# Patient Record
Sex: Female | Born: 1950 | Race: White | Hispanic: No | Smoking: Never smoker
Health system: Southern US, Academic
[De-identification: ages and names within clinical notes are randomized; demographics above are authoritative.]

## PROBLEM LIST (undated history)

## (undated) DIAGNOSIS — N200 Calculus of kidney: Secondary | ICD-10-CM

## (undated) DIAGNOSIS — M129 Arthropathy, unspecified: Secondary | ICD-10-CM

## (undated) DIAGNOSIS — F319 Bipolar disorder, unspecified: Secondary | ICD-10-CM

## (undated) DIAGNOSIS — F32A Depression, unspecified: Secondary | ICD-10-CM

## (undated) DIAGNOSIS — Z972 Presence of dental prosthetic device (complete) (partial): Secondary | ICD-10-CM

## (undated) DIAGNOSIS — K279 Peptic ulcer, site unspecified, unspecified as acute or chronic, without hemorrhage or perforation: Secondary | ICD-10-CM

## (undated) HISTORY — PX: COLECTOMY PARTIAL / TOTAL: SUR275

## (undated) HISTORY — PX: ABDOMINAL HYSTERECTOMY: SUR658

## (undated) HISTORY — PX: LITHOTRIPSY: SUR834

## (undated) HISTORY — PX: HX BREAST BIOPSY: SHX20

## (undated) HISTORY — PX: HX CYST REMOVAL: SHX22

## (undated) HISTORY — PX: ANKLE SURGERY: SHX546

## (undated) HISTORY — PX: ABDOMINAL HERNIA REPAIR: SHX539

## (undated) HISTORY — PX: HX CARPAL TUNNEL RELEASE: SHX101

---

## 2007-10-15 ENCOUNTER — Emergency Department (HOSPITAL_COMMUNITY): Payer: Self-pay | Admitting: EXTERNAL

## 2021-06-03 ENCOUNTER — Emergency Department
Admission: EM | Admit: 2021-06-03 | Discharge: 2021-06-03 | Disposition: A | Discharge status: Home / Self Care | Payer: Medicare Other

## 2021-06-03 ENCOUNTER — Other Ambulatory Visit: Payer: Self-pay

## 2021-06-03 ENCOUNTER — Encounter (HOSPITAL_BASED_OUTPATIENT_CLINIC_OR_DEPARTMENT_OTHER): Payer: Self-pay

## 2021-06-03 DIAGNOSIS — M5440 Lumbago with sciatica, unspecified side: Secondary | ICD-10-CM

## 2021-06-03 DIAGNOSIS — M5441 Lumbago with sciatica, right side: Secondary | ICD-10-CM | POA: Insufficient documentation

## 2021-06-03 DIAGNOSIS — G8929 Other chronic pain: Secondary | ICD-10-CM

## 2021-06-03 DIAGNOSIS — M5442 Lumbago with sciatica, left side: Secondary | ICD-10-CM

## 2021-06-03 HISTORY — DX: Peptic ulcer, site unspecified, unspecified as acute or chronic, without hemorrhage or perforation: K27.9

## 2021-06-03 HISTORY — DX: Bipolar disorder, unspecified: F31.9

## 2021-06-03 HISTORY — DX: Depression, unspecified: F32.A

## 2021-06-03 MED ORDER — TIZANIDINE 2 MG CAPSULE
2.0000 mg | ORAL_CAPSULE | Freq: Three times a day (TID) | ORAL | 0 refills | Status: DC | PRN
Start: 2021-06-03 — End: 2022-01-28

## 2021-06-03 MED ORDER — DEXAMETHASONE SODIUM PHOSPHATE (PF) 10 MG/ML INJECTION SOLUTION
6.0000 mg | INTRAMUSCULAR | Status: AC
Start: 2021-06-03 — End: 2021-06-03
  Administered 2021-06-03: 6 mg via INTRAMUSCULAR

## 2021-06-03 MED ORDER — DEXAMETHASONE SODIUM PHOSPHATE 4 MG/ML INJECTION SOLUTION
INTRAMUSCULAR | Status: AC
Start: 2021-06-03 — End: 2021-06-03
  Filled 2021-06-03: qty 3

## 2021-06-03 MED ORDER — KETOROLAC 30 MG/ML (1 ML) INJECTION SOLUTION
30.0000 mg | INTRAMUSCULAR | Status: AC
Start: 2021-06-03 — End: 2021-06-03
  Administered 2021-06-03: 30 mg via INTRAMUSCULAR

## 2021-06-03 MED ORDER — KETOROLAC 30 MG/ML (1 ML) INJECTION SOLUTION
INTRAMUSCULAR | Status: AC
Start: 2021-06-03 — End: 2021-06-03
  Filled 2021-06-03: qty 1

## 2021-06-03 NOTE — Discharge Instructions (Signed)
As we have discussed, follow-up with your PCP as needed.  Take Tylenol or ibuprofen every 4-6 hours as needed for pain.  Take prescription med as directed. If your symptoms get worse or no improvement, visit nearest ED.

## 2021-06-03 NOTE — ED Triage Notes (Signed)
x10 days patient has had lower back pain going down legs bilaterally. Reports talking motrin without any relief. Muscle in legs tight and painful. Reports falling x3 in the last five days r/t muscle tightness and discomfort. Patient also reported she has been out of her routine medication for over a month. She is waiting to get in with new dr on 06/23/21. Marland Kitchen

## 2021-06-03 NOTE — ED Nurses Note (Signed)
Patient was not in room at this time. NP has talked to patient about discharge plan. She did not get any paperwork.

## 2021-06-03 NOTE — ED Provider Notes (Signed)
[facility], [facility] Emergency Department  ED Primary Provider Note  History of Present Illness   Chief Complaint   Patient presents with   . Low Back Pain     TYMIA BUCHE is a 71 y.o. female who had concerns including Low Back Pain.  Arrival: The patient arrived by Car    A 70 years old female patient presents to the ED with a complaint of chronic back pain.  Patient states left-sided low back pain radiated to the bilateral knee and it is sharp pain.  Patient states it has been worse the last 5 days.  Patient states walking and standing make it worse and lying down make it better.  Patient refused have a x-ray.  Patient denies other symptoms or complaints at this time.  No acute distress at this time.        Review of Systems   Pertinent positive and negative ROS as per HPI.  Review of Systems   Constitutional: Negative.    HENT: Negative.    Eyes: Negative.    Respiratory: Negative.    Cardiovascular: Negative.    Gastrointestinal: Negative.    Genitourinary: Negative.    Musculoskeletal: Positive for back pain.   Skin: Negative.    Neurological: Negative.    Psychiatric/Behavioral: Negative.      Historical Data   History Reviewed This Encounter: Medical History  Surgical History  Family History  Social History      Physical Exam   ED Triage Vitals [06/03/21 1638]   BP (Non-Invasive) (!) 148/96   Heart Rate 88   Respiratory Rate 18   Temperature 36.8 C (98.3 F)   SpO2 97 %   Weight 90.7 kg (200 lb)   Height 1.575 m (5\' 2" )     Physical Exam  Vitals and nursing note reviewed.   Constitutional:       Appearance: Normal appearance. She is obese.   HENT:      Head: Normocephalic and atraumatic.   Eyes:      Extraocular Movements: Extraocular movements intact.      Pupils: Pupils are equal, round, and reactive to light.   Cardiovascular:      Rate and Rhythm: Normal rate.      Pulses: Normal pulses.      Heart sounds: Normal heart sounds.   Pulmonary:      Effort: Pulmonary  effort is normal.      Breath sounds: Normal breath sounds.   Abdominal:      General: Bowel sounds are normal.      Palpations: Abdomen is soft.   Musculoskeletal:         General: Tenderness present.      Cervical back: Normal range of motion and neck supple.      Comments: Mild tenderness on the left lower back   Skin:     General: Skin is warm and dry.   Neurological:      General: No focal deficit present.      Mental Status: She is alert and oriented to person, place, and time.   Psychiatric:         Mood and Affect: Mood normal.         Behavior: Behavior normal.       Patient Data   Labs Ordered/Reviewed - No data to display  No orders to display     Medical Decision Making        Medical Decision Making  Patient  has chronic low back pain radiates to bilateral knee.  Patient denies any injury.  Patient refused to have x-ray.  Patient probably lumbago with sciatic pain.  No acute abnormality at this time patient is stable and has normal vital signs.  Patient will have a muscle relaxer and NSAID at home.    Risk  Prescription drug management.               Medications Administered in the ED   ketorolac (TORADOL) 30 mg/mL injection (has no administration in time range)   dexamethasone (PF) 10 mg/mL injection (has no administration in time range)     Clinical Impression   Lumbago with sciatica (Primary)   Chronic low back pain with bilateral sciatica       Disposition: Discharged

## 2021-06-14 ENCOUNTER — Inpatient Hospital Stay
Admission: RE | Admit: 2021-06-14 | Discharge: 2021-06-20 | DRG: 885 | Disposition: A | Discharge status: Home / Self Care | Payer: Medicare Other | Source: Ambulatory Visit | Attending: Psychiatry | Admitting: Psychiatry

## 2021-06-14 ENCOUNTER — Encounter (HOSPITAL_PSYCHIATRIC): Payer: Self-pay

## 2021-06-14 ENCOUNTER — Encounter (HOSPITAL_BASED_OUTPATIENT_CLINIC_OR_DEPARTMENT_OTHER): Payer: Self-pay

## 2021-06-14 ENCOUNTER — Encounter (HOSPITAL_COMMUNITY): Payer: Self-pay

## 2021-06-14 ENCOUNTER — Other Ambulatory Visit: Payer: Self-pay

## 2021-06-14 ENCOUNTER — Encounter (HOSPITAL_PSYCHIATRIC): Payer: Self-pay | Admitting: Psychiatry

## 2021-06-14 ENCOUNTER — Emergency Department (EMERGENCY_DEPARTMENT_HOSPITAL)
Admission: EM | Admit: 2021-06-14 | Discharge: 2021-06-14 | Disposition: A | Discharge status: Other Acute Inpatient Hospital | Payer: Medicare Other | Source: Home / Self Care | Attending: Emergency Medicine | Admitting: Emergency Medicine

## 2021-06-14 ENCOUNTER — Emergency Department (EMERGENCY_DEPARTMENT_HOSPITAL): Payer: Medicare Other

## 2021-06-14 DIAGNOSIS — Z8711 Personal history of peptic ulcer disease: Secondary | ICD-10-CM

## 2021-06-14 DIAGNOSIS — W19XXXA Unspecified fall, initial encounter: Secondary | ICD-10-CM | POA: Insufficient documentation

## 2021-06-14 DIAGNOSIS — I10 Essential (primary) hypertension: Secondary | ICD-10-CM | POA: Diagnosis present

## 2021-06-14 DIAGNOSIS — K219 Gastro-esophageal reflux disease without esophagitis: Secondary | ICD-10-CM | POA: Diagnosis present

## 2021-06-14 DIAGNOSIS — M25562 Pain in left knee: Secondary | ICD-10-CM | POA: Insufficient documentation

## 2021-06-14 DIAGNOSIS — M1990 Unspecified osteoarthritis, unspecified site: Secondary | ICD-10-CM

## 2021-06-14 DIAGNOSIS — Z9151 Personal history of suicidal behavior: Secondary | ICD-10-CM | POA: Insufficient documentation

## 2021-06-14 DIAGNOSIS — R45851 Suicidal ideations: Secondary | ICD-10-CM | POA: Diagnosis present

## 2021-06-14 DIAGNOSIS — M25561 Pain in right knee: Secondary | ICD-10-CM | POA: Insufficient documentation

## 2021-06-14 DIAGNOSIS — R9431 Abnormal electrocardiogram [ECG] [EKG]: Secondary | ICD-10-CM | POA: Insufficient documentation

## 2021-06-14 DIAGNOSIS — R296 Repeated falls: Secondary | ICD-10-CM

## 2021-06-14 DIAGNOSIS — G8929 Other chronic pain: Secondary | ICD-10-CM | POA: Insufficient documentation

## 2021-06-14 DIAGNOSIS — Z62820 Parent-biological child conflict: Secondary | ICD-10-CM | POA: Diagnosis present

## 2021-06-14 DIAGNOSIS — R946 Abnormal results of thyroid function studies: Secondary | ICD-10-CM | POA: Diagnosis present

## 2021-06-14 DIAGNOSIS — Z9181 History of falling: Secondary | ICD-10-CM

## 2021-06-14 DIAGNOSIS — M069 Rheumatoid arthritis, unspecified: Secondary | ICD-10-CM | POA: Diagnosis present

## 2021-06-14 DIAGNOSIS — M25551 Pain in right hip: Secondary | ICD-10-CM | POA: Insufficient documentation

## 2021-06-14 DIAGNOSIS — Z79890 Hormone replacement therapy (postmenopausal): Secondary | ICD-10-CM

## 2021-06-14 DIAGNOSIS — S060XAA Concussion with loss of consciousness status unknown, initial encounter: Secondary | ICD-10-CM | POA: Insufficient documentation

## 2021-06-14 DIAGNOSIS — Z818 Family history of other mental and behavioral disorders: Secondary | ICD-10-CM

## 2021-06-14 DIAGNOSIS — M25552 Pain in left hip: Secondary | ICD-10-CM | POA: Insufficient documentation

## 2021-06-14 DIAGNOSIS — Z658 Other specified problems related to psychosocial circumstances: Secondary | ICD-10-CM

## 2021-06-14 DIAGNOSIS — F319 Bipolar disorder, unspecified: Principal | ICD-10-CM | POA: Diagnosis present

## 2021-06-14 DIAGNOSIS — F32A Depression, unspecified: Secondary | ICD-10-CM | POA: Insufficient documentation

## 2021-06-14 DIAGNOSIS — S0083XA Contusion of other part of head, initial encounter: Secondary | ICD-10-CM

## 2021-06-14 DIAGNOSIS — Z9049 Acquired absence of other specified parts of digestive tract: Secondary | ICD-10-CM

## 2021-06-14 DIAGNOSIS — F411 Generalized anxiety disorder: Secondary | ICD-10-CM

## 2021-06-14 DIAGNOSIS — S0990XA Unspecified injury of head, initial encounter: Secondary | ICD-10-CM

## 2021-06-14 DIAGNOSIS — K279 Peptic ulcer, site unspecified, unspecified as acute or chronic, without hemorrhage or perforation: Secondary | ICD-10-CM

## 2021-06-14 DIAGNOSIS — Z881 Allergy status to other antibiotic agents status: Secondary | ICD-10-CM

## 2021-06-14 DIAGNOSIS — F332 Major depressive disorder, recurrent severe without psychotic features: Secondary | ICD-10-CM

## 2021-06-14 DIAGNOSIS — Z87892 Personal history of anaphylaxis: Secondary | ICD-10-CM

## 2021-06-14 HISTORY — DX: Presence of dental prosthetic device (complete) (partial): Z97.2

## 2021-06-14 HISTORY — DX: Arthropathy, unspecified: M12.9

## 2021-06-14 LAB — CBC WITH DIFF
BASOPHIL #: 0.04 10*3/uL (ref 0.00–2.50)
BASOPHIL %: 1 % (ref 0–3)
EOSINOPHIL #: 0.47 10*3/uL (ref 0.00–2.40)
EOSINOPHIL %: 5 % (ref 0–7)
HCT: 42.2 % (ref 37.0–47.0)
HGB: 14.6 g/dL (ref 12.5–16.0)
LYMPHOCYTE #: 2.03 10*3/uL — ABNORMAL LOW (ref 2.10–11.00)
LYMPHOCYTE %: 24 % — ABNORMAL LOW (ref 25–45)
MCH: 32.1 pg — ABNORMAL HIGH (ref 27.0–32.0)
MCHC: 34.6 g/dL (ref 32.0–36.0)
MCV: 93 fL (ref 78.0–99.0)
MONOCYTE #: 0.89 10*3/uL (ref 0.00–4.10)
MONOCYTE %: 10 % (ref 0–12)
MPV: 6.9 fL — ABNORMAL LOW (ref 7.4–10.4)
NEUTROPHIL #: 5.21 10*3/uL (ref 4.10–29.00)
NEUTROPHIL %: 60 % (ref 40–76)
PLATELETS: 262 10*3/uL (ref 140–440)
RBC: 4.54 10*6/uL (ref 4.20–5.40)
RDW: 15.9 % — ABNORMAL HIGH (ref 11.6–14.8)
WBC: 8.6 10*3/uL (ref 4.0–10.5)

## 2021-06-14 LAB — URINALYSIS, MACRO/MICRO
BILIRUBIN: NEGATIVE mg/dL
BLOOD: NEGATIVE mg/dL
GLUCOSE: NEGATIVE mg/dL
KETONES: NEGATIVE mg/dL
LEUKOCYTES: NEGATIVE WBCs/uL
NITRITE: NEGATIVE
PH: 6 (ref 4.6–8.0)
PROTEIN: NEGATIVE mg/dL
SPECIFIC GRAVITY: <=1.005 (ref 1.003–1.035)
UROBILINOGEN: 0.2 mg/dL (ref 0.2–1.0)

## 2021-06-14 LAB — ECG 12 LEAD
Atrial Rate: 64 {beats}/min
Calculated P Axis: 7 degrees
Calculated R Axis: -19 degrees
Calculated T Axis: -4 degrees
PR Interval: 164 ms
QRS Duration: 86 ms
QT Interval: 434 ms
QTC Calculation: 447 ms
Ventricular rate: 64 {beats}/min

## 2021-06-14 LAB — COMPREHENSIVE METABOLIC PANEL, NON-FASTING
ALBUMIN/GLOBULIN RATIO: 0.9 (ref 0.8–1.4)
ALBUMIN: 3.8 g/dL (ref 3.4–5.0)
ALKALINE PHOSPHATASE: 82 U/L (ref 46–116)
ALT (SGPT): 15 U/L (ref <=78)
ANION GAP: 13 mmol/L (ref 10–20)
AST (SGOT): 22 U/L (ref 15–37)
BILIRUBIN TOTAL: 0.5 mg/dL (ref 0.2–1.0)
BUN/CREA RATIO: 14
BUN: 13 mg/dL (ref 7–18)
CALCIUM, CORRECTED: 9.5 mg/dL
CALCIUM: 9.3 mg/dL (ref 8.5–10.1)
CHLORIDE: 104 mmol/L (ref 98–107)
CO2 TOTAL: 26 mmol/L (ref 21–32)
CREATININE: 0.96 mg/dL (ref 0.55–1.02)
ESTIMATED GFR: 64 mL/min/{1.73_m2} (ref >59)
GLOBULIN: 4.1
GLUCOSE: 100 mg/dL (ref 74–106)
OSMOLALITY, CALCULATED: 285 mOsm/kg (ref 270–290)
POTASSIUM: 3.4 mmol/L — ABNORMAL LOW (ref 3.5–5.1)
PROTEIN TOTAL: 7.9 g/dL (ref 6.4–8.2)
SODIUM: 143 mmol/L (ref 136–145)

## 2021-06-14 LAB — URINE DRUG SCREEN
AMPHETAMINES URINE: NEGATIVE
BARBITURATES URINE: NEGATIVE
BENZODIAZEPINES URINE: NEGATIVE
CANNABINOIDS URINE: NEGATIVE
COCAINE METABOLITES URINE: NEGATIVE
METHADONE URINE: NEGATIVE
OPIATES URINE: NEGATIVE
PCP URINE: NEGATIVE

## 2021-06-14 LAB — SALICYLATE ACID LEVEL: SALICYLATE LEVEL: 0 mg/dL — ABNORMAL LOW (ref 3–20)

## 2021-06-14 LAB — ACETAMINOPHEN LEVEL: ACETAMINOPHEN LEVEL: 0 ug/mL

## 2021-06-14 LAB — THYROID STIMULATING HORMONE (SENSITIVE TSH): TSH: 7.3 u[IU]/mL — ABNORMAL HIGH (ref 0.358–3.740)

## 2021-06-14 LAB — ETHANOL, SERUM: ETHANOL: 1 mg/dL — ABNORMAL LOW (ref 3–202)

## 2021-06-14 MED ORDER — ALPRAZOLAM 0.5 MG TABLET
0.5000 mg | ORAL_TABLET | Freq: Two times a day (BID) | ORAL | Status: DC
Start: 2021-06-14 — End: 2021-06-20
  Administered 2021-06-14 – 2021-06-20 (×12): 0.5 mg via ORAL
  Filled 2021-06-14 (×12): qty 1

## 2021-06-14 MED ORDER — KETOROLAC 30 MG/ML (1 ML) INJECTION SOLUTION
15.0000 mg | INTRAMUSCULAR | Status: AC
Start: 2021-06-14 — End: 2021-06-14
  Administered 2021-06-14: 15 mg via INTRAMUSCULAR

## 2021-06-14 MED ORDER — BACLOFEN 10 MG TABLET
5.0000 mg | ORAL_TABLET | Freq: Three times a day (TID) | ORAL | Status: DC | PRN
Start: 2021-06-14 — End: 2021-06-20
  Administered 2021-06-14 – 2021-06-19 (×8): 5 mg via ORAL
  Filled 2021-06-14 (×8): qty 1

## 2021-06-14 MED ORDER — TRAZODONE 50 MG TABLET
50.0000 mg | ORAL_TABLET | Freq: Every evening | ORAL | Status: DC | PRN
Start: 2021-06-14 — End: 2021-06-20
  Administered 2021-06-14 – 2021-06-19 (×9): 50 mg via ORAL
  Filled 2021-06-14 (×9): qty 1

## 2021-06-14 MED ORDER — MAGNESIUM HYDROXIDE 2,400 MG/10 ML ORAL SUSPENSION
10.0000 mL | Freq: Every evening | ORAL | Status: DC | PRN
Start: 2021-06-14 — End: 2021-06-20

## 2021-06-14 MED ORDER — ALUMINUM-MAG HYDROXIDE-SIMETHICONE 200 MG-200 MG-20 MG/5 ML ORAL SUSP
30.0000 mL | ORAL | Status: DC | PRN
Start: 2021-06-14 — End: 2021-06-20

## 2021-06-14 MED ORDER — LEVOTHYROXINE 50 MCG TABLET
50.0000 ug | ORAL_TABLET | Freq: Every morning | ORAL | Status: DC
Start: 2021-06-14 — End: 2021-06-20
  Administered 2021-06-14 – 2021-06-20 (×7): 50 ug via ORAL
  Filled 2021-06-14 (×7): qty 1

## 2021-06-14 MED ORDER — KETOROLAC 30 MG/ML (1 ML) INJECTION SOLUTION
INTRAMUSCULAR | Status: AC
Start: 2021-06-14 — End: 2021-06-14
  Filled 2021-06-14: qty 1

## 2021-06-14 MED ORDER — TIZANIDINE 2 MG CAPSULE
2.0000 mg | ORAL_CAPSULE | Freq: Three times a day (TID) | ORAL | Status: DC | PRN
Start: 2021-06-14 — End: 2021-06-14

## 2021-06-14 MED ORDER — CITALOPRAM 10 MG TABLET
10.0000 mg | ORAL_TABLET | Freq: Every day | ORAL | Status: DC
Start: 2021-06-14 — End: 2021-06-20
  Administered 2021-06-14 – 2021-06-20 (×7): 10 mg via ORAL
  Filled 2021-06-14 (×7): qty 1

## 2021-06-14 MED ORDER — ACETAMINOPHEN 325 MG TABLET
650.0000 mg | ORAL_TABLET | ORAL | Status: DC | PRN
Start: 2021-06-14 — End: 2021-06-20
  Administered 2021-06-14 – 2021-06-19 (×7): 650 mg via ORAL
  Filled 2021-06-14 (×8): qty 2

## 2021-06-14 NOTE — Care Plan (Signed)
Care plan initiated.

## 2021-06-14 NOTE — Nurses Notes (Signed)
Patient arrived to GERI Unit at this time. Admission assessment completed. Patient noted to have crying spells during admission assessment, stating depression is a 9-10/10. Patient rated anxiety 8/10. Patient stated she has had an increase in suicidal thoughts, but patient denies current suicidal thoughts. Patient denies HI/AVH. Patient reports a recent increase in falls. Yellowing bruises noted to face above right eye, left cheek, and left forearm. See avatar for precise locations. Patient oriented to unit and rules. Q74min and PRN visual safety checks in progress.

## 2021-06-14 NOTE — Behavioral Health (Signed)
[facility]      Biopsychosocial Assessment    Patient: Brooke Maxwell, Brooke Maxwell, 70 y.o., Brooke Maxwell, female  Date of Birth:  [birth year]  MRN: [id number]  Room/Bed: 216/A  Medical Record #: [id number]  Date of Admission: 06/14/2021  Admission Type: Voluntary  Status:  Voluntary    Medical Diagnosis and History:  Patient Active Problem List   Diagnosis   . Bipolar 1 disorder (CMS HCC)     Past Medical History:   Diagnosis Date   . Arthropathy    . Bipolar disorder, unspecified (CMS HCC)    . Depression    . PUD (peptic ulcer disease)    . Wears dentures          Past Surgical History:   Procedure Laterality Date   . ABDOMINAL HERNIA REPAIR     . ABDOMINAL HYSTERECTOMY     . ANKLE SURGERY Right    . COLECTOMY PARTIAL / TOTAL     . HX BREAST BIOPSY Left    . HX CARPAL TUNNEL RELEASE Bilateral    . HX CYST REMOVAL      Cyst removed from neck   . LITHOTRIPSY               Date Performed: 06/14/2021    Attending Physician:  Dr. Farris HasJeffry Gee, MD/ Jeri CosIra T. Hyman HopesWebb, [place]     Informant: Information was gathered from the patient and clinical record    Chief Complaint / Problems:   Patient is a 71 y.o. White female who is divorced and single.  Brooke Maxwell is being admitted voluntarily.   The patient is english speaking and no communication devices were needed during this assessment.  The patient was admitted for suicidal ideations, increased depression and anxiety with an inability to cope.  The patient has a long history of mental health issues but has had no treatment since moving to [place] 3 years ago with her daughter.    Severity of Stressors and Maladaptive Behavior identified:    Brooke Maxwell rates her depression an 8 and her anxiety a 9 on a scale of 1-10 with 10 being the worst.  The patient reports she does have panic attacks but denies feeling paranoid.  The patient reports her thoughts race constantly, her sleep is very poor because she wakes frequently and her appetite is poor.  The patient is still endorsing  suicidal ideations stating she wishes she would go to sleep and just not wake up.  Patient currently denies homicidal ideations, auditory, visual and tactile hallucinations.  The patient has not established with any outpatient psychiatric provider.  The only provider the patient currently has is her primary care physician a Dr. Noe GensPeters at [facility] in [place].  The patient was made aware of her right to participate in treatment.      CURRENT FAMILY SITUATION AND PEER GROUP  The patient currently lives in [place], [place].  The patient currently lives in her daughter's residence where it is the patient, her daughter and her 2 grandchildren.  The patient does not identify with any peer groups as she states the only family she has is her daughter currently.  The patient states she does not have any friends.  The patient has been married twice in the past.  The patient reports her 1st marriage was for 11 years in her 2nd marriage was for 25 years.  The patient did not comment  on the quality of the relationships.  The patient currently denies any adult abuse, neglect or exploitation and denies abusing anyone else.  However when the financials were discussed with the patient she states she only gets her disability check and her daughter takes the entire check to pay the bills at the residence.  The patient also states her daughter frequently tells the patient she needs to get a part-time job to bring in more money for the household.  So there possibly could be some financial exploitation of the patient.  The patient reports she only has 1 child which is her adult daughter she currently lives with.  The patient states she is trying to maintain the relationship with her daughter but it is significantly strained.  Patient reports her mother and father both deceased.  The patient reports she is the youngest that she has 3 older brothers.  The patient reports 2 of  her brothers are deceased.  The patient reports she does not have a relationship with her only brother that is currently living.  The social, ethnic, cultural, emotional and/or health factors currently stressing the patient and her family are:  The turmoil in the household, the financial situation in the patient's inability to cope with her worsening mental health issues and physical health issues.    Sociocultural Environment and Severity of Stress.   The patient reports that she has no religious preference.  The patient's has level of education is she graduated high school and has 1 year of college.  The patient has no history of [employer].  The patient is currently retired and disabled.  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  The patient rates her financial status is very difficult because she has a fixed income of her disability, and she states her daughter takes her entire check to pay the bills at the daughter's residence.  The patient states she does not see 1 penny of her income because her daughter takes at all, and her daughter frequently tells her to get a part-time job during more income into the household.  The patient does not have access to community resources as she states she is not eligible for Medicaid or food stamps.  The patient does receive Medicare.  The patient has no support system and is dependent upon her daughter who she lives with which is a significant barrier for her.  The patient denies any history of gambling, current legal charges or any legal history.    Tobacco/Chemical Substance History  The patient denies using alcohol, substances or tobacco products.  Nothing in the patient's record indicates a problem with substances.  Therefore, no further intervention is needed at this time.  Please see the dictated H&P for full listing of the patient's medical issues.    Substance Abuse Treatment History:  The  patient denies any history of seizures, blackouts or DTs.  The patient denies any history of chemical dependence treatment, rehab her support groups.    Developmental History:  The patient was born in [place], [place].  The patient rates her developmental memories of childhood is positive.  The patient denies any history of physical, psychological or sexual abuse as a child.  The patient denies any unusual childhood illnesses, injuries or diseases.    Activity Assessment and Discharge Recommendations:  The patient reports she currently has no hobbies or interests.  The patient reports her level of activity is very limited due  to physical impairment and pain as well as no interest or joy in things.  The patient struggles with depression and anxiety and would benefit from activities that would improve her mood and affect.  The patient also struggles with suicidal ideations and needs activities that would provide her with positive outlets for her feelings and emotions.  The patient also has pain issues and would benefit from activities that would help direct her thoughts away from her pain.    Primary Therapist Assessment:  The patient was rational and coherent during the assessment.  There are no overt signs of psychosis.  The patient was admitted for suicidal ideations and is still endorsing thoughts of just wanting to go to sleep and not wanting to wake up.  The patient cried during her assessment.  The patient denies homicidal ideations, auditory, visual and tactile hallucinations.  The patient's mood and affect are depressed and flat.  The patient's behavior is cooperative but she was very tearful and cried.  The patient is dressed in street clothes but appears disheveled and unkept.  The patient has also had issues with falling recently and has several bruises on her face from recent falls.  The patient also struggles with significant arthritis and pain issues which limit her mobility from time to times.  The  patient's tone of voice was soft.  The patient made good eye contact during the assessment.  The patient's estimated intelligence level is average.  The patient's insight and judgment are fair.  There is no issues with recent or remote memories.  There is no evidence of historical psychosis or delusional thinking.  Patient self-esteem and ego strength are poor.  The patient's strength are that she does have her own insurance and her only income, but she currently does not have access to her actual income because her daughter takes it.  The patient's weaknesses are support system, coping skills, financial situation, housing situation and family turmoil.    Plan:  While on the unit, the patient will be involved in individual, group, activity, psychoactive and milieu therapies in order to reduce their symptoms to a manageable, functional level.  The patient is interested in the counselor facilitating a family conference with her daughter.  The patient's follow-up in aftercare will be scheduled with her primary care physician as this is the only provider she is currently established with.  This is Dr. Noe Gens at [facility] in [place], [place].     Discharge Plan:    Brooke Maxwell is unsure where she will return to at discharge.  The patient states her daughter told her she was not allowed to come back to her residence.  However, the patient indicates it is her disability income that pays the bills at her daughter's residence.  The patient also states her daughter said at discharge she was taking the patient to the [facility] for help with housing.  Outpatient services will be scheduled and patient will be encouraged to participate and follow through with outpatient treatment.       Safety Plan:  Safety plan given to patient to be discussed and completed with counselor prior to discharge from facility.     Transportation:  Upon discharge, Brooke Maxwell will  need assistance with transportation.

## 2021-06-14 NOTE — ED Nurses Note (Signed)
Megan at the [facility] notified of patient referral

## 2021-06-14 NOTE — ED Nurses Note (Signed)
[facility] here for tranport to [facility]. VSS

## 2021-06-14 NOTE — ED Nurses Note (Signed)
Pt is currently on phone with the [facility] for phone interview

## 2021-06-14 NOTE — Nurses Notes (Signed)
Patient denies SI/HI and A/V hallucinations. She rates her anxiety 8/10 and depression 9/10 at this time. She has been withdrawn to her room most of the day. She has been pleasant and cooperative. She took meds without difficulty. Visual checks Q 15 minutes and prn. Will document any changes.

## 2021-06-14 NOTE — Consults (Signed)
[place] MEDICINE [facility]  Hospitalist Consultation    Date of Service:  06/14/2021  Brooke Maxwell   71 y.o. female  Date of Admission:  06/14/2021  Date of Birth:  11/08/1950  0         History of Present Illness:  Patient is a 71 year old Caucasian female.  She states she has a history of osteoarthritis, depression, peptic ulcer disease.  She states she fell about a week ago when her legs gave out and she hit the floor bruising her face.  She did not seek medical attention as she did not think the injury was serious.  She stated the bruise was noticed later and she does has a little soreness around her mouth and forehead at this time.  Labs revealed she had an elevated TSH of 7.3.  She was unaware of any history of thyroid issues.  Patient was pleasant and cooperative with me.  Fair historian.                    History:    Past Medical:    Past Medical History:   Diagnosis Date   . Arthropathy    . Bipolar disorder, unspecified (CMS HCC)    . Depression    . PUD (peptic ulcer disease)    . Wears dentures       Past Surgical:    Past Surgical History:   Procedure Laterality Date   . ABDOMINAL HERNIA REPAIR     . ABDOMINAL HYSTERECTOMY     . ANKLE SURGERY Right    . COLECTOMY PARTIAL / TOTAL     . HX BREAST BIOPSY Left    . HX CARPAL TUNNEL RELEASE Bilateral    . HX CYST REMOVAL      Cyst removed from neck   . LITHOTRIPSY        Family:    Family Medical History:    None        Social:   reports that she has never smoked. She has never used smokeless tobacco. She reports that she does not drink alcohol and does not use drugs.     REVIEW OF SYSTEMS:  Negative if not mentioned in HPI  Allergies   Allergen Reactions   . Cefaclor Anaphylaxis       Medications:  Medications Prior to Admission     Prescriptions    tizanidine (ZANAFLEX) 2 mg Oral Capsule    Take 1 Capsule (2 mg total) by mouth Three times a day as needed        acetaminophen (TYLENOL) tablet, 650 mg, Oral, Q4H PRN  aluminum-magnesium  hydroxide-simethicone (MAG-AL PLUS) 200-200-20 mg per 5 mL oral liquid, 30 mL, Oral, Q4H PRN  levothyroxine (SYNTHROID) tablet, 50 mcg, Oral, QAM  magnesium hydroxide (MILK OF MAGNESIA) 2,400 mg per 10 mL oral suspension, 10 mL, Oral, HS PRN  traZODone (DESYREL) tablet, 50 mg, Oral, HS PRN - MR x 1        Physical Exam:  Vitals:  Temperature: 36.4 C (97.5 F)  Heart Rate: 88  Respiratory Rate: 19  BP (Non-Invasive): (!) 175/93  SpO2: 96 %    Exam:  Nursing note and vitals reviewed.   General: No acute distress, alert and oriented x3  HEENT: Pharynx without no erythema, exudate. PERRLA, conjunctivae are without injection or scleral icterus.  Facial bruising noted  Neck: Neck supple without lymphadenopathy. Thyroid non-tender and without nodules.  Cardio: Regular rate and rhythm. Normal  S1 and S2. No murmurs, gallops, or rubs. PMI located in the midclavicular line.   Resp: Clear to auscultation bilaterally. No wheezes, rales, rhonchi or crackles. Normal resp effort.  Abd: Bowel sounds present in all 4 quadrants. Negative murphy's sign. No rebound tenderness or involuntary guarding. Liver and spleen not palpable.  GU: Deferred exam  Extremities: No edema or swelling noted. No gait disturbances or weakness. Muscle strength 5/5 in bilateral upper and lower extremities.  Skin: No rashes, ulcers, or bruising.  Neuro: CN II - XII grossly intact  I: smell Not tested   II: visual acuity  Visual acuity appears normal bilaterally   II: visual fields Full to confrontation   II: pupils Equal, round, reactive to light   III,VII: ptosis none   III,IV,VI: extraocular muscles  Full ROM   V: mastication normal   V: facial light touch sensation  normal   V,VII: corneal reflex  present   VII: facial muscle function - upper  normal   VII: facial muscle function - lower normal   VIII: hearing Not tested   IX: soft palate elevation  normal   IX,X: gag reflex present   XI: trapezius strength  5/5   XI: sternocleidomastoid strength 5/5    XI: neck flexion strength  5/5   XII: tongue strength  normal      [employer]  [phone] [facility]  [address]                        Phone [phone]  [place], [place]  [address]      Fax: [phone]        Labs:     Results for orders placed or performed during the hospital encounter of 06/14/21 (from the past 24 hour(s))   ETHANOL, SERUM   Result Value Ref Range    ETHANOL 1 (L) 3 - 202 mg/dL   ACETAMINOPHEN LEVEL   Result Value Ref Range    ACETAMINOPHEN LEVEL 0 ug/mL   SALICYLATE ACID LEVEL   Result Value Ref Range    SALICYLATE LEVEL 0 (L) 3 - 20 mg/dL   COMPREHENSIVE METABOLIC PANEL, NON-FASTING   Result Value Ref Range    SODIUM 143 136 - 145 mmol/L    POTASSIUM 3.4 (L) 3.5 - 5.1 mmol/L    CHLORIDE 104 98 - 107 mmol/L    CO2 TOTAL 26 21 - 32 mmol/L    ANION GAP 13 10 - 20 mmol/L    BUN 13 7 - 18 mg/dL    CREATININE 6.57 8.46 - 1.02 mg/dL    BUN/CREA RATIO 14     ESTIMATED GFR 64 >59 mL/min/1.63m^2    ALBUMIN 3.8 3.4 - 5.0 g/dL    CALCIUM 9.3 8.5 - 96.2 mg/dL    GLUCOSE 952 74 - 841 mg/dL    ALKALINE PHOSPHATASE 82 46 - 116 U/L    ALT (SGPT) 15 <=78 U/L    AST (SGOT) 22 15 - 37 U/L    BILIRUBIN TOTAL 0.5 0.2 - 1.0 mg/dL    PROTEIN TOTAL 7.9 6.4 - 8.2 g/dL    ALBUMIN/GLOBULIN RATIO 0.9 0.8 - 1.4    OSMOLALITY, CALCULATED 285 270 - 290 mOsm/kg    CALCIUM, CORRECTED 9.5 mg/dL    GLOBULIN 4.1    THYROID STIMULATING HORMONE (SENSITIVE TSH)   Result Value Ref Range    TSH 7.300 (H) 0.358 - 3.740  uIU/mL   CBC WITH DIFF   Result Value Ref Range    WBCS UNCORRECTED      WBC 8.6 4.0 - 10.5 x10^3/uL    RBC 4.54 4.20 - 5.40 x10^6/uL    HGB 14.6 12.5 - 16.0 g/dL    HCT 16.1 09.6 - 04.5 %    MCV 93.0 78.0 - 99.0 fL    MCH 32.1 (H) 27.0 - 32.0 pg    MCHC 34.6 32.0 - 36.0 g/dL    RDW 40.9 (H) 81.1 - 14.8 %    PLATELETS 262 140 - 440 x10^3/uL    MPV 6.9 (L) 7.4 - 10.4 fL    NEUTROPHIL % 60 40 - 76 %    LYMPHOCYTE % 24 (L) 25 - 45 %    MONOCYTE %  10 0 - 12 %    EOSINOPHIL % 5 0 - 7 %    BASOPHIL % 1 0 - 3 %    NEUTROPHIL # 5.21 4.10 - 29.00 x10^3/uL    LYMPHOCYTE # 2.03 (L) 2.10 - 11.00 x10^3/uL    MONOCYTE # 0.89 0.00 - 4.10 x10^3/uL    EOSINOPHIL # 0.47 0.00 - 2.40 x10^3/uL    BASOPHIL # 0.04 0.00 - 2.50 x10^3/uL    RBC COMMENT slight toxic granulation    URINE DRUG SCREEN   Result Value Ref Range    AMPHETAMINES URINE Negative Negative    BARBITURATES URINE Negative Negative    BENZODIAZEPINES URINE Negative Negative    METHADONE URINE Negative Negative    COCAINE METABOLITES URINE Negative Negative    OPIATES URINE Negative Negative    PCP URINE Negative Negative    CANNABINOIDS URINE Negative Negative   URINALYSIS, MACRO/MICRO   Result Value Ref Range    COLOR Light Yellow Yellow, Colorless, Light Yellow, Dark Yellow    APPEARANCE Clear Clear    SPECIFIC GRAVITY <=1.005 1.003 - 1.035    PH 6.0 4.6 - 8.0    LEUKOCYTES Negative Negative WBCs/uL    NITRITE Negative Negative    PROTEIN Negative Negative mg/dL    GLUCOSE Negative Negative mg/dL    KETONES Negative Negative mg/dL    BILIRUBIN Negative Negative mg/dL    BLOOD Negative Negative mg/dL    UROBILINOGEN 0.2 0.2 - 1.0 mg/dL   ECG 12 LEAD   Result Value Ref Range    Ventricular rate 64 BPM    Atrial Rate 64 BPM    PR Interval 164 ms    QRS Duration 86 ms    QT Interval 434 ms    QTC Calculation 447 ms    Calculated P Axis 7 degrees    Calculated R Axis -19 degrees    Calculated T Axis -4 degrees       Imaging Studies:        ECG 12 LEAD  Normal sinus rhythm  Minimal voltage criteria for LVH, may be normal variant ( R in aVL )  Nonspecific ST abnormality  Abnormal ECG  No previous ECGs available  Confirmed by Haig Prophet ([id number]) on 06/14/2021 10:01:24 AM  CT BRAIN WO IV CONTRAST  FINAL REPORT  [facility] [place]    Patient Name: RHEGAN, TRUNNELL  Age: 84  Patient MR NO: [id number]  Date Of Birth: 1950/09/29  Referring Doctor:  Accession No: [id number]  Patient Location: Emergency Department          CLINICAL INDICATION: MULTIPLE RECENT FALLS  CONTRAST: No Contrast  PROCEDURE DATE: 06/14/2021  CT HEAD  INDICATION: Pain and injury.     COMPARISON: No relevant priors available     TECHNIQUE: Axial CT images of the head were obtained without IV contrast. Coronal and sagittal reformations were reviewed. CT scans are performed using dose optimization techniques as appropriate to a performed exam, including but not limited to one or more of the following: automated exposure control, adjustment of the mA and/or kV according to patient size, use of iterative reconstruction technique.     FINDINGS:  Gray-white differentiation is relatively preserved. No mass, mass effect or midline shift. No evidence of acute large territorial infarction or acute intracranial hemorrhage. Ventricles appear normal in size. Basal cisterns are patent. No depressed calvarial fracture.     IMPRESSION:  No acute intracranial process.  Patient SANARI, AKHTER  MR JS:[id number]  Accession [id number]    Lillie Fragmin D.O.  Electronically Signed    Date Of Exam Request:06/14/2021 2:37:10 AM EDT  Date & Time Of Report: 06/14/2021 2:42:57 AM EDT    Patient JESSAMAE, HESSEL  MR [id number]  Accession [id number]      Assessment/Plan:  Will start patient on levothyroxine 50 micrograms daily.  No further workup concerning her facial contusions that she had a negative CT.  I appreciate this interesting consultation will follow along with Psychiatry.    Problem List:  Active Hospital Problems   (*Primary Problem)    Diagnosis   . *Bipolar 1 disorder (CMS HCC)       DVT/PE Prophylaxis:  Early ambulation      Leonides Cave, PA-C  [facility] MEDICINE HOSPITALIST

## 2021-06-14 NOTE — Nurses Notes (Signed)
Called patients pharmacy-[facility] in [place], per pharmacist patient has not had any medications filled since November and did not pick up refills in January .

## 2021-06-14 NOTE — ED Nurses Note (Signed)
Pt resting with eyes closed, resp even and unlabored, no acute distress noted 

## 2021-06-14 NOTE — ED Nurses Note (Signed)
Report called to Phyllis Ginger at the [facility]

## 2021-06-14 NOTE — Group Note (Signed)
Name: Brooke Maxwell   Date of Birth: [birth year]   Today's Date: 06/14/2021   Group Start Time: 1300   Group End Time: 1320   Group Topic: ACTIVITY GROUP  Number of participants:5    Summary of group discussion:       Reminiscing through music therapy.       [name]'s Participation: Patient did not attend group
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
## 2021-06-14 NOTE — Care Plan (Signed)
Patient educated on unit rules and new medications started this shift, pleasant and cooperative and compliant with meds and treatments  Problem: Adult [place] of Care  Goal: Plan of Care Review  Outcome: Ongoing (see interventions/notes)  Goal: Patient-Specific Goal (Individualization)  Outcome: Ongoing (see interventions/notes)  Goal: Adheres to Safety Considerations for Self and Others  Outcome: Ongoing (see interventions/notes)  Goal: Absence of New-Onset Illness or Injury  Outcome: Ongoing (see interventions/notes)  Goal: Optimized Coping Skills in Response to Life Stressors  Outcome: Ongoing (see interventions/notes)  Goal: Develops/Participates in Therapeutic Alliance to Support Successful Transition  Outcome: Ongoing (see interventions/notes)  Goal: Rounds/Family Conference  Outcome: Ongoing (see interventions/notes)     Problem: Manic or Hypomanic Signs/Symptoms  Goal: Improved Impulse Control (Manic/Hypomanic Signs/Symptoms)  Outcome: Ongoing (see interventions/notes)  Goal: Optimized Cognitive Function (Manic/Hypomanic Signs/Symptoms)  Outcome: Ongoing (see interventions/notes)  Goal: Improved Mood Symptoms (Manic/Hypomanic Signs/Symptoms)  Outcome: Ongoing (see interventions/notes)  Goal: Optimized Nutrition Intake (Manic or Hypomanic Signs/Symptoms)  Outcome: Ongoing (see interventions/notes)  Goal: Improved Psychomotor Symptoms (Manic/Hypomanic Signs/Symptoms)  Outcome: Ongoing (see interventions/notes)  Goal: Improved Sleep (Manic/Hypomanic Signs/Symptoms)  Outcome: Ongoing (see interventions/notes)

## 2021-06-14 NOTE — Group Note (Signed)
Group topic:  WELLNESS GROUP / ILLNESS MANAGEMENT    Date of group:  06/14/2021  Start time of group:  1430  End time of group:  1530               Summary of group discussion: Recognizing your strengths and how this can help pick you up when you are struggling, provide you with  hope and motivation.  Discussed what the patient's viewed as their individual strengths and why.      Did not attend group    Alaiza Yau I Javien Tesch, MS  06/14/2021, 16:04

## 2021-06-14 NOTE — ED Provider Notes (Addendum)
[facility], [facility] Emergency Department  ED Primary Provider Note  HPI:  Brooke Maxwell is a 71 y.o. female     Patient complains of thoughts of hurting herself.  She has made no attempt.  She has no specific plan to hurt herself She is having these thoughts because she is in chronic pain primarily at her right knee bilateral hips. She has had recurrent falls from her knees giving out and she states he just feels off.  No patient was started on Zanaflex 3/4 with some may coincide with the started for falls.  She is also having interpersonal issues with her daughter who has threatened to kick her out of the house and she is worried that she has nowhere to go.  She denies chest pain shortness of breath loss of bowel or bladder control.  Patient is not on blood thinners.  Patient has had a prior psychiatric hospitalization following a intentional overdose about 7 years ago.  Patient is COVID vaccinated.  She is full code      ROS review and negative aside from stated in HPI.     Physical Exam:  ED Triage Vitals [06/14/21 0141]   BP (Non-Invasive) 111/80   Heart Rate 92   Respiratory Rate 16   Temperature 37.1 C (98.8 F)   SpO2 98 %   Weight 90.7 kg (200 lb)   Height 1.575 m (5' 2" )     No acute distress.  Patient awake alert oriented x3.  Patient is tearful depressed mood.  Pupils 3 mm equal round reactive.  Patient has bruising over her right forehead and over left jaw.  There is no trismus.  There is no cephalhematoma hemotympanum.  Negative Battle sign negative raccoon eyes no cervical spine tenderness.  Extraocular movements are intact.  Oropharynx is clear.  Mucous membranes moist.  Trachea midline neck is supple.  Heart has regular rate and rhythm without significant murmurs rubs or gallops.  Lungs are clear to auscultation.  Abdomen soft nontender nondistended.  Moving all extremities without difficulty.  No rash no edema.      Patient data:  Labs Ordered/Reviewed   ETHANOL,  SERUM - Abnormal; Notable for the following components:       Result Value    ETHANOL 1 (*)     All other components within normal limits   SALICYLATE ACID LEVEL - Abnormal; Notable for the following components:    SALICYLATE LEVEL 0 (*)     All other components within normal limits   COMPREHENSIVE METABOLIC PANEL, NON-FASTING - Abnormal; Notable for the following components:    POTASSIUM 3.4 (*)     All other components within normal limits    Narrative:     Estimated Glomerular Filtration Rate (eGFR) is calculated using the CKD-EPI (2021) equation, intended for patients 21 years of age and older. If gender is not documented or "unknown", there will be no eGFR calculation.   CBC WITH DIFF - Abnormal; Notable for the following components:    MCH 32.1 (*)     RDW 15.9 (*)     MPV 6.9 (*)     LYMPHOCYTE % 24 (*)     LYMPHOCYTE # 2.03 (*)     All other components within normal limits   URINE DRUG SCREEN - Normal   URINALYSIS, MACRO/MICRO - Normal   ACETAMINOPHEN LEVEL   CBC/DIFF    Narrative:     The following orders were created for panel order  CBC/DIFF.  Procedure                               Abnormality         Status                     ---------                               -----------         ------                     CBC WITH ONGE[[id number]]                Abnormal            Final result                 Please view results for these tests on the individual orders.   URINALYSIS WITH REFLEX MICROSCOPIC AND CULTURE IF POSITIVE    Narrative:     The following orders were created for panel order URINALYSIS WITH REFLEX MICROSCOPIC AND CULTURE IF POSITIVE.  Procedure                               Abnormality         Status                     ---------                               -----------         ------                     URINALYSIS, MACRO/MICRO[[id number]]      Normal              Final result                 Please view results for these tests on the individual orders.   THYROID STIMULATING HORMONE  (SENSITIVE TSH)     CT BRAIN WO IV CONTRAST   Final Result by Raynelle Jan, RT (R)(CT) (03/15 0325)        EKG read by me shows a sinus rhythm with a rate of 64, normal axis, QTC 447.  Somewhat poor baseline however I do not appreciate significant ST or T-wave changes no delta waves are S1 Q3 T3 pattern.      MDM:  Recurrent falls depression suicidality.  Broad DDx including intracranial hemorrhage, intracranial mass, electrolyte dysfunction hypoglycemia, deconditioning mics other pertinent pathology.  Vitals are grossly within normal limits.  CT imaging of the head did not reveal acute intracranial abnormality.  No facial fracture.  C-spine cleared by [place] C-spine rules.  Lab work is unremarkable.  TSH is pending at this time otherwise patient medically clear for further psychiatric evaluation.     0500 patient accepted to [facility] by Dr. Nolon Rod.    Psych Facility  Clinical Impression   Suicidal ideation (Primary)   Chronic pain   Recurrent falls   Closed head injury with concussion     Medications Administered in the ED   ketorolac (TORADOL) 30 mg/mL injection (15 mg IntraMUSCULAR Given 06/14/21  0323)        Current Discharge Medication List      CONTINUE these medications - NO CHANGES were made during your visit.      Details   tizanidine 2 mg Capsule  Commonly known as: ZANAFLEX   2 mg, Oral, 3 TIMES DAILY PRN  Qty: 15 Capsule  Refills: 0

## 2021-06-14 NOTE — Group Note (Signed)
Psychoeducational Group Note  Date of group:  06/14/2021  Start time of group:  1000  End time of group:  1100               Summary of group discussion:Drawl your 3 favorite animals    The counselor asked the group to draw the 3 favorite animals.  She then asked him to list 3 characteristics of each animal.  An example would be a [address], friendly, and productive.  The group then discussed what animals they drew him what their characteristics were.  The counselor then asked him to explore the option or the thought that perhaps all 3 of those animals represented something about each patient.  She explained that the first 1 is how he wanted everybody to see you.  She stated the second animal was how people actually saw you.  And the third 1 was who you really were.  The counselor explained that sometimes we get ourselves locked into these boxes and we think that we must always be the way other people see is or how we of always ourselves.    The counselor then asked him to explore the option if they took characteristics from all 3 animals and combine them together what would that animal be like.  She then asked them to draw a hybrid of the 3 favorite animals.  An example would be a dog, monkey, bird.  She asked them to list the characteristics that they would like to take from each animal with them into the new animal.  She then asked the patient's to think about what kind of friends these animals would have.  She also asked him to think about what will the animals new habitat look like or where they would live would look like for each new animal.    The patient's and the counselor explored the possibilities of what would happen if they were to do this with their selves and their lives.  They explored what would happen if they were to take how they wanted people to see them, how the people saw them, and who they really were and combine them into a new person.  She asked him to explore what their lives would look  like and what their friendships would look like from that point forward.  The counselor and the patient's explored the options and the opportunities that they could look forward to if they were to work on themselves and change himself to the way they would like to be seen just like the new animals.      Brooke Maxwell  s a 71 y.o. female participating in a psychoeducational group.        Comments:  Patient did not attend group    Abbey Chatters, [place] 06/14/2021 15:22

## 2021-06-14 NOTE — Group Note (Signed)
Name: Brooke Maxwell   Date of Birth: [birth year]   Today's Date: 06/14/2021   Group Start Time: 1540   Group End Time: 1620   Group Topic: ACTIVITY GROUP  Number of participants:6    Summary of group discussion:       Patients worked on Therapist, sports through art therapy      [name]'s Participation:Patient did not attend group

## 2021-06-14 NOTE — ED Nurses Note (Signed)
Assisted pt up to bsc to void, unsteady gait noted, pt states she uses a cane at home

## 2021-06-14 NOTE — ED Nurses Note (Signed)
Per MD orders pt medicated for pain in legs and hips with Toradol. Pain 7/10

## 2021-06-14 NOTE — ED Triage Notes (Signed)
EMS reports pt called EMS for suicidal thoughts without plan. Pt states she just "feels off." Has had multiple falls over the past week , bruises noted on the face.

## 2021-06-15 LAB — LIPID PANEL
CHOL/HDL RATIO: 4.4
CHOLESTEROL: 198 mg/dL (ref 136–290)
HDL CHOL: 45 mg/dL (ref 23–92)
LDL CALC: 120 mg/dL — ABNORMAL HIGH (ref 0–100)
TRIGLYCERIDES: 164 mg/dL — ABNORMAL HIGH (ref <=150)
VLDL CALC: 33 mg/dL (ref 0–50)

## 2021-06-15 LAB — HGA1C (HEMOGLOBIN A1C WITH EST AVG GLUCOSE): HEMOGLOBIN A1C: 5.7 % (ref 4.0–6.0)

## 2021-06-15 LAB — GLUCOSE, NON FASTING: GLUCOSE: 94 mg/dL (ref 74–106)

## 2021-06-15 LAB — TRIGLYCERIDE: TRIGLYCERIDES: 164 mg/dL — ABNORMAL HIGH (ref <=150)

## 2021-06-15 NOTE — Progress Notes (Signed)
Patient's case was discussed in treatment team and initial treatment plan formulated.  I certify that the inpatient psychiatric admission is medically necessary for (1) treatment which is reasonably expected to improve the patient's condition, and (2) for diagnostic studies.  See Nurse Practitioner/Physician Assistant report/notes.  NP/PA will be providing face to face services with daily attending physician supervision.

## 2021-06-15 NOTE — Group Note (Signed)
Psychoeducational Group Note  Date of group:  06/15/2021  Start time of group:  1000  End time of group:  1100               Summary of group discussion:HOW TO GET A BETTER NIGHT SLEEP  THE COUNSELRO WENT OVER A SHEET ON WAYS TO GET A BETTER NIGHT SLEEP. THE GROUP TALKED AOBUT HOW TO TAKE CARE OF YOUR BODY: SUCH AS EATING HEALTHY, NOT DRINKING CAFFINE AFTER 4:00PM, NOT EATING A BIG OR SPICY MEAL LATE IN THE EVENING, NOT GOING TO BED HUNGRY EITHER, AND TO AVOID ALCOHOL.     THEY THEN TALKED AOBUT PHYSICAL EXERCISE AND HOW IT HELPS YOUR BODY GET TIRED SO TO TRY AND DO SOME TYPE OF EXERCISES DAILY IN THE LATE AFTERNOON.   THEY ALSO TALKED AOBUT HOW YOU HAVE TO SLEEP ONLY AT NIGHT AND TO AVOID DAY TIME NAPS.     THEY THEN TALKED ABOUT HAVING A REGULAR BEDTIME ROUTINE: DRINKNING A SOOTHING DRINK AT NIGHT BEFORE BED, TAKING A WARM BATH OR SHOWER ECT.., GO TO BED AT THE SAME TIME EACH NIGHT, THINKING OF NICE THINGS IN BED, RELAXATION BREATHING, AND WAKING UP AT THE SAME TIME EACH DAY.   THE GROUP THEN TALKEDA BOUT BAD DREAM AND HOW TO DEAL WITH THEM SUCH AS: PREPARING YOURSELF INCASE YOU HAVE ONE TO RE-ORIENT YOURSELF OR WHAT TO DO IF YOU HAVE ONE, PUT A DAMP TOWEL ON YOUR NIGHT STAND TO WIPE YOUR FACE, HAVING SOMETHING THAT GIVES YOU COMFORT LIKE A STUFF TOY OR A PICTURE WHERE YOU CAN SEE IT OR TOUCH IT, USING SCENTS OR TEXTURES TO SHOW YOURSELF YOU ARE AWAKE AND NOT STILL DREAMING. AND MOVING YOUR BODY TO FULLY WAKE YOURSELF UP.     THEY THEN TALKED ABOUT MAKINGYOUR BEDROOM A RELAXING PLACE THAT IS SET UP FOR SLEEPING. SUCH AS GET A NIGHT LIGHT, KEEPING IT CLEAN AND TIDY, HAINVG SOOTHING SMEELS, USING AN EXTRA PILLOW, AND MAKING SURE YOU FEEL SAFE BY LOCKING DOORS AND WINDOWS ECT.    THEY THEN TALKED ABOUT HOW YOU SHOULE LAY IN BED AND TRY TO SLEEP FOR 30 MINUTES IF YOU DON'T GO TO SLEEP BY THEN YOU NEED TO GET UP AND DO A CALMING ACTIVITY SUCH AS READ A BOOK OR LISTEN TO SOFT MUSIC FOR 15 MONUTES THEN TRY TO GO TO SLEEP  AGAIN. YOU ALSO NEED TO REPEAT THIS STEP AS MANY TIMES AS YOU HAVE TO GO TO SLEEP.  AND YOU NEED TO REMEMBER THAT YOU NEED TO START SMALL AND KNOW IT WILL NOT WORK THE FIRST TIME OR EVERY TIME.         Brooke Maxwell  s a 71 y.o. female participating in a psychoeducational group.    Comments:  Patient did not attend group.    Truddie Hidden, MEd 06/15/2021 11:18

## 2021-06-15 NOTE — Behavioral Health (Signed)
Counselor met with the patient today and wished her happy birthday. Patient was crying because she thought her daughter would have called her today  to wish her happy birthday. Counselor asked patient if she has attempted to speak with her daughter or provided her 3 digit passcode to her daughter. Patient stated no. Counselor explained the phone call policy at [facility] and privacy policy and encouraged patient to call her daughter at the next call time to share her code. Patient worried she cannot remember her daughter's phone number or her passcode. CN aware and staff will assist patient with calling daughter and ensuring the patient provides correct passcode to the daughter.

## 2021-06-15 NOTE — Group Note (Signed)
Name: Brooke Maxwell   Date of Birth: [birth year]   Today's Date: 06/15/2021   Group Start Time: 1300   Group End Time: 1400   Group Topic: ACTIVITY GROUP  Number of participants: 3    Summary of group discussion:         patients participated in coping skills through jewelry making       Brooke Maxwell's Participation:Patient actively participated in group

## 2021-06-15 NOTE — Nurses Notes (Signed)
7a-7p: Shift note. Patient up and about in the unit, steady gait. No acute distress note. Patient is calm, cooperative. Patient denies SI/HI, denies AV hallucinations. Patient is withdrawn, flat affect. Ordered meds given and well tolerated. Tylenol PO PRN for back pain given and well tolerated. Meals and snacks provided and encouraged. Patient continuos to be monitored visually, Q15 minutes by staff.

## 2021-06-15 NOTE — Care Plan (Signed)
PATIENT PLEASANT AND COOPERATIVE. PATIENT WITH DRAWN TO ROOM THIS AM. PATIENT IN GROUP THIS AFTERNOON.   Problem: Adult Behavioral Health Plan of Care  Goal: Patient-Specific Goal (Individualization)  Outcome: Ongoing (see interventions/notes)     Problem: Adult Behavioral Health Plan of Care  Goal: Absence of New-Onset Illness or Injury  Outcome: Ongoing (see interventions/notes)     Problem: Adult Behavioral Health Plan of Care  Goal: Optimized Coping Skills in Response to Life Stressors  Outcome: Ongoing (see interventions/notes)

## 2021-06-15 NOTE — Progress Notes (Signed)
[name]  Date of Admission:  06/14/2021  Date of Birth:  07/02/1950  Date of Service:  06/15/2021    Mr. Brooke Maxwell is seen today in follow up of continuing psychiatric symptoms.  Notes and labs reviewed.  Case discussed with treatment team and hospitalist.  Today the patient states that isn't feeling very well this morning.  The patient complains of pain in her legs and her hips.  She complains of headache.  She states her depression is still markedly high and still does not feel safe to be around herself alone.  Counselors informed me that the relationship between she and her mother is very strained and there is a lot of financial issues involved.  They are going to try and have a family session.  In the meantime I will continue the patient's regular medications, we have asked hospitalist to get involved to see if they can manage the pain any better.  Will make changes in additions as needed.    Takes meds.  No reported side effects.   No reports of problem behaviors or violence.  No prn meds required.  No physical complaints on review of systems.      Alert and oriented x4.  Casual dress, calm, disheveled looking.  No SI/HI/AVH, delusions, or paranoia.  Thoughts are logical, coherent, goal directed.  Good eye contact. Speech is normal rate and tone.  Mood is ` depressed  affect congruent.  No psychomotor agitation or psychomotor retardation, no cogwheel rigidity or abnormal movements.  Gait is normal.  Attention is good.  Concentration and memory good.  No cognitive deficits noted.  Judgment fair, insight fair.  Calculation and abstraction are within normal limits.    Diagnosis:  Axis 1:  Bipolar disorder unspecified, depressive episode with suicidal ideation                  Generalized anxiety disorder  Axis 2:  Deferred  Axis 3: See past medical history  Plan:    Attending physician has discussed meds, side effects, and treatment options, need for sobriety and compliance,  and prognosis with patient/ HCS.  I encouraged groups and working on coping skills.  To work with counselors as indicated.      Changes:  Continue current medications, had hospitalist evaluate pain,  Continue support, safety, groups, observation and allow time for further improvement.    I certify that this psychiatric inpatient admission is medically necessary for treatment which can reasonably be expected to improve the patients condition.      Lou Cal, PA-C

## 2021-06-15 NOTE — Nurses Notes (Signed)
7p-7a Nurse note  Patient calm and cooperative this shift. Pleasant mood noted. Patient withdrawn to room. Rates anxiety a 0 and depression a 7 on a numeric scale of 1 to 10 with 10 being the worst. Denies SI, HI, and hallucinations at this time. C/o leg pain and muscle spasms. See mar and pain assessment. No distress noted. Reports having a good appetite and sleeping well throughout the night. Snacks offered and encouraged. Staff will continue to visually monitor patient Q 15 minutes and PRN.

## 2021-06-15 NOTE — Group Note (Signed)
Psychoeducational Group Note  Date of group:  06/15/2021  Start time of group:  1000  End time of group:  1100               Summary of group discussion:HOW TO GET A BETTER NIGHT SLEEP  THE COUNSELRO WENT OVER A SHEET ON WAYS TO GET A BETTER NIGHT SLEEP. THE GROUP TALKED AOBUT HOW TO TAKE CARE OF YOUR BODY: SUCH AS EATING HEALTHY, NOT DRINKING CAFFINE AFTER 4:00PM, NOT EATING A BIG OR SPICY MEAL LATE IN THE EVENING, NOT GOING TO BED HUNGRY EITHER, AND TO AVOID ALCOHOL.     THEY THEN TALKED AOBUT PHYSICAL EXERCISE AND HOW IT HELPS YOUR BODY GET TIRED SO TO TRY AND DO SOME TYPE OF EXERCISES DAILY IN THE LATE AFTERNOON.   THEY ALSO TALKED AOBUT HOW YOU HAVE TO SLEEP ONLY AT NIGHT AND TO AVOID DAY TIME NAPS.     THEY THEN TALKED ABOUT HAVING A REGULAR BEDTIME ROUTINE: DRINKNING A SOOTHING DRINK AT NIGHT BEFORE BED, TAKING A WARM BATH OR SHOWER ECT.., GO TO BED AT THE SAME TIME EACH NIGHT, THINKING OF NICE THINGS IN BED, RELAXATION BREATHING, AND WAKING UP AT THE SAME TIME EACH DAY.   THE GROUP THEN TALKEDA BOUT BAD DREAM AND HOW TO DEAL WITH THEM SUCH AS: PREPARING YOURSELF INCASE YOU HAVE ONE TO RE-ORIENT YOURSELF OR WHAT TO DO IF YOU HAVE ONE, PUT A DAMP TOWEL ON YOUR NIGHT STAND TO WIPE YOUR FACE, HAVING SOMETHING THAT GIVES YOU COMFORT LIKE A STUFF TOY OR A PICTURE WHERE YOU CAN SEE IT OR TOUCH IT, USING SCENTS OR TEXTURES TO SHOW YOURSELF YOU ARE AWAKE AND NOT STILL DREAMING. AND MOVING YOUR BODY TO FULLY WAKE YOURSELF UP.     THEY THEN TALKED ABOUT MAKINGYOUR BEDROOM A RELAXING PLACE THAT IS SET UP FOR SLEEPING. SUCH AS GET A NIGHT LIGHT, KEEPING IT CLEAN AND TIDY, HAINVG SOOTHING SMEELS, USING AN EXTRA PILLOW, AND MAKING SURE YOU FEEL SAFE BY LOCKING DOORS AND WINDOWS ECT.    THEY THEN TALKED ABOUT HOW YOU SHOULE LAY IN BED AND TRY TO SLEEP FOR 30 MINUTES IF YOU DON'T GO TO SLEEP BY THEN YOU NEED TO GET UP AND DO A CALMING ACTIVITY SUCH AS READ A BOOK OR LISTEN TO SOFT MUSIC FOR 15 MONUTES THEN TRY TO GO TO SLEEP  AGAIN. YOU ALSO NEED TO REPEAT THIS STEP AS MANY TIMES AS YOU HAVE TO GO TO SLEEP.  AND YOU NEED TO REMEMBER THAT YOU NEED TO START SMALL AND KNOW IT WILL NOT WORK THE FIRST TIME OR EVERY TIME.           Brooke Maxwell  s a 71 y.o. female participating in a psychoeducational group.    Affect/Mood:  Appropriate    Thought Process:  Logical    Thought Content:  Within normal limits    Interpersonal:  Discussed issues    Level of participation:  Full    Comments:     Truddie Hidden, MEd 06/15/2021 11:20

## 2021-06-15 NOTE — Group Note (Signed)
Name: KILA LANGNESS   Date of Birth: [birth year]   Today's Date: 06/15/2021   Group Start Time: 1530   Group End Time: 1630   Group Topic: ACTIVITY GROUP  Number of participants: 6    Summary of group discussion:       Patients explored relaxation techniques through active music making and listening music.         Mychal's Participation: Patient did not attend group

## 2021-06-15 NOTE — Group Note (Signed)
Psychoeducational Group Note  Date of group:  06/15/2021  Start time of group:  1430  End time of group:  1530               Summary of group discussion: Group discussion about changing your mindset from negative to positive.    Discussed the things that consume our thoughts that are negative: being tired, stressed, relationship problems, housing problems, financial problems, being mad, feeling like you have no control or purpose, not physically feeling well, feeling like giving up, feeling unimportant, loneliness, pain, etc.    Discussed why these things often consume our thoughts and how they make [place] feel. Discussed how these things negatively impact [place] physically, emotionally and psychologically. Discussed how we can utilize coping skills to process these.  Discussed the importance of positive thinking and the impact it has. Discussed how thinking positively can reprogram how we thing in general so we do not always focus on negativity.     Changing our mindset to where we think more positively than negatively and how this allows [place] to better process our struggles moving forward.    Discussed the value of remembering one's positive qualities, attributes, accomplishments. Discussed the importance of having positive memories to utilize when we are feeling down. Discussed the importance of perspective.       Brooke Maxwell  s a 71 y.o. female participating in a psychoeducational group.    Affect/Mood:  Sad/flat    Thought Process:  appropriate    Thought Content:  Within normal limits    Interpersonal: engaged with others    Level of participation:  Good participation    Comments: The patient had good participation but did cry at times. The patient also discussed things that can bring her hope.    Lowell Bouton, MS 06/15/2021 15:53

## 2021-06-16 NOTE — Care Plan (Signed)
PATIENT IS PLEASANT AND COOPERATIVE. TEARFUL THIS AM DUE TO NOT BEING ABLE TO GET ANYONE ON THE PHONE. PATIENT ENCOURAGED TO LEAVE MESSAGE. TALKED TO PATIENT ABOUT WHERE THEY MAY BE-IE WORK, DRIVING, ETC.   Problem: Adult Behavioral Health Plan of Care  Goal: Absence of New-Onset Illness or Injury  Outcome: Ongoing (see interventions/notes)     Problem: Adult Behavioral Health Plan of Care  Goal: Optimized Coping Skills in Response to Life Stressors  Outcome: Ongoing (see interventions/notes)     Problem: Adult Behavioral Health Plan of Care  Goal: Develops/Participates in Therapeutic Alliance to Support Successful Transition  Outcome: Ongoing (see interventions/notes)

## 2021-06-16 NOTE — Behavioral Health (Deleted)
Patient up in a wheel chair in the day room today. Patient pleasantly confused but has no complaints.

## 2021-06-16 NOTE — Behavioral Health (Signed)
Counselor met with patient today. Patient crying and very fretful her daughter wants nothing to do with her because she cannot get in touch with her on the phone. Patient paranoid her daughter has changed her phone number. Patient signed a ROI for her daughter, Brooke Maxwell. Counselor provided reassurance to the patient and support.   Counselor was able to determine the daughter's phone number was in the new computer system wrong--so the patient has been calling the wrong number. Counselor had the phone number corrected.  Counselor called the patient's daughter, Brooke Maxwell, at [phone]  Daughter answered the phone right away, was very nice and willing to speak with the counselor and the patient.  Counselor advised why the patient had not been in touch with the daughter, but ensured the patient would call the daughter now. Counselor will facilitate a family conference tomorrow between the patient and her daughter. Nursing let the patient call her daughter with the correct phone number since the patient has been so upset and distraught. Patient relieved to know her daughter had not changed her number or been trying to avoid the patient.

## 2021-06-16 NOTE — Group Note (Signed)
Psychoeducational Group Note  Date of group:  06/16/2021  Start time of group:  1000  End time of group:  1100               Summary of group discussion: The Counselor went over the hand out   10 Proven Ways to Make Your Own [place] Aver  The less you leave to chance the luckier you're likely to be.    learn more about [place] . Mar 16, 2015  Sometimes it seems like [employer] picks only a few people upon which to smile. Fate always seems to work in their favor, and they always land on their feet.    How do they do it? Are they really just that lucky? And is there a way of persuading the universe to favor you with the same opportunities?    There are proven ways through which you can create your own luck. These lucky people, whether they realize it or not, have probably found ways to crack the code. They have learned to put themselves in the right situations, take advantage of the right opportunities and accomplish their goals through a lot of hard work.    If you're looking to harness fate, here are 10 proven ways to make your own luck.    1. Be social and improve your odds.  Talk to new people at a party. Broaden your social circle, and build your professional network. Introduce old friends to new acquaintances. One of the fastest and best ways to garner more luck is to put yourself out there and meet more people, because people bring connections and connections bring opportunity.    However, don't forget about your old friends either. Keep in touch with the people with whom you've built strong bonds -- the ones you know you can count on. Make sure you tend to those relationships, because they are powerful over the long haul.    Related Book: Networking Like a Pro by [employer]    2. Imagine the stepping stones to success.  Visualizing good fortune helps you get there. For instance, the best athletes envision winning a game before it starts. If you are preparing for a job interview or a big presentation,  imagine your calm demeanor and your thoughtful responses while you get ready.    If you have a goal in mind, visualize your path to success and figure out where you want to be in a month or a year from now. What does your success look like? Now work backward to where you are now. What are the steps you have to take along the way? Seeing each detail in high resolution is key.    3. Help others and find out how lucky you are.  What goes around comes around. Lucky people don't hog their prosperity, but give freely of their time and resources. They help others because it's the right thing to do.    However, as a bonus, giving reminds [place] of how lucky we already are. And those good deeds will very often come back to [place] in unexpected ways.    Related: YouTube's Highest-Paid Star Just Trolled Everyone. Here's What You Can Learn.    4. Leave room for randomness.  Don't let yourself get stuck in a rut. If you shy away from experiencing new things, you are bound to feel bored and stifled. Be open to new things. Be curious about the world around you.  This will give you the opportunity to explore and gain new insights; to see things from a different perspective or meet someone you wouldn't normally meet. When we put ourselves out there, we welcome chance into our lives.    5. Be on the lookout for luck.  Luck isn't always something that you stumble upon, or that randomly happens to you. Sometimes luck is something you can spot, if you allow yourself to look for it. But more often than not, luck is about learning where to find an opportunity.    You can thus create your own luck by being aware of your circumstances and surroundings. By simply noticing and acting on opportunities or listening to your hunches, you allow more luck into your life.    Related: 5 Steps You Can Take Now to Get Wealthy in 2017    6. Take a chance.  If you don't push yourself out of your comfort zone, you'll never know the true extent of your  abilities. Take advantage of favorable circumstances and see where they take you. Allow yourself to be versatile and wear different hats.    And, most important, act when an opportunity presents itself. Too often businesses fail or we lose our shot simply because of inaction. As hockey great [employer] once said, "You will miss 100 percent of the shots you don't take."    7. Create your own luck through hard work.  Psychologist Corinne Ports has studied luck and its presence in people's lives. He found that if you think you'll be lucky, chances are you'll experience more good fortune than someone who thinks they are unlucky.    About 51 percent of lucky people are prepared to work hard and create their good luck. They also have a positive attitude and are resilient when bad things happen.    Unlucky people tend to be fatalistic and believe that no matter how hard they work, they will be unlucky. As Timmothy Euler notes, it becomes a self-fulfilling prophecy.    Related: 101 Businesses That Will Make You an Extra $500 Next Month    8. See the positive side.  Instead of seeing the grass as being greener on the other side, take a closer look at all the positive things happening on your side of the fence. After all, if you only see downers in your life, then you will exude only negative energy.    Besides, all that hypothetical green grass you so admire may in reality be a huge task to keep up. The bottom line is: focus on the positives. Look for the silver linings even in what seems like a bad situation.    9. Accept that setbacks happen.  No one is lucky all of the time. Failure happens. It's part of life. Accept that there will be moments when you'll feel down in the dumps. Realize that there will be times when you'll want to throw in the towel.    Don't let negativity set in, and don't give up. Brush the dust off, and look for a new angle to approach your problem.    Related Book: Fueled By Failure: Using Detours and  Defeats to Power Progress by [employer]    10. Stay in the present.  There are some things you can change and some you can't. Instead of getting fixated on things beyond your control, stay in the present.    Staying in the present allows you to see all the little things that  you have become oblivious to. And when it truly comes down to it, luck happens in the moment. If you are too focused on your own mind and thoughts, you will never see the opportunities right in front of you.      Brooke Maxwell  s a 71 y.o. female participating in a psychoeducational group.    Affect/Mood:  Appropriate    Thought Process:  Disorganized    Thought Content:  Religiously preoccupied and goes off topic during group    Interpersonal:  Discussed issues    Level of participation:  Full    Comments:     Abbey Chatters, MEd 06/16/2021 11:45

## 2021-06-16 NOTE — Progress Notes (Signed)
[name]  Date of Admission:  06/14/2021  Date of Birth:  02-Dec-1950  Date of Service:  06/16/2021    Mr. Titlow is seen today in follow up of continuing psychiatric symptoms.  Notes and labs reviewed.  Case discussed with treatment team and hospitalist.    Patient states he is doing a little better today.  States she slept well.  States her headache has eased.  She denies any suicidal or homicidal thoughts this morning.  No hallucinations.  The patient's birthday was yesterday, she was having her daughter would call but she did not.  Counselors have done some intervention there with the family making sure that her patient code is known.  I will continue current treatment for now and see how she does.  Takes meds.  No reported side effects.   No reports of problem behaviors or violence.  No prn meds required.  No physical complaints on review of systems.      Alert and oriented x4.  Casual dress, calm, disheveled looking.  No SI/HI/AVH, delusions, or paranoia.  Thoughts are logical, coherent, goal directed.  Good eye contact. Speech is normal rate and tone.  Mood is ` depressed  affect congruent.  No psychomotor agitation or psychomotor retardation, no cogwheel rigidity or abnormal movements.  Gait is normal.  Attention is good.  Concentration and memory good.  No cognitive deficits noted.  Judgment fair, insight fair.  Calculation and abstraction are within normal limits.    Diagnosis:  Axis 1:  Bipolar disorder unspecified, depressive episode with suicidal ideation                  Generalized anxiety disorder  Axis 2:  Deferred  Axis 3: See past medical history  Plan:    Attending physician has discussed meds, side effects, and treatment options, need for sobriety and compliance, and prognosis with patient/ HCS.  I encouraged groups and working on coping skills.  To work with counselors as indicated.      Changes:  Continue current medications, had hospitalist evaluate  pain,  Continue support, safety, groups, observation and allow time for further improvement.    I certify that this psychiatric inpatient admission is medically necessary for treatment which can reasonably be expected to improve the patients condition.      Lou Cal, PA-C

## 2021-06-16 NOTE — Nurses Notes (Signed)
Patient  Up and about in the unit. Patient denies any SI/HI and A/V hallucinations at this time. Patient is being monitored q 15 minutes by staff.

## 2021-06-16 NOTE — Nurses Notes (Signed)
7p-7a Nurse note   Patient cooperative this shift. Patient in a pleasant mood. Rates anxiety and depression a 6 on a numeric scale of 1 to 10 with 10 being the worst. Denies SI, Hi, and hallucinations at this time. C/o leg pain, see Mar. No distress noted. Snacks offered and encouraged. Patient withdrawn to room.  Staff will continue to visually monitor patient Q 15 minutes and PRN.

## 2021-06-16 NOTE — Group Note (Signed)
Name: Brooke Maxwell   Date of Birth: 08-May-1950   Today's Date: 06/16/2021   Group Start Time: 1300   Group End Time: 1350   Group Topic: ACTIVITY GROUP  Number of participants: 10    Summary of group discussion:       Patients worked on [name] day crafts and listened to music       [name]'s Participation: Patient did not participate in group

## 2021-06-16 NOTE — Group Note (Signed)
Name: Brooke Maxwell   Date of Birth: 01/19/1951   Today's Date: 06/16/2021   Group Start Time: 1530   Group End Time: 1615   Group Topic: ACTIVITY GROUP  Number of participants: 10      Summary of group discussion:       Patients worked on [name] day crafts and listened to music       [name]'s Participation: Patient did not attend group

## 2021-06-17 DIAGNOSIS — F411 Generalized anxiety disorder: Secondary | ICD-10-CM

## 2021-06-17 DIAGNOSIS — R45851 Suicidal ideations: Secondary | ICD-10-CM

## 2021-06-17 NOTE — Care Plan (Signed)
Patient up ad lib, ambulates on unit with steady gait, has been up to dayroom for groups, is compliant with medications and treatment  Problem: Adult Behavioral Health Plan of Care  Goal: Absence of New-Onset Illness or Injury  Outcome: Ongoing (see interventions/notes)     Problem: Manic or Hypomanic Signs/Symptoms  Goal: Improved Impulse Control (Manic/Hypomanic Signs/Symptoms)  Outcome: Ongoing (see interventions/notes)  Goal: Optimized Cognitive Function (Manic/Hypomanic Signs/Symptoms)  Outcome: Ongoing (see interventions/notes)  Goal: Improved Mood Symptoms (Manic/Hypomanic Signs/Symptoms)  Outcome: Ongoing (see interventions/notes)  Goal: Optimized Nutrition Intake (Manic or Hypomanic Signs/Symptoms)  Outcome: Ongoing (see interventions/notes)  Goal: Improved Psychomotor Symptoms (Manic/Hypomanic Signs/Symptoms)  Outcome: Ongoing (see interventions/notes)  Goal: Improved Sleep (Manic/Hypomanic Signs/Symptoms)  Outcome: Ongoing (see interventions/notes)  Goal: Enhanced Social, Occupational or Functional Skills (Manic/Hypomanic Signs/Symptoms)  Outcome: Ongoing (see interventions/notes)     Problem: Fall Injury Risk  Goal: Absence of Fall and Fall-Related Injury  Outcome: Ongoing (see interventions/notes)

## 2021-06-17 NOTE — Nurses Notes (Signed)
7P-7P SHIFT NOTE: PT UP AD LIB ON UNIT WITH STEADY GAIT. RATES ANXIETY 5/10 AND DEPRESSION 5/10. DENIES SI/HI/HALLUCINATIONS. REPORTS EATING AND SLEEPING WELL. MEDICATION COMPLIANT. NOA CUTE DISTRESS NOTED AT THIS TIME. WILL CONTINUE TO VISUALLY MONITOR Q15 MIN AND PRN THROUGHOUT SHIFT.

## 2021-06-17 NOTE — Behavioral Health (Signed)
Counselor met with the patient before the family conference today. Patient denies any financial exploitation from her daughter. Patient reports she willingly lets her daughter manage her disability income.    Called daughter Magda Paganini at phone [phone] to have the family conference.  Daughter is very nice and supportive. Call went well. Patient can return to her daughter's home locally where she has been living. Daughter apologized for some of the hurtful things she said to the patient. Daughter is supportive of the patient.  The daughter can pick the patient up whenever the provider decides to discharge the patient. Counselor informed patient and daughter no discharge date is set yet but the provider will decide on Monday.  Patient and daughter asked if the counselor thinks the provider here at [facility] would see the patient on an outpatient basis because PCP Dr. Levada Dy at [place] has reported limited ability to prescribe psychiatric medications. Counselor advised she can ask but cannot make any promises. Counselor advised once the provider decided on a discharge date--plans can be made/arranged for the patient's follow-up and transportation. If provider at [facility] cannot see the patient on an outpatient basis--then follow-up will be set with PCP, and the patient's discharge summary will be sent to PCP so they will be aware of what the patient is stabilized on medication wise.   Patient and her daughter are agreeable to this plan.  Patient continues to make improvements as far as her mood and affect. Patient has no complaints today, is more social, engaging with others well and is laughing and smiling.

## 2021-06-17 NOTE — Behavioral Health (Signed)
Counselor met with the patient today and explained the visitation policy. Patient asked the counselor to call her daughter to inform her that the patient's grandchildren cannot visit due to them being under the age of 54 and only 5 visitor allowed. Counselor advised patient the family conference call will occur today around 2:30pm. Patient's mood/affect has much improved since she has been able to speak with her daughter. Patient discussed she now realizes she is able to go back home with her daughter, her daughter has not kicked her out, has not forgotten about her and was not avoiding contact with the patient.  Counselor called the patient's daughter and explained the visitation policy. Family conference scheduled today for 2:30pm.  Patient and daughter agreeable to this plan.

## 2021-06-17 NOTE — Nurses Notes (Signed)
7PM 7AM Note: Alert, in room. No complaints voiced. Rates anxiety at 6 out of 10. Rates depression at 4 out of 10. Denies SI and HI. Denies auditory and visual hallucinations. Staff will cont. To monitor q15 mins and PRN.

## 2021-06-17 NOTE — Progress Notes (Signed)
[facility]  Date of Admission:  06/14/2021  Date of Birth:  1950-11-26  Date of Service:  06/17/2021    [name] is seen today in follow up of continuing psychiatric symptoms.  Notes and labs reviewed.  Case discussed with treatment team and hospitalist.    Patient states she is doing  better today.  States she slept well.  She denies any suicidal or homicidal thoughts.No hallucinations.   Takes meds.  No reported side effects.   No reports of problem behaviors or violence.  No prn meds required.  No physical complaints on review of systems.      Alert and oriented x4.  Casual dress, calm, disheveled looking.  No SI/HI/AVH, delusions, or paranoia.  Thoughts are logical, coherent, goal directed.  Good eye contact. Speech is normal rate and tone.  Mood is ` depressed  affect congruent.  No psychomotor agitation or psychomotor retardation, no cogwheel rigidity or abnormal movements.  Gait is normal.  Attention is good.  Concentration and memory good.  No cognitive deficits noted.  Judgment fair, insight fair.  Calculation and abstraction are within normal limits.    Diagnosis:  Axis 1:  Bipolar disorder unspecified, depressive episode with suicidal ideation                  Generalized anxiety disorder  Axis 2:  Deferred  Axis 3: See past medical history  Plan:    Attending physician has discussed meds, side effects, and treatment options, need for sobriety and compliance, and prognosis with patient/ HCS.  I encouraged groups and working on coping skills.  To work with counselors as indicated.      Changes:  Continue current medications, had hospitalist evaluate pain,  Continue support, safety, groups, observation and allow time for further improvement.    I certify that this psychiatric inpatient admission is medically necessary for treatment which can reasonably be expected to improve the patients condition.

## 2021-06-17 NOTE — Group Note (Signed)
Group topic:  EDUCATION GROUP    Date of group:  06/17/2021  Start time of group:  1000  End time of group:  1100               Summary of group discussion:  Effectively Communicating Your Feelings    It's not something we are taught in school, and because it is not something people often do, it can feel uncomfortable and awkward. We have learned to suppress strong feelings sometimes and there may be unspoken social rules about expressing them. At times we might express feelings in ways that hurt others, ourselves, or cause our very own feelings to not be heard by others using?aggressive, passive aggressive, or passive communication styles.     I'm not saying that the act of communicating your feelings, especially sensitive feelings, will be easy, but going through the short-term pain can improve the long-term quality of your life. It's worth it, and all of your feelings are worth it.       All of your feelings are OKAY and IMPORTANT!     It can sometimes seem as if our feelings are bad, wrong, or that we shouldn't feel them. The fact is that our feelings are automatic, and they aren't something we choose. Since you have zero control over them, don't apologize or dismiss them. Use them to bring more happiness and peace to yourself and your relationships. But before we can use them, we need to know what our feelings are.     Figure out what you are feeling     Knowing what we are feeling allows us to correctly address and work on the problem, so the next step to communicating your feelings productively is to.     1. Label your feelings     We have all experienced times in our lives when we have interacted with someone and it made you feel off, but you are just not quite sure what you really felt, why you felt that way. It can really help to?increase your emotional vocabulary. Pick one or 2 words that describe what you were feeling. This can be confusing because sometimes we feel two conflicting emotions at the same  time.     Bittersweet is a term that is an excellent example of this. You might feel happy that your colleague got a new job but very sad that you do not get to work with her anymore. Or you might feel very excited about all the possibilities and new things to learn and experience when starting a new business or job, but at the same time feel scared, nervous.      2. Dig for deeper and more vulnerable emotions     There are surface emotions and there are deeper emotions. Or we can call them primary and secondary emotions. Secondary (surface) emotions might be guilt, frustration, overwhelm, worry, anxiety or anger. We might feel frustrated, but the primary feeling underneath that is inadequacy. We might feel anger on the surface, but what we are really feeling is hurt. Or we might feel guilt when in fact we are feeling grief. Labeling deeper emotions, rather than just defining all emotions as just angry, is so helpful because when you use deeper emotions it is easier for the other person to really be able to understand and make sense of the problem.     I see many couples who see their partner as just angry. The angry person isn't explaining the vulnerable   feelings beneath the anger. When someone comes at you with anger it is hard to hear and not get defensive, but if that same person tells you that they feel that they are not loved or important when their partner doesn't help around the house, it is a lot easier to hear the other person's feelings.     With anger, it's helpful to think of what was the first feeling you felt before you felt angry, and to express and explain that. ?For example, it is not helpful to yell at your child who ran out in the street, but it is helpful to tell your child how scared you were that they might get hit by a car. That's the vulnerable feeling, and vulnerable feelings are an essential part of productively communicating your feelings.     3. Figure out why you feel that way     Ask  yourself some clarifying questions such as: "what led you to feel this way?", what happened right before you felt that way?". The goal here is to find the root cause. ?Quite often we want to go into blaming, but that never solves the problem and is never effective in productively communicating your feelings. Were you hurt by something that your partner said? Were you humiliated that your boss reprimanded you and brought up a mistake you made in front of all of your colleagues? ?Are you scared about starting a new job?      Journaling can be really helpful in figuring out the why. It can also help to ask yourself some questions such as when did you first began to feel this way? Have you always felt like this? Have you felt this way before? When? What's similar about those circumstances?     Write down what you want to say and read it--this can help keep you organized and on topic.      4. Pick the best time and place to talk     Do not have important conversations when you know the person you are talking to is not going to be able to listen. Choose a time when you are both feel relaxed, calm, well-rested, fed and not in the middle of something else. Choose a quiet place with limited distractions. Maybe do it after you just had a good laugh or joke or you are cuddling and feeling really close.     TIP: It really helps to?listen first. The more the other person feels heard the more likely they are to listen to what you have to say.     5. Use "I" statements     There is a huge difference between using 'I' statements and 'You' statements.? A 'You' statement can make the listener feel blamed and attacked and put them on the defense which prevents the listener from being heard. It escalates conflict by causing the listener to feel blamed, judged and criticized. The listen could become angry. An example is: "you didn't put the dishes away!" or "you were late for our date again".     On the other hand, an 'I' statement is  less aggressive and confrontational.? It focuses on the speaker taking full responsibility for their own feelings. 'I' statements are much easier for the listener to hear which helps to encourage positive communication. For example: "I feel stressed out that you didn't clean up the dishes after yourself because it means I am going to have to clean them up in the morning",? "I have big   meeting and can't be late", and "I feel?angry that you criticized my way?of parenting the children".     An example of digging for the deeper emotions (as described above) would be recognizing and expressing that underneath the anger you probably really feel inadequate and insecure as a parent when you feel your way of parenting is being criticized. It might make you especially angry because you love and care for the children so much. Because they are so important to you, you want to be the best parent to them and you feel ashamed and like you aren't doing a good enough job when someone else questions your parenting. ?If you could express these deeper feelings to your partner they would more likely really hear and understand what you are feeling and be more likely to change their criticism to a more productive communication technique.     Bring solutions     For example, instead of yelling at me when you feel angry, can you tell me all of the things that are making you feel angry using I feel statements. Or, let's start a chore chart so we know who is doing what and so I don't feel the need to nag.     6. Tell the other person how making these changes will benefit everyone     If we were to cook together more often, it would save [place] money from ordering out or going out for dinner, it could be a fun and help [place] maintain a healthy lifestyle. Using I feel angry statements will get it off your chest so you feel better, it will help [place] have a healthy relationships and communication skills, and to feel good about ourselves and each other.      TIP: Sometimes people don't really care about your feelings. They might get mad at you for having and sharing them. They might dismiss them by saying you too sensitive or that you are being dramatic. These people may not be the people you want to share your feelings with and you might what to reconsider how much you let them be a part of your life. ?This is where boundaries come in. People who care about you will treat your feelings as important and they will care about your well-being and happiness. The right people will love and support you by listening to your feelings. It might take them some time to get to the place where they understand; it might take them some time to process the information you share. So it may seem as though they are hurt, upset, confused at first. BUT if they genuinely care about the relationship--they will come around to opening up the lines of communication.      We have to remember we cannot make or force anyone else to care, want to listen, want to talk or understand. We can ONLY control our responses and how we handle ourselves in these situations.        Brooke Maxwell  is a 71 y.o. female participating in a experiential/activity group.    Patient's mental status/affect: alert and oriented    Patient's behavior: coooperative    Patient's response: attentive and good participation in group    Lowell Bouton, MS  06/17/2021, 11:31

## 2021-06-18 NOTE — Nurses Notes (Signed)
Patient is alert and oriented, resting in room. Calm and cooperative while assessing and giving medication, no c/o pain or discomfort, vital signs within normal limits, gait is steady. Patient stated appetite and sleep have gotten better since being here at facility. Drinks and snacks provided and encouraged, last BM 06/17/21. Patient denies anxiety, depression, SI/HI, and any A/V hallucinations. Patient will continue to be visually monitored q.15 minutes and PRN by staff.

## 2021-06-18 NOTE — Nurses Notes (Signed)
7A-7P NURSE ASSESSMENT NOTE: PT UP AD LIB ON UNIT WITH STEADY GAIT. ALERT AND ORIENTED. DENIES ANXIETY AND DEPRESSION. DENIES SI/HI/HALLUCINATIONS/DELUSIONS. REPORTS EATING FAIR AND SLEEPING GOOD. MEDICATION COMPLIANT. NO ACUTE DISTRESS NOTED. WILL CONTINUE TO VISUALLY MONITOR Q15 MINS AND PRN THROUGHOUT SHIFT BY STAFF.

## 2021-06-18 NOTE — Care Plan (Signed)
Patient up ad lib, ambulates on unit with steady gait, has been up to dayroom for groups and meals, she is pleasant, cooperative and compliant with meds and treatment  Problem: Adult Behavioral Health Plan of Care  Goal: Absence of New-Onset Illness or Injury  Outcome: Ongoing (see interventions/notes)     Problem: Manic or Hypomanic Signs/Symptoms  Goal: Improved Impulse Control (Manic/Hypomanic Signs/Symptoms)  Outcome: Ongoing (see interventions/notes)  Goal: Optimized Cognitive Function (Manic/Hypomanic Signs/Symptoms)  Outcome: Ongoing (see interventions/notes)  Goal: Improved Mood Symptoms (Manic/Hypomanic Signs/Symptoms)  Outcome: Ongoing (see interventions/notes)  Goal: Optimized Nutrition Intake (Manic or Hypomanic Signs/Symptoms)  Outcome: Ongoing (see interventions/notes)  Goal: Improved Psychomotor Symptoms (Manic/Hypomanic Signs/Symptoms)  Outcome: Ongoing (see interventions/notes)  Goal: Improved Sleep (Manic/Hypomanic Signs/Symptoms)  Outcome: Ongoing (see interventions/notes)  Goal: Enhanced Social, Occupational or Functional Skills (Manic/Hypomanic Signs/Symptoms)  Outcome: Ongoing (see interventions/notes)     Problem: Fall Injury Risk  Goal: Absence of Fall and Fall-Related Injury  Outcome: Ongoing (see interventions/notes)

## 2021-06-18 NOTE — Group Note (Signed)
Group topic:  OPEN GROUP DISCUSSION    Date of group:  06/18/2021  Start time of group:  1430  End time of group:  1530                 Summary of group discussion: Various topics surrounding coping skills      [name]  is a 71 y.o. female participating in a activity group.    Patient observations: PARTICIPATED      Patient goals: TO DISCHARGE AND CONTINUE UTILIZING COPING SKILLS      Ames Coupe Alexiah Koroma, LCSW  06/18/2021, 15:47

## 2021-06-18 NOTE — Group Note (Signed)
Group topic:  STRESS MANAGEMENT    Date of group:  06/17/2021  Start time of group:  1430  End time of group:  1530                 Summary of group discussion:    Patient gain insight into stress related triggers and knowledge of alternative healthy coping skills to overcome stressors.      Brooke Maxwell  is a 71 y.o. female participating in a activity group.    Patient observations:      Patient goals:      Vinie Sill, MA LPC  06/18/2021, 11:10

## 2021-06-19 MED ORDER — LORATADINE 10 MG TABLET
10.0000 mg | ORAL_TABLET | Freq: Every day | ORAL | Status: DC
Start: 2021-06-19 — End: 2021-06-20
  Administered 2021-06-19 – 2021-06-20 (×2): 10 mg via ORAL
  Filled 2021-06-19 (×2): qty 1

## 2021-06-19 NOTE — Nurses Notes (Signed)
7PM 7AM Note: Alert, in day room. No complaints voiced. Rates anxiety at 0 out of 10. Rates depression at 0 out of 10. Denies SI and HI. Denies auditory and visual hallucinations.Staff will cont to monitor q15 mins and PRN.

## 2021-06-19 NOTE — Progress Notes (Signed)
[name]  Date of Admission:  06/14/2021  Date of Birth:  [birth year]  Date of Service:  06/19/2021    Mr. Brooke Maxwell is seen today in follow up of continuing psychiatric symptoms.  Notes and labs reviewed.  Case discussed with treatment team and hospitalist.    Patient is up this morning sitting on the edge of her bed in her room.  She has no distress.  She tells me she feels much better and she is not having any suicidal homicidal thoughts as before.  Her anxiety and depression seem a lot more improved, she is requesting discharge.  She we are planning on discharging her in the a.m..    Takes meds.  No reported side effects.   No reports of problem behaviors or violence.  No prn meds required.  No physical complaints on review of systems.      Alert and oriented x4.  Casual dress, calm, disheveled looking.  No SI/HI/AVH, delusions, or paranoia.  Thoughts are logical, coherent, goal directed.  Good eye contact. Speech is normal rate and tone.  Mood is ` improved  affect congruent.  No psychomotor agitation or psychomotor retardation, no cogwheel rigidity or abnormal movements.  Gait is normal.  Attention is good.  Concentration and memory good.  No cognitive deficits noted.  Judgment fair, insight fair.  Calculation and abstraction are within normal limits.    Diagnosis:  Axis 1:  Bipolar disorder unspecified, depressive episode with suicidal ideation                  Generalized anxiety disorder  Axis 2:  Deferred  Axis 3: See past medical history  Plan:    Attending physician has discussed meds, side effects, and treatment options, need for sobriety and compliance, and prognosis with patient/ HCS.  I encouraged groups and working on coping skills.  To work with counselors as indicated.      Changes:  Continue current medications, had hospitalist evaluate pain,  Continue support, safety, groups, observation and allow time for further improvement.  Plan to discharge the patient  in the a.m..    I certify that this psychiatric inpatient admission is medically necessary for treatment which can reasonably be expected to improve the patients condition.

## 2021-06-19 NOTE — Care Plan (Signed)
PATIENT IS PLEASANT AND COOPERATIVE. OBSERVED IN THE DAY ROOM FOR MEALS AND GROUPS.   Problem: Adult Behavioral Health Plan of Care  Goal: Absence of New-Onset Illness or Injury  Outcome: Ongoing (see interventions/notes)     Problem: Fall Injury Risk  Goal: Absence of Fall and Fall-Related Injury  Outcome: Ongoing (see interventions/notes)     Problem: Manic or Hypomanic Signs/Symptoms  Goal: Improved Mood Symptoms (Manic/Hypomanic Signs/Symptoms)  Outcome: Ongoing (see interventions/notes)

## 2021-06-19 NOTE — Group Note (Signed)
Group topic:  STRESS MANAGEMENT    Date of group:  06/19/2021  Start time of group:  1000  End time of group:  1100                 Summary of group discussion: Art and music therapy. The counselor let the patient's color or for all during group.  She also with a round the group and let each patient pick a song to play.      MAHREEN HILTUNEN  is a 71 y.o. female participating in a activity group.    Patient observations:  Patient was quiet in group but did not participate.      Patient goals:  To continue coming to groups.    Abbey Chatters, MEd  06/19/2021, 11:30

## 2021-06-20 DIAGNOSIS — R45851 Suicidal ideations: Secondary | ICD-10-CM

## 2021-06-20 DIAGNOSIS — F411 Generalized anxiety disorder: Secondary | ICD-10-CM

## 2021-06-20 DIAGNOSIS — F332 Major depressive disorder, recurrent severe without psychotic features: Secondary | ICD-10-CM

## 2021-06-20 MED ORDER — LORATADINE 10 MG TABLET
10.0000 mg | ORAL_TABLET | Freq: Every day | ORAL | 1 refills | Status: DC
Start: 2021-06-20 — End: 2021-07-12

## 2021-06-20 MED ORDER — TRAZODONE 50 MG TABLET
50.0000 mg | ORAL_TABLET | Freq: Every evening | ORAL | 1 refills | Status: DC | PRN
Start: 2021-06-20 — End: 2022-04-09

## 2021-06-20 MED ORDER — LEVOTHYROXINE 50 MCG TABLET
50.0000 ug | ORAL_TABLET | Freq: Every morning | ORAL | 1 refills | Status: DC
Start: 2021-06-21 — End: 2022-04-09

## 2021-06-20 MED ORDER — ALPRAZOLAM 0.5 MG TABLET
0.5000 mg | ORAL_TABLET | Freq: Two times a day (BID) | ORAL | 1 refills | Status: DC
Start: 2021-06-20 — End: 2022-01-28

## 2021-06-20 MED ORDER — CITALOPRAM 10 MG TABLET
10.0000 mg | ORAL_TABLET | Freq: Every day | ORAL | 1 refills | Status: DC
Start: 2021-06-20 — End: 2021-07-12

## 2021-06-20 NOTE — Behavioral Health (Signed)
Counselor met with patient and went over her discharge plan. The patient's follow-up is set with her PCP and counselor advised she will get her appointment information with her discharge paperwork. The patient's daughter is coming to pick her up at 12pm today, and the patient will return to her daughter's residence.

## 2021-06-20 NOTE — Nurses Notes (Signed)
7p-7a Nurse note  Patient calm and cooperative. Patient c/o leg pain, see mar and pain assessment. Patient c/o muscle spasms. Baclofen 5mg  po PRN given per order. Patient denies experiencing SI, HI, anxiety, depression, and hallucinations at this time. Snacks offered and encouraged. Reports having difficulty sleeping. Trazodone PRN po given per order. Medications tolerated well. Reports having a good appetite. Steady gait. Staff will continue to visually monitor Q 15 minutes and PRN.

## 2021-06-20 NOTE — Discharge Instructions (Signed)
Take all medications as prescribed and keep all follow up appointments.

## 2021-06-20 NOTE — Behavioral Health (Signed)
Providing case coverage in lieu of primary therapist's schedule day off meeting with patient to discuss discharge planning as per attending he plans to release patient tomorrow.  Counselor reviewed with patient discharge plan discussing notes of primary therapist's where family conference was completed and patient can return home with daughter.  Patient confirmed in confirmed that daughter will be able to provide discharge transport.  Discussed was a 12 noon release time to allow nursing opportunity to complete discharge paperwork.  Patient verbalizes understanding.  Counselor explains that she and daughter reside locally that should nursing staff complete paperwork earlier she may call for earlier arrived but needs to tell daughter initially 12:00 p.m. and again patient verbalized understanding.  Counselor arrange for patient to contact daughter utilizing phone from the nurse's station  .  Counselor later confirmed patient had spoke with daughter to arrange transport 12 noon tomorrow.  CN Annette aware.

## 2021-06-20 NOTE — Group Note (Signed)
Psychoeducational Group Note  Date of group:  06/20/2021  Start time of group:  1000  End time of group:  1100               Summary of group discussion: The group went over the hand out : 15 Scientific Reasons Spring Is the Most Delightful Season    By [email or web address]  Jun 19, 2016    iStock / iStock  Summer, winter, and fall may have their fans, but spring is clearly the most lovable of the four seasons. Not convinced? Here are 15 scientific reasons why spring is great:  1. TEMPERATURES ARE MODERATE.    road and field on a sunny day / iStock  Spring marks the end of blistering winter and the transitional period to scorching summer. In many places, the season brings mild temperatures in the 60s and 70s. People tend to be most comfortable at temperatures of about 61F, research shows, so the arrival of spring means you can finally ditch the heavy winter layers and still be comfortable.  2. THERE IS MORE DAYLIGHT.    sunny sky / iStock  Following the spring equinox, days begin lasting longer and nights get shorter. Daylight Saving Time, which moves the clock forward starting in March, gives you even more light hours to get things done. Those extra hours of sun can be a major mood-booster, according to some research. A 2016 study of students in counseling at [facility] found that the longer the sun was up during the day, the less mental distress people experienced.  3. THE BIRDS RETURN.    blue bird on branch / iStock  Many animals migrate [place] during the winter, then head [place] as temperatures rise. For relatively [place] regions, there is no better indicator of spring than birds chirping outside your window. Their northward migration can start as early as mid-February and last into June, meaning that throughout the spring, you can expect to see a major avian influx. In addition to the satisfaction of marking species off your bird-watching checklist, seeing more of our feathered friends can make you  happy. In 2017, a [place] study found that the more birds people could see in their neighborhoods, the better their mental health.  4. THERE ARE BABY ANIMALS EVERYWHERE.    Baby squirrels / iStock  Many animals reproduce in the spring, when temperatures are warmer and food is plentiful. Baby bunnies, ducklings, chipmunks, and other adorable animals abound come spring. Studies have found that seeing cute animals can have positive effects on humans. For instance, one small study in 2012 found that when college students looked at [place] of baby animals, they were better at focusing on a task in the lab. Being able to watch fluffy baby squirrels frolic outside your office window might make spring your most productive season of the year.  5. YOU'RE SAFER.    flowers hanging outside of a house / iStock  In 6967, a pair of public policy researchers discovered a hidden upside to "springing forward" for Daylight Saving Time. It reduced crime. When the sun set later in the evening, the study published in the Review of Economics and Statistics found, robbery rates fell. After Daylight Saving Time started in the spring, there was a 27 percent drop in robberies during that extra hour of evening sunlight, and a 7 percent drop over the course of the whole day.  6. YOU CAN GO OUTSIDE.    child with rainbow umbrella jumping  in puddle / iStock  Warmer temperatures mean you can spend more time outside without freezing your feet off, which is great for mental health. Across the seasons, research has found that taking walks in nature slows your heart rate and makes you more relaxed, but some research indicates that there is something special about spring's effect on your brain. A 2005 study from the [place] linked spending 30 minutes or more outside in warm, sunny spring weather to higher mood and better memory. But the effect reverses when spring ends, since being outside in the warmest days of summer is usually pretty  uncomfortable.  7. IT MAKES YOU MORE CREATIVE.    woman writing in a park / [place]  That same [place] study found that spending time outside in the sunny spring weather isn't just a mood booster, it actually can change the way people think. The researchers found that being outdoors broadened participants' minds, leaving them more open to new information and creative thoughts.  8. THE LEAVES COME BACK.    leaves budding in spring / iStock  Spring brings green growth back to plants and trees. Depending on where you live, trees may begin sporting new leaves as early as mid-March. That successful spring leaf growth ensures a cool canopy to relax under during the hot summer--a hugely important factor in keeping cities comfortable. According to researchers, vegetation plays a big role in mitigating the urban heat island effect. When trees release water back into the air through evapotranspiration, it can cool down the areas around them by up to 31F, according to the EPA.  9. GROWING PLANTS ABSORB CARBON DIOXIDE.    tulip bulbs / iStock  It's amazing what a little sun can do for plants and grass. Through photosynthesis, plants convert sunlight, carbon dioxide, and water into food, releasing oxygen in the process. That means as plants start to grow in the spring, they pull carbon out of the atmosphere, providing an important environmental service. Plants take in roughly 25 percent of the carbon emissions humans produce, absorbing more than 100 gigatons of carbon through photosynthesis each growing season. Because of this, the amount of carbon dioxide in the atmosphere drops each spring and summer. (Unfortunately, it rises in the winter, when most plants aren't growing.)  10. IT'S EASY TO FIND FRESH PRODUCE.    wooden box full of fresh produce / iStock  Many vegetables and some fruits are harvested in the spring. 'Tis the season to get your local asparagus, greens, peas, rhubarb, and other fresh produce.  Getting more fruits and vegetables into your diet isn't just good for the body; it's good for the soul. A 2016 study of more than 12,000 [place] found that when people increased the amount of fruits and vegetables in their diet, they felt happier and had higher rates of life satisfaction. If they increased their intake by eight portions a day (a tall order, we know) the psychological gains were equivalent to the change in well-being people experience when they go from being unemployed to having a job, the researchers found.  11. FLOWERS ARE IN BLOOM.    Flowers in a vase / iStock  After months spent [employer], flowers bloom in the spring, once they sense that the days have grown longer and the weather has turned warmer. That's good for humans, because several studies have shown that looking at flowers can make you happy. A 2008 study of hospital patients found that having flowers in the  room made people feel more positive and reduced their pain and anxiety [PDF]. Another study from [employer] found that when participants were presented with a bouquet of flowers, it resulted in what scientists call a "true smile" a full 100 percent of the time. Seeing flowers had both "immediate and long-term effects" that resulted in elevated moods for days afterward, according to the researchers [PDF].  12. YOU CAN TAKE YOUR EXERCISE ROUTINE OUTDOORS.    woman tying shoes in flower field / iStock  While it's important to keep moving no matter what the weather, research shows that working out can be more beneficial if you do it outside. A 2011 study found that, compared with an indoor workout, exercising outdoors in nature increased energy levels, made people feel revitalized, and decreased tension, among other positive effects. People who worked out in the fresh air also tended to say they enjoyed the experience more and would be likely to repeat it, suggesting that using nature as your gym might help you stick  with your exercise regimen. While those benefits probably extend to winter, too, it's a whole lot easier to stomach the idea of a run once the weather warms up.  13. YOU DON'T HAVE TO WORRY ABOUT DRY AIR.    dew on grass and a daisy / iStock  Flu season in the [place] typically lasts through the fall and winter, usually peaking between December and February and tapering off during the spring. The seasonal change is in part because of dry air. Cold temperatures mean a drop in humidity, and indoor heating only makes the air drier. This lack of moisture in the air can dry out your skin and the nasal cavities, leading to nose bleeds, irritated sinuses, and a greater risk of getting sick. Since the mucus in your nose is designed to trap viruses, when it dries up, you're more likely to catch something nasty, like the flu. As the weather warms up and becomes more humid throughout the spring, that mucus comes back. As the season wears on, not only can you lay off the body lotion, but you can probably put away the tissues--if you don't have spring allergies, that is.  14. YOU CAN OPEN YOUR WINDOWS.    windows open on a red house / [place] weather makes it easier to get the fresh air you need. Opening your windows and allowing the breeze in serves as an important way to ventilate indoor spaces, according to the EPA. A lack of ventilation can lead to an unhealthy concentration of indoor pollutants from sources like cleaning product fumes, certain furniture and building materials, and stoves (especially gas ones), posing a threat to your health and comfort. Winter brings the highest rates of indoor pollutants like nitrogen oxide, a 2016 study of unventilated stove use in homes found. Spring brings the perfect opportunity to throw open those windows and doors and get the air moving again.  15. YOU CAN GET YOUR VITAMINS NATURALLY.    woman enjoying sitting in the sun / iStock  Sunlight triggers your body to produce vitamin  D, which keeps your bones strong. At [place], it's extremely difficult to get enough sun exposure naturally to maintain healthy vitamin D levels during the winter--even if you did want to expose your skin to the elements--but that starts to change during the spring. One [place] study found that in [place] (which shares a latitude with [place], [place], [place], [place], and several other major [place]), people  only need 10 minutes outside with a quarter of their bodies exposed to the spring sunshine to get an adequate daily dose of vitamin D.  A version of this story originally ran in 2014.      Brooke Maxwell  s a 71 y.o. female participating in a psychoeducational group.    Affect/Mood:  Appropriate    Thought Process:  Logical    Thought Content:  Within normal limits    Interpersonal:  Discussed issues    Level of participation:  Full    Comments:     Abbey Chatters, Kaylyn Layer 06/20/2021 11:53

## 2021-06-20 NOTE — Discharge Summary (Signed)
[facility]   DISCHARGE SUMMARY      PATIENT NAME:  Brooke Maxwell, Brooke Maxwell   MRN:  [id number]  DOB:  09-16-1950    ENCOUNTER DATE:  06/14/2021  INPATIENT ADMISSION DATE: 06/14/2021    DISCHARGE DATE: 06/20/2021    ADMITTING PHYSICIAN: Diona Browner  PRIMARY CARE PHYSICIAN: Phil Dopp, DO       DISCHARGE DIAGNOSIS:   Axis 1:  Bipolar disorder unspecified, depressive episode with suicidal ideation                  Generalized anxiety disorder  Axis 2:  Deferred  Axis 3: See past medical history  Axis 4: Severe psychosocial stressors  Axis 5: Admission GAF:  25   , best in past year:  Unknown       Hospital Problems    1Bipolar 1 disorder ([facility])         Date Noted: 06/14/2021      Suicidal ideations         Date Noted: 06/20/2021      Severe episode of recurrent major depressive disorder, without psychotic features (CMS [facility])         Date Noted: 06/20/2021      GAD (generalized anxiety disorder)         Date Noted: 06/20/2021      Resolved Hospital Problems  No resolved problems to display.      There are no active non-hospital problems to display for this patient.    Allergies   Allergen Reactions   . Cefaclor Anaphylaxis                                                                                             DISCHARGE MEDICATIONS:     Current Discharge Medication List      START taking these medications.      Details   ALPRAZolam 0.5 mg Tablet  Commonly known as: XANAX   0.5 mg, Oral, 2 TIMES DAILY  Qty: 60 Tablet  Refills: 1     citalopram 10 mg Tablet  Commonly known as: CELEXA   10 mg, Oral, DAILY  Qty: 30 Tablet  Refills: 1     levothyroxine 50 mcg Tablet  Commonly known as: SYNTHROID  Start taking on: June 21, 2021   50 mcg, Oral, EVERY MORNING  Qty: 30 Tablet  Refills: 1     loratadine 10 mg Tablet  Commonly known as: CLARITIN   10 mg, Oral, DAILY  Qty: 30 Tablet  Refills: 1     traZODone 50 mg Tablet  Commonly known as: DESYREL   50 mg, Oral, NIGHTLY PRN - MAY REPEAT X 1  Qty: 30 Tablet  Refills:  1        CONTINUE these medications - NO CHANGES were made during your visit.      Details   tizanidine 2 mg Capsule  Commonly known as: ZANAFLEX   2 mg, Oral, 3 TIMES DAILY PRN  Qty: 15 Capsule  Refills: 0            DISCHARGE INSTRUCTIONS:  No discharge procedures on file.      REASON FOR HOSPITALIZATION:  Depression, anxiety, thoughts of self-harm    HOSPITAL COURSE:    Brooke Maxwell is a 71 year old white female who is [facility] emergency department.  The patient presented there with increased depression, anxiety, and thoughts of self-harm.  The patient states she has been in a very difficult situation living with her daughter.  She states that is not a good situation.  She states that a kitten recently got into the laundry and was incidentally killed in the washer.  She states her daughter really got angry with her and threatened to kick her out of the house and it seems it to baker like this frequently.  The patient states she has no where to go and is scared.  The patient does have prior hospitalizations for psychiatric illness, she states that she overdosed 7 years ago and states that she has a history of bipolar disorder.  She states she has only been in [place] for approximately 3 years.  She has been living with her daughter even in [place].  The patient states she has not been able to establish psychiatric care since she has been back here.  She has not been on medications and has struggled.  She also admits to weakness, periodic syncope episodes, she has large areas of ecchymosis and swelling around her left cheek bone and forehead where she has fallen and hit her head.  The patient states she was scared and thought she might do harm to herself therefore she came to the hospital.  She states her symptoms been persistent over the past several years with suicidal thoughts just started recently.  Patient was admitted to the hospital for stabilization of  admission symptoms.  Medicines were instituted and adjusted.  Place the patient on Celexa, Xanax, and trazodone for sleep.  During the stay, the patient did well with medication adjustments and tolerated meds well.  The patient was enrolled in groups and milieu treatment.  We worked on Pharmacologist, stress management techniques, and the need for compliance, sobriety, and taking medications appropriately.  Medication side effects were discussed as well.  During the stay, the patient had no violence, agitation, or threatening behavior and required no p.r.n. medications, seclusion, or restraint.  The patient has improved significantly over the course of their stay, and is appropriate for step down to a less restrictive level of care.  The patient agrees with the plan and at the time of departure is not suicidal, homicidal, or psychotic.  I feel that the patient has maximized benefit from inpatient treatment.  Patient provided crisis numbers to call should symptoms worsen.  Condition at discharge:  Patient is stable and improved.  They are not suicidal, homicidal, or psychotic.  DC Diagnosis:  Axis 1:  Bipolar disorder unspecified, depressive episode with suicidal ideation                  Generalized anxiety disorder  Axis 2:  Deferred  Axis 3: See past medical history  Axis 4: Severe psychosocial stressors  Axis 5: Admission GAF:  25   , best in past year:  Unknown    Plan:  Patient will follow-up with outpatient provider as scheduled  Time taken on the day of discharge exceeded 30 minutes and included med reconciliation, treatment team staffing, discussion with patient, review of the record, and completion of all documentation.

## 2021-06-20 NOTE — Nurses Notes (Signed)
Patient discharged home after discharge instructions given. Denied SI/HI. No c/o oat time of discharge. Accompanied downstairs by staff

## 2021-07-12 ENCOUNTER — Other Ambulatory Visit (HOSPITAL_PSYCHIATRIC): Payer: Self-pay | Admitting: PHYSICIAN ASSISTANT

## 2021-07-25 IMAGING — MR MRI LUMBAR SPINE WITHOUT CONTRAST
5 of 6 series · 31 of 48 positions shown · IV contrast (gadolinium)
Comparison: None available.

﻿EXAM:  [id number]   MRI LUMBAR SPINE WITHOUT CONTRAST
INDICATION: Chronic lower back pain with left lower extremity radiculopathy.
TECHNIQUE: Multiplanar multisequential MRI of the lumbar spine was performed without gadolinium contrast.

[Series 9: T2 · sagittal · 4.0mm · 0.94mm/px · 5 of 14 slices shown (1 of 3)]
[im 1/14]
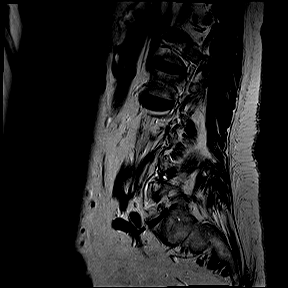
[im 4/14]
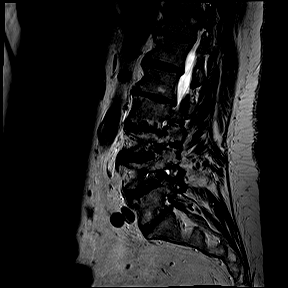
[im 7/14]
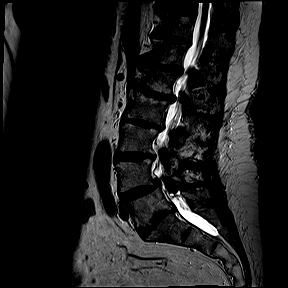
[im 10/14]
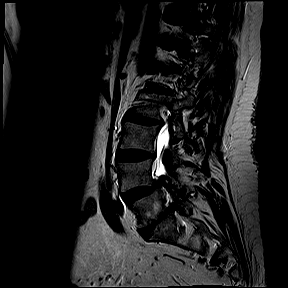
[im 14/14]
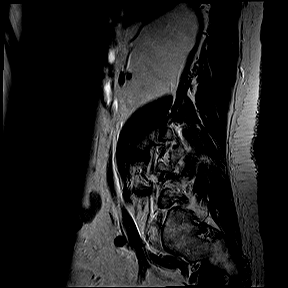

[Series 10: T1 · sagittal · 4.0mm · 0.94mm/px · 6 of 14 slices shown (1 of 2)]
[im 1/14]
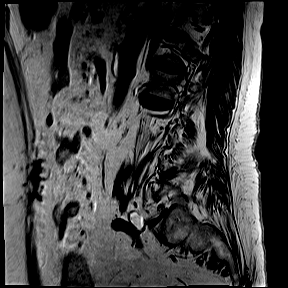
[im 3/14]
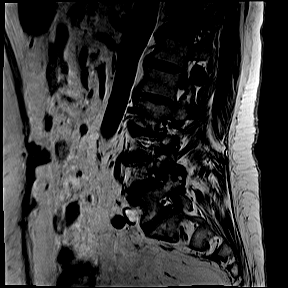
[im 6/14]
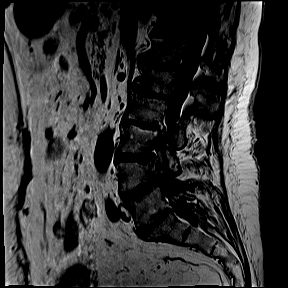
[im 8/14]
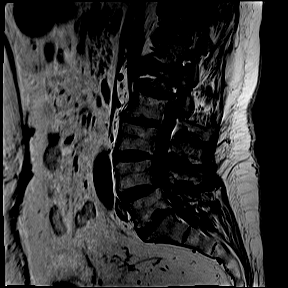
[im 11/14]
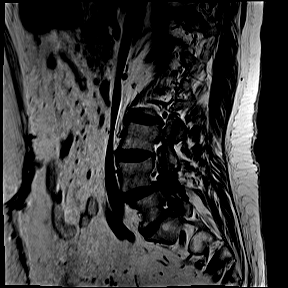
[im 14/14]
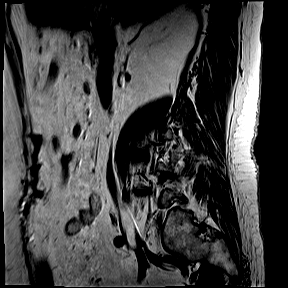

[Series 12: T2 · coronal · 5.0mm · 0.82mm/px · 7 of 18 slices shown (2 of 3)]
[im 1/18]
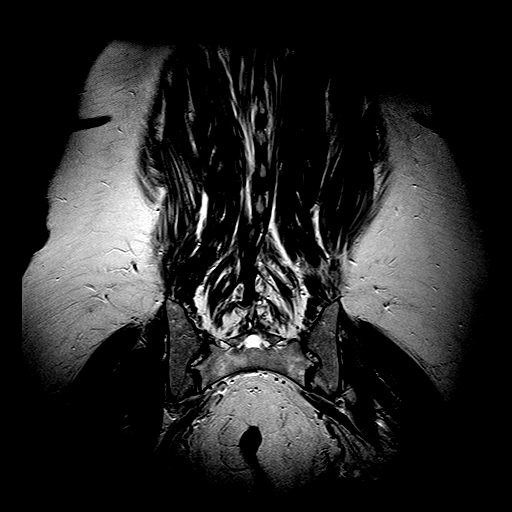
[im 3/18]
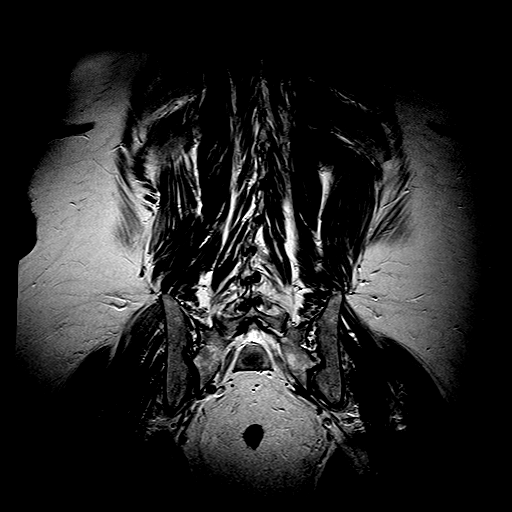
[im 6/18]
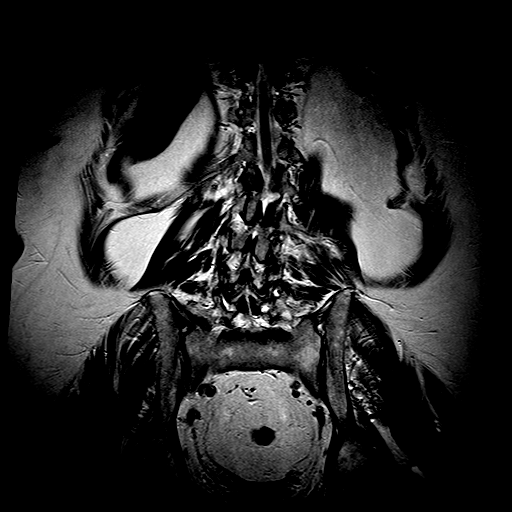
[im 9/18]
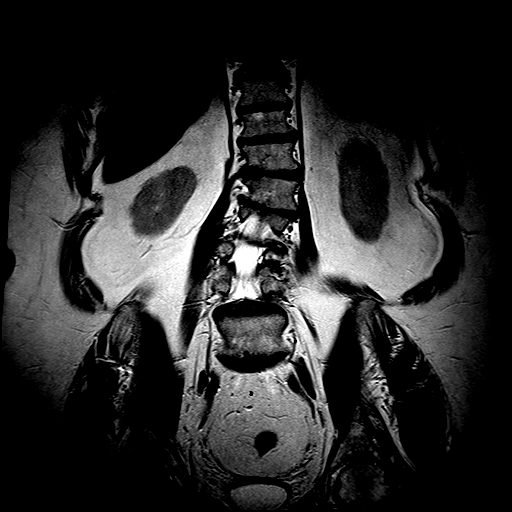
[im 12/18]
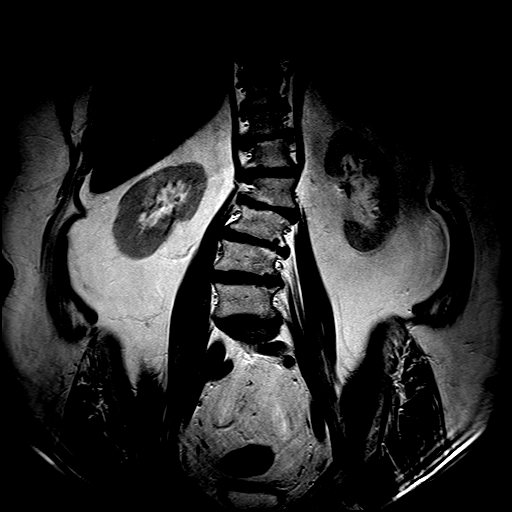
[im 15/18]
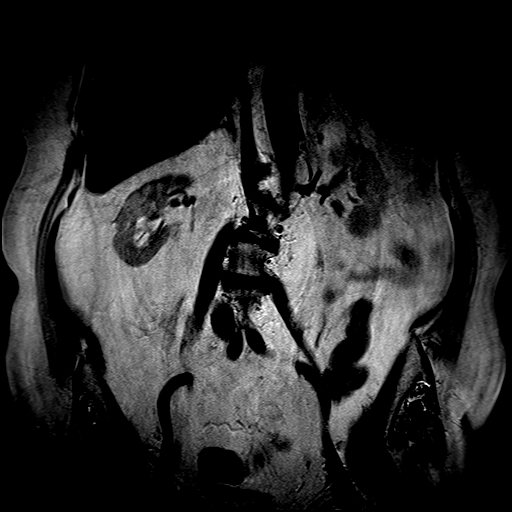
[im 18/18]
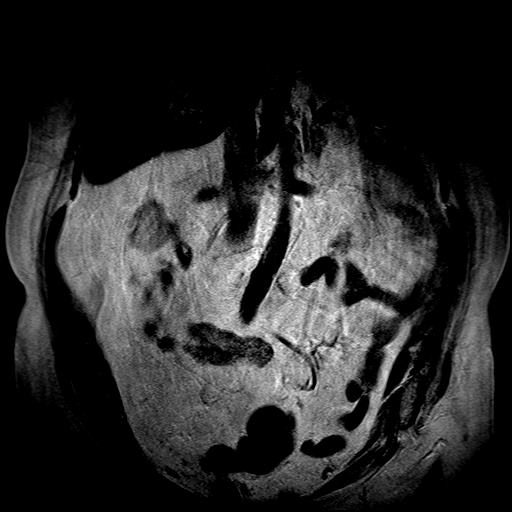

[Series 13: T2 · oblique · 4.0mm · 0.52mm/px · 9 of 29 slices shown (3 of 3)]
[im 1/29]
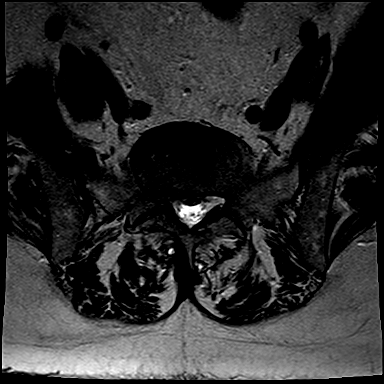
[im 6/29]
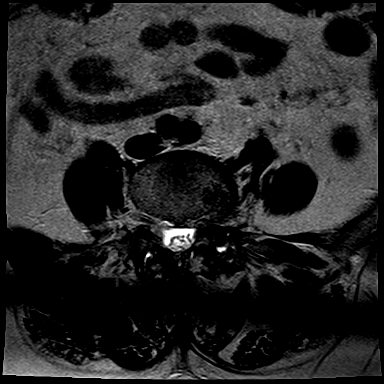
[im 8/29]
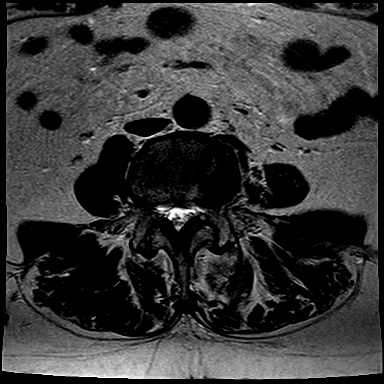
[im 13/29]
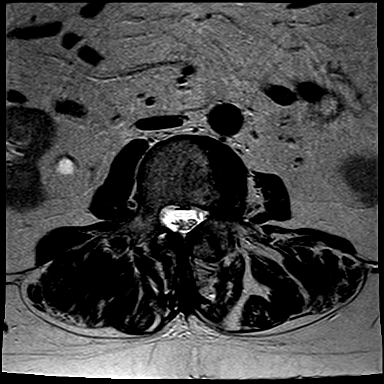
[im 16/29]
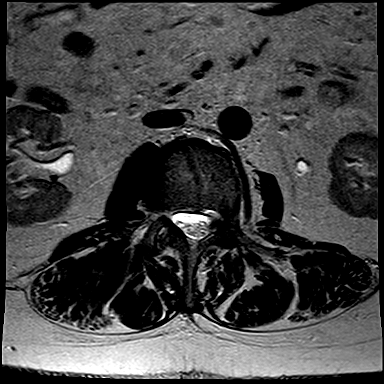
[im 21/29]
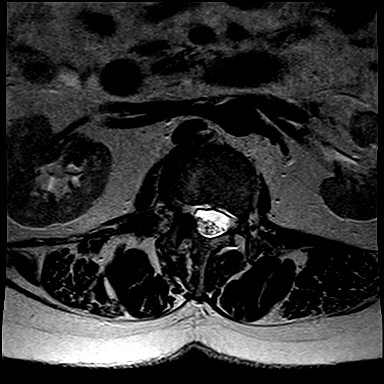
[im 23/29]
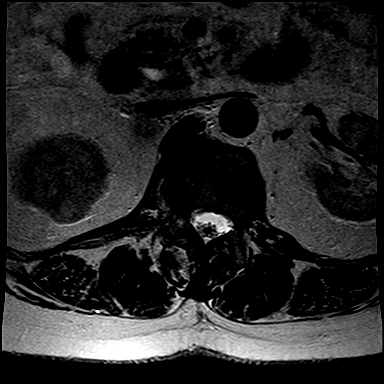
[im 26/29]
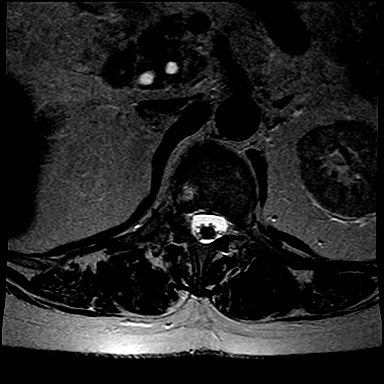
[im 29/29]
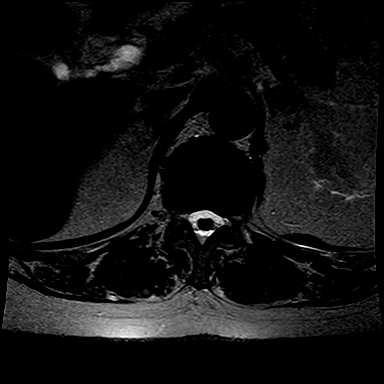

[Series 14: T1 · oblique · 4.0mm · 0.52mm/px · 4 of 29 slices shown (2 of 2)]
[im 1/29]
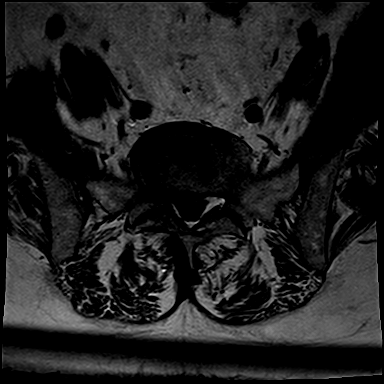
[im 6/29]
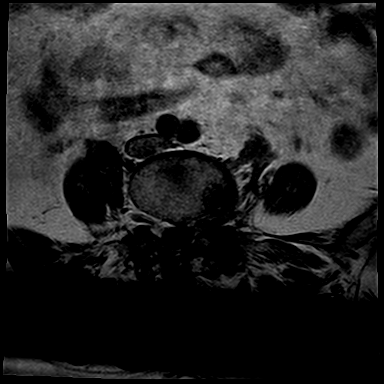
[im 8/29]
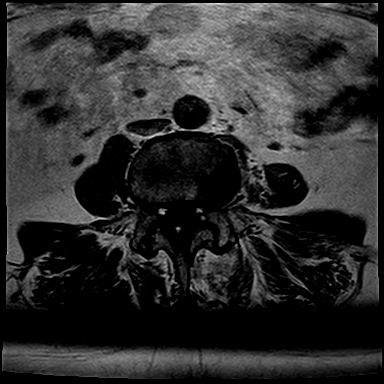
[im 13/29]
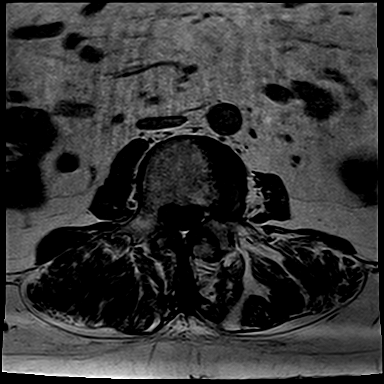

[31 of 48 positions shown; findings below may reference images not displayed]

FINDINGS: There is a mild dextroscoliosis centered at L3-4 disc space level.  Bone marrow signal intensity is normal. There is no acute fracture or subluxation. Distal spinal cord is normal in signal intensity and terminates normally at T12-L1 disc space level. Spinal canal is congenitally narrow.

At T12-L1 level, there is a small broad-based central disc bulge mildly effacing the ventral thecal sac. There is severe right neural foraminal stenosis from facet arthropathy and bulging annulus. 

At L1-2 level, there is a small broad-based central disc bulge resulting in moderate to severe spinal stenosis. There is moderate right neural foraminal stenosis from facet arthropathy and bulging annulus. 

At L2-3 level, there is a small broad-based central disc bulge mildly effacing the ventral thecal sac. There is moderate left and mild right neural foraminal stenosis from facet arthropathy and bulging annulus. 

At L3-4 level, there is minimal retrolisthesis of L3 on L4 vertebral body. There is a small broad-based central disc bulge mildly effacing the ventral thecal sac. There is severe left and moderate right neural foraminal stenosis from facet arthropathy and bulging annulus. 

At L4-5 level, there is minimal anterolisthesis of L4 on L5 vertebral body. There is a small broad-based central disc bulge resulting in severe spinal stenosis. There is severe left and moderate right neural foraminal stenosis from facet arthropathy and bulging annulus. 

At L5-S1 level, there is a small broad-based central disc bulge mildly effacing the ventral thecal sac. There is moderate bilateral neural foraminal stenosis from facet arthropathy and bulging annulus. 

Paraspinal soft tissues are unremarkable.
IMPRESSION: 1. Mild dextroscoliosis centered at L3-4 disc space level.  There is minimal retrolisthesis of L3 on L4 vertebral body and minimal anterolisthesis of L4 on L5 vertebral body. 

2. Small disc bulges at all levels with severe spinal stenosis at L4-5 level and moderate to severe spinal stenosis at L1-2 level. 

3. Multilevel neural foraminal stenosis as detailed above.

## 2021-08-23 ENCOUNTER — Other Ambulatory Visit (HOSPITAL_PSYCHIATRIC): Payer: Self-pay | Admitting: PHYSICIAN ASSISTANT

## 2021-08-23 NOTE — Telephone Encounter (Signed)
Pt discharged and scheduled to follow-up with her PCP. Additional Rx refills denied

## 2021-09-15 ENCOUNTER — Other Ambulatory Visit (HOSPITAL_PSYCHIATRIC): Payer: Self-pay | Admitting: PHYSICIAN ASSISTANT

## 2021-09-15 NOTE — Telephone Encounter (Signed)
Pt was to follow up with her outpatient provider after discharge.

## 2022-01-28 ENCOUNTER — Emergency Department
Admission: EM | Admit: 2022-01-28 | Discharge: 2022-01-28 | Disposition: A | Discharge status: Home / Self Care | Payer: Medicare Other | Attending: Family | Admitting: Family

## 2022-01-28 ENCOUNTER — Other Ambulatory Visit: Payer: Self-pay

## 2022-01-28 ENCOUNTER — Encounter (HOSPITAL_BASED_OUTPATIENT_CLINIC_OR_DEPARTMENT_OTHER): Payer: Self-pay

## 2022-01-28 ENCOUNTER — Emergency Department (HOSPITAL_BASED_OUTPATIENT_CLINIC_OR_DEPARTMENT_OTHER): Payer: Medicare Other

## 2022-01-28 DIAGNOSIS — N390 Urinary tract infection, site not specified: Secondary | ICD-10-CM

## 2022-01-28 DIAGNOSIS — M549 Dorsalgia, unspecified: Secondary | ICD-10-CM

## 2022-01-28 DIAGNOSIS — M5136 Other intervertebral disc degeneration, lumbar region: Secondary | ICD-10-CM

## 2022-01-28 DIAGNOSIS — R350 Frequency of micturition: Secondary | ICD-10-CM | POA: Insufficient documentation

## 2022-01-28 DIAGNOSIS — M5440 Lumbago with sciatica, unspecified side: Secondary | ICD-10-CM

## 2022-01-28 DIAGNOSIS — M545 Low back pain, unspecified: Secondary | ICD-10-CM | POA: Insufficient documentation

## 2022-01-28 DIAGNOSIS — M47814 Spondylosis without myelopathy or radiculopathy, thoracic region: Secondary | ICD-10-CM | POA: Insufficient documentation

## 2022-01-28 LAB — URINALYSIS, MACRO/MICRO
BILIRUBIN: NEGATIVE mg/dL
BLOOD: NEGATIVE mg/dL
GLUCOSE: NEGATIVE mg/dL
KETONES: NEGATIVE mg/dL
LEUKOCYTES: NEGATIVE WBCs/uL
NITRITE: NEGATIVE
PH: 6 (ref 4.6–8.0)
PROTEIN: NEGATIVE mg/dL
SPECIFIC GRAVITY: >=1.03 (ref 1.003–1.035)
UROBILINOGEN: 0.2 mg/dL (ref 0.2–1.0)

## 2022-01-28 LAB — COMPREHENSIVE METABOLIC PANEL, NON-FASTING
ALBUMIN/GLOBULIN RATIO: 0.9 (ref 0.8–1.4)
ALBUMIN: 3.5 g/dL (ref 3.4–5.0)
ALKALINE PHOSPHATASE: 75 U/L (ref 46–116)
ALT (SGPT): 16 U/L (ref <=78)
ANION GAP: 11 mmol/L (ref 4–13)
AST (SGOT): 18 U/L (ref 15–37)
BILIRUBIN TOTAL: 0.4 mg/dL (ref 0.2–1.0)
BUN/CREA RATIO: 14
BUN: 14 mg/dL (ref 7–18)
CALCIUM, CORRECTED: 9.3 mg/dL
CALCIUM: 8.8 mg/dL (ref 8.5–10.1)
CHLORIDE: 105 mmol/L (ref 98–107)
CO2 TOTAL: 25 mmol/L (ref 21–32)
CREATININE: 1.03 mg/dL — ABNORMAL HIGH (ref 0.55–1.02)
ESTIMATED GFR: 59 mL/min/{1.73_m2} — ABNORMAL LOW (ref >59)
GLOBULIN: 3.7
GLUCOSE: 92 mg/dL (ref 74–106)
OSMOLALITY, CALCULATED: 281 mOsm/kg (ref 270–290)
POTASSIUM: 3.8 mmol/L (ref 3.5–5.1)
PROTEIN TOTAL: 7.2 g/dL (ref 6.4–8.2)
SODIUM: 141 mmol/L (ref 136–145)

## 2022-01-28 LAB — CBC WITH DIFF
BASOPHIL #: 0.02 10*3/uL (ref 0.00–0.30)
BASOPHIL %: 0 % (ref 0–3)
EOSINOPHIL #: 0.28 10*3/uL (ref 0.00–0.80)
EOSINOPHIL %: 4 % (ref 0–7)
HCT: 39.4 % (ref 37.0–47.0)
HGB: 13.2 g/dL (ref 12.5–16.0)
LYMPHOCYTE #: 1.56 10*3/uL (ref 1.10–5.00)
LYMPHOCYTE %: 24 % — ABNORMAL LOW (ref 25–45)
MCH: 30.7 pg (ref 27.0–32.0)
MCHC: 33.6 g/dL (ref 32.0–36.0)
MCV: 91.4 fL (ref 78.0–99.0)
MONOCYTE #: 0.45 10*3/uL (ref 0.00–1.30)
MONOCYTE %: 7 % (ref 0–12)
MPV: 7.3 fL — ABNORMAL LOW (ref 7.4–10.4)
NEUTROPHIL #: 4.34 10*3/uL (ref 1.80–8.40)
NEUTROPHIL %: 65 % (ref 40–76)
PLATELETS: 260 10*3/uL (ref 140–440)
RBC: 4.31 10*6/uL (ref 4.20–5.40)
RDW: 17 % — ABNORMAL HIGH (ref 11.6–14.8)
WBC: 6.7 10*3/uL (ref 4.0–10.5)

## 2022-01-28 MED ORDER — CYCLOBENZAPRINE 10 MG TABLET
ORAL_TABLET | ORAL | Status: AC
Start: 2022-01-28 — End: 2022-01-28
  Filled 2022-01-28: qty 1

## 2022-01-28 MED ORDER — BUPRENORPHINE HCL 0.3 MG/ML INJECTION SOLUTION
0.3000 mg | INTRAMUSCULAR | Status: AC
Start: 2022-01-28 — End: 2022-01-28
  Administered 2022-01-28: 0.3 mg via INTRAVENOUS

## 2022-01-28 MED ORDER — KETOROLAC 30 MG/ML (1 ML) INJECTION SOLUTION
INTRAMUSCULAR | Status: AC
Start: 2022-01-28 — End: 2022-01-28
  Filled 2022-01-28: qty 1

## 2022-01-28 MED ORDER — ONDANSETRON 4 MG DISINTEGRATING TABLET
ORAL_TABLET | ORAL | Status: AC
Start: 2022-01-28 — End: 2022-01-28
  Filled 2022-01-28: qty 1

## 2022-01-28 MED ORDER — METHYLPREDNISOLONE 4 MG TABLETS IN A DOSE PACK
ORAL_TABLET | ORAL | 0 refills | Status: DC
Start: 2022-01-28 — End: 2022-02-10

## 2022-01-28 MED ORDER — KETOROLAC 30 MG/ML (1 ML) INJECTION SOLUTION
15.0000 mg | INTRAMUSCULAR | Status: AC
Start: 2022-01-28 — End: 2022-01-28
  Administered 2022-01-28: 15 mg via INTRAVENOUS

## 2022-01-28 MED ORDER — MELOXICAM 7.5 MG TABLET
7.5000 mg | ORAL_TABLET | Freq: Every day | ORAL | 0 refills | Status: AC
Start: 2022-01-28 — End: 2022-02-07

## 2022-01-28 MED ORDER — BUPRENORPHINE HCL 0.3 MG/ML INJECTION SOLUTION
INTRAMUSCULAR | Status: AC
Start: 2022-01-28 — End: 2022-01-28
  Filled 2022-01-28: qty 1

## 2022-01-28 MED ORDER — CYCLOBENZAPRINE 10 MG TABLET
10.0000 mg | ORAL_TABLET | ORAL | Status: AC
Start: 2022-01-28 — End: 2022-01-28
  Administered 2022-01-28: 10 mg via ORAL

## 2022-01-28 MED ORDER — ONDANSETRON 4 MG DISINTEGRATING TABLET
4.0000 mg | ORAL_TABLET | ORAL | Status: AC
Start: 2022-01-28 — End: 2022-01-28
  Administered 2022-01-28: 4 mg via ORAL

## 2022-01-28 MED ORDER — CYCLOBENZAPRINE 10 MG TABLET
10.0000 mg | ORAL_TABLET | Freq: Three times a day (TID) | ORAL | 0 refills | Status: DC | PRN
Start: 2022-01-28 — End: 2022-04-09

## 2022-01-28 MED ORDER — DEXAMETHASONE SODIUM PHOSPHATE (PF) 10 MG/ML INJECTION SOLUTION
INTRAMUSCULAR | Status: AC
Start: 2022-01-28 — End: 2022-01-28
  Filled 2022-01-28: qty 1

## 2022-01-28 MED ORDER — DEXAMETHASONE SODIUM PHOSPHATE (PF) 10 MG/ML INJECTION SOLUTION
10.0000 mg | INTRAMUSCULAR | Status: AC
Start: 2022-01-28 — End: 2022-01-28
  Administered 2022-01-28: 10 mg via INTRAVENOUS

## 2022-01-28 MED ORDER — SODIUM CHLORIDE 0.9 % (FLUSH) INJECTION SYRINGE
3.0000 mL | INJECTION | Freq: Three times a day (TID) | INTRAMUSCULAR | Status: DC
Start: 2022-01-28 — End: 2022-01-28
  Administered 2022-01-28: 0 mL

## 2022-01-28 MED ORDER — SODIUM CHLORIDE 0.9 % (FLUSH) INJECTION SYRINGE
3.0000 mL | INJECTION | INTRAMUSCULAR | Status: DC | PRN
Start: 2022-01-28 — End: 2022-01-28

## 2022-01-28 NOTE — ED Nurses Note (Signed)
Patient resting in bed with eyes closed. No acute distress noted.

## 2022-01-28 NOTE — ED Nurses Note (Signed)
Patient had buprenex for pain earlier in the shift because she states she has someone that could come and pick her up - contacted patients daughter and she states that her mom has her car and that she can't come get her and has no one that can bring her here to pick her mother up.  Daughter states she told her mother not to take anything because they had no way to come and get her.  Patient is still groggy and eyes half way shut at this time.

## 2022-01-28 NOTE — ED Nurses Note (Signed)
Patient discharged home. Reviewed instructions and prescriptions with patient. Questions sufficiently answered as needed. Patient verbalized understanding of instructions and prescriptions. In the event of an emergency, patient instructed to call 911 or go to the nearest emergency room. Patient ambulated off the unit independently.

## 2022-01-28 NOTE — ED Nurses Note (Signed)
Patient stated chronic low back pain more severe today. Stated pain is causing patient to be unsteady on her feet. Stated she is unable to use her cane or go upstairs at her home d/t low back pain. Stated unable to find relief at home. Denies any known injury. Stated had MRI performed at the imaging center. Was told she would need back surgery but patient stated she does not want to "go through with it." Did not take anything today for pain.

## 2022-01-28 NOTE — ED Nurses Note (Signed)
Patient assessed by provider and stated patient was alert and oriented to drive herself home.

## 2022-01-28 NOTE — ED Nurses Note (Signed)
Patient ambulating without assistance. Patient provided with po fluids.

## 2022-01-28 NOTE — ED Nurses Note (Signed)
Patient stated daughter is unable to take her home. Stated she will have to drive herself home.

## 2022-01-28 NOTE — ED Provider Notes (Signed)
[facility], [facility] Emergency Department  ED Primary Provider Note  History of Present Illness   Chief Complaint   Patient presents with    Back Pain     Arrival: The patient arrived by Car  Brooke Maxwell is a 71 y.o. female who had concerns including Back Pain. Lower back pain down both legs. Upper back also hurts.   Urinary frequency denies ability to control urine nor bowels. States had mri not too long ago showing multiple herniated discs.   Review of Systems   Constitutional: No fever, chills or weakness   Skin: No rash or diaphoresis  HENT: No headaches, or congestion  Eyes: No vision changes or photophobia   Cardio: No chest pain, palpitations or leg swelling   Respiratory: No cough, wheezing or SOB  GI:  No nausea, vomiting or stool changes  GU:  No dysuria, hematuria, + increased frequency  MSK: No muscle aches, joint + back pain  Neuro: No seizures, LOC, numbness, tingling, or focal weakness  Psychiatric: No depression, SI or substance abuse  All other systems reviewed and are negative.    Historical Data   History Reviewed This Encounter: all noted and reviewed      Physical Exam   ED Triage Vitals [01/28/22 1111]   BP (Non-Invasive) (!) 145/93   Heart Rate 64   Respiratory Rate 18   Temperature (!) 35.9 C (96.6 F)   SpO2 98 %   Weight 86.2 kg (190 lb)   Height 1.6 m (_0 )       Constitutional:  71 y.o. female who appears in no distress. Normal color, no cyanosis.   HENT:   Head: Normocephalic and atraumatic.   Mouth/Throat: Oropharynx is clear and moist.   Eyes: EOMI, PERRL   Neck: Trachea midline. Neck supple.  Cardiovascular: RRR, No murmurs, rubs or gallops. Intact distal pulses.  Pulmonary/Chest: BS equal bilaterally. No respiratory distress. No wheezes, rales or chest tenderness.   Abdominal: Bowel sounds present and normal. Abdomen soft, no tenderness, no rebound and no guarding.  Back: + midline spinal tenderness, +paraspinal tenderness, no CVA tenderness.            Musculoskeletal: No edema, tenderness or deformity.  Skin: warm and dry. No rash, erythema, pallor or cyanosis  Psychiatric: normal mood and affect. Behavior is normal.   Neurological: Patient keenly alert and responsive, easily able to raise eyebrows, facial muscles/expressions symmetric, speaking in fluent sentences, moving all extremities equally and fully, normal gait  Patient Data     Labs Ordered/Reviewed   COMPREHENSIVE METABOLIC PANEL, NON-FASTING - Abnormal; Notable for the following components:       Result Value    CREATININE 1.03 (*)     ESTIMATED GFR 59 (*)     All other components within normal limits    Narrative:     Estimated Glomerular Filtration Rate (eGFR) is calculated using the CKD-EPI (2021) equation, intended for patients 74 years of age and older. If gender is not documented or "unknown", there will be no eGFR calculation.   URINALYSIS, MACRO/MICRO - Abnormal; Notable for the following components:    COLOR Light Yellow (*)     All other components within normal limits   CBC WITH DIFF - Abnormal; Notable for the following components:    RDW 17.0 (*)     MPV 7.3 (*)     LYMPHOCYTE % 24 (*)     All other components within normal limits  URINALYSIS WITH REFLEX MICROSCOPIC AND CULTURE IF POSITIVE    Narrative:     The following orders were created for panel order URINALYSIS WITH REFLEX MICROSCOPIC AND CULTURE IF POSITIVE.  Procedure                               Abnormality         Status                     ---------                               -----------         ------                     URINALYSIS, MACRO/MICRO[[id number]]      Abnormal            Final result                 Please view results for these tests on the individual orders.   CBC/DIFF    Narrative:     The following orders were created for panel order CBC/DIFF.  Procedure                               Abnormality         Status                     ---------                               -----------         ------                      CBC WITH WJXB[[id number]]                Abnormal            Final result                 Please view results for these tests on the individual orders.     XR LUMBAR SPINE AP AND LAT   Final Result by Edi, Radresults In (10/29 1208)   Degenerative disc disease. There may be some lower lumbar stenosis related to L4-5 listhesis and facet disease.               Radiologist location ID: [id number]         XR THORACIC SPINE   Final Result by Edi, Radresults In (10/29 1209)   Moderate degenerative changes. No acute fracture or subluxation is identified            Radiologist location ID: [place] [facility]   Dif dx of sciatica, ddd, UTI.       Medications Administered in the ED   NS flush syringe (has no administration in time range)   NS flush syringe (has no administration in time range)   dexAMETHasone (PF) 10 mg/mL injection (10 mg Intravenous Given 01/28/22 1216)   ketorolac (TORADOL) 30 mg/mL injection (15 mg Intravenous Given 01/28/22 1216)   cyclobenzaprine (FLEXERIL) tablet (10 mg Oral Given 01/28/22 1216)  buprenorphine (BUPRENEX) 0.3 mg/mL injection (0.3 mg Intravenous Given 01/28/22 1340)   ondansetron (ZOFRAN ODT) rapid dissolve tablet (4 mg Oral Given 01/28/22 1340)     Clinical Impression   DDD (degenerative disc disease), lumbar (Primary)   Upper back pain   Lower back pain   Urinary frequency       Disposition: Discharged

## 2022-01-28 NOTE — ED Triage Notes (Addendum)
In bad pain can't walk in my back hips and legs ongoing for a week and got worse yesterday - took robaxin ibuprofen and didn't help. I use a cane to keep from falling cause pain so bad. Everything just sorta locks up

## 2022-01-28 NOTE — ED Nurses Note (Signed)
Patient ambulated with this nurse approx 20 feet. Provider informed.

## 2022-01-28 NOTE — ED Nurses Note (Signed)
Patient stated daughter is trying to get a ride to be brought to ED to take patient home.

## 2022-01-28 NOTE — ED Nurses Note (Signed)
Provided patient with meal tray and po fluids. Patient stated pain has not subsided. Rated pain 6/10.

## 2022-01-28 NOTE — ED Nurses Note (Signed)
Patient remains very lethargic. Informed patient she is unable to be discharged at this time. Informed patient she will remain in ED unless someone can drive her home or until she is more alert. Provided patient with po fluids and snack.

## 2022-02-10 ENCOUNTER — Other Ambulatory Visit: Payer: Self-pay

## 2022-02-10 ENCOUNTER — Emergency Department
Admission: EM | Admit: 2022-02-10 | Discharge: 2022-02-10 | Disposition: A | Discharge status: Home / Self Care | Payer: Medicare Other | Attending: Family | Admitting: Family

## 2022-02-10 DIAGNOSIS — M545 Low back pain, unspecified: Secondary | ICD-10-CM | POA: Insufficient documentation

## 2022-02-10 DIAGNOSIS — M5136 Other intervertebral disc degeneration, lumbar region: Secondary | ICD-10-CM | POA: Insufficient documentation

## 2022-02-10 MED ORDER — TRAMADOL 50 MG TABLET
1.0000 | ORAL_TABLET | Freq: Four times a day (QID) | ORAL | 0 refills | Status: DC | PRN
Start: 2022-02-10 — End: 2022-04-09

## 2022-02-10 MED ORDER — BUPRENORPHINE HCL 0.3 MG/ML INJECTION SOLUTION
0.3000 mg | INTRAMUSCULAR | Status: AC
Start: 2022-02-10 — End: 2022-02-10
  Administered 2022-02-10: 0.3 mg via INTRAMUSCULAR

## 2022-02-10 MED ORDER — METHOCARBAMOL 750 MG TABLET
750.0000 mg | ORAL_TABLET | Freq: Once | ORAL | Status: AC
Start: 2022-02-10 — End: 2022-02-10
  Administered 2022-02-10: 750 mg via ORAL

## 2022-02-10 MED ORDER — KETOROLAC 30 MG/ML (1 ML) INJECTION SOLUTION
30.0000 mg | INTRAMUSCULAR | Status: AC
Start: 2022-02-10 — End: 2022-02-10
  Administered 2022-02-10: 30 mg via INTRAMUSCULAR

## 2022-02-10 MED ORDER — KETOROLAC 30 MG/ML (1 ML) INJECTION SOLUTION
INTRAMUSCULAR | Status: AC
Start: 2022-02-10 — End: 2022-02-10
  Filled 2022-02-10: qty 1

## 2022-02-10 MED ORDER — METHOCARBAMOL 750 MG TABLET
ORAL_TABLET | ORAL | Status: AC
Start: 2022-02-10 — End: 2022-02-10
  Filled 2022-02-10: qty 1

## 2022-02-10 MED ORDER — ONDANSETRON 4 MG DISINTEGRATING TABLET
ORAL_TABLET | ORAL | Status: AC
Start: 2022-02-10 — End: 2022-02-10
  Filled 2022-02-10: qty 1

## 2022-02-10 MED ORDER — ONDANSETRON 4 MG DISINTEGRATING TABLET
4.0000 mg | ORAL_TABLET | ORAL | Status: AC
Start: 2022-02-10 — End: 2022-02-10
  Administered 2022-02-10: 4 mg via ORAL

## 2022-02-10 MED ORDER — BUPRENORPHINE HCL 0.3 MG/ML INJECTION SOLUTION
INTRAMUSCULAR | Status: AC
Start: 2022-02-10 — End: 2022-02-10
  Filled 2022-02-10: qty 1

## 2022-02-10 NOTE — ED Provider Notes (Signed)
[facility], [facility] Emergency Department  ED Primary Provider Note  History of Present Illness   Chief Complaint   Patient presents with    Low Back Pain     Arrival: The patient arrived by Car  Brooke Maxwell is a 71 y.o. female who had concerns including Low Back Pain. Pt states  lower back pain was recently here an PCP had full work up states needs help with pain until PCP back in town. In process of getting a wheel chair. No bowel or bladder changes no saddle numbness    Review of Systems   Constitutional: No fever, chills or weakness   Skin: No rash or diaphoresis  HENT: No headaches, or congestion  Eyes: No vision changes or photophobia   Cardio: No chest pain, palpitations or leg swelling   Respiratory: No cough, wheezing or SOB  GI:  No nausea, vomiting or stool changes  GU:  No dysuria, hematuria, or increased frequency  MSK: No muscle aches, joint + back pain  Neuro: No seizures, LOC, numbness, tingling, or focal weakness  Psychiatric: No depression, SI or substance abuse  All other systems reviewed and are negative.    Historical Data   History Reviewed This Encounter: all noted and reviewed  Physical Exam   ED Triage Vitals [02/10/22 1103]   BP (Non-Invasive) (!) 156/104   Heart Rate 83   Respiratory Rate 16   Temperature 36.1 C (97 F)   SpO2 100 %   Weight 85 kg (187 lb 8 oz)   Height 1.626 m (5\' 4" )       Constitutional:  71 y.o. female who appears in no distress. Normal color, no cyanosis.   HENT:   Head: Normocephalic and atraumatic.   Mouth/Throat: Oropharynx is clear and moist.   Eyes: EOMI, PERRL   Neck: Trachea midline. Neck supple.  Cardiovascular: RRR, No murmurs, rubs or gallops. Intact distal pulses.  Pulmonary/Chest: BS equal bilaterally. No respiratory distress. No wheezes, rales or chest tenderness.   Abdominal: Bowel sounds present and normal. Abdomen soft, no tenderness, no rebound and no guarding.  Back: No midline spinal tenderness, + lumbar  paraspinal tenderness, no CVA tenderness.           Musculoskeletal: No edema, tenderness or deformity.  Skin: warm and dry. No rash, erythema, pallor or cyanosis  Psychiatric: normal mood and affect. Behavior is normal.   Neurological: Patient keenly alert and responsive, easily able to raise eyebrows, facial muscles/expressions symmetric, speaking in fluent sentences, moving all extremities equally and fully, normal gait  Patient Data   Labs Ordered/Reviewed - No data to display  No orders to display     Medical Decision Making   Diff dx of  sciatica ddd muscle spasm. stenosis      Medications Administered in the ED   ketorolac (TORADOL) 30 mg/mL injection (30 mg IntraMUSCULAR Given 02/10/22 1134)   buprenorphine (BUPRENEX) 0.3 mg/mL injection (0.3 mg IntraMUSCULAR Given 02/10/22 1135)   ondansetron (ZOFRAN ODT) rapid dissolve tablet (4 mg Oral Given 02/10/22 1132)   methocarbamol (ROBAXIN) tablet (750 mg Oral Given 02/10/22 1132)     Clinical Impression   Lower back pain (Primary)   DDD (degenerative disc disease), lumbar       Disposition: Discharged

## 2022-02-10 NOTE — ED Triage Notes (Signed)
Patient reports back pain going into both legs with right leg worse that left leg. Reports being her last week for same problem. Reports she went to her PCP and received steroids and Toradol injections on Tuesday. States today pain is worse and her PCP is out of town

## 2022-04-09 ENCOUNTER — Other Ambulatory Visit: Payer: Self-pay

## 2022-04-09 ENCOUNTER — Emergency Department (EMERGENCY_DEPARTMENT_HOSPITAL): Payer: Medicare Other

## 2022-04-09 ENCOUNTER — Emergency Department
Admission: EM | Admit: 2022-04-09 | Discharge: 2022-04-09 | Disposition: A | Discharge status: Home / Self Care | Payer: Medicare Other | Attending: Emergency Medicine | Admitting: Emergency Medicine

## 2022-04-09 ENCOUNTER — Encounter (HOSPITAL_BASED_OUTPATIENT_CLINIC_OR_DEPARTMENT_OTHER): Payer: Self-pay

## 2022-04-09 DIAGNOSIS — N200 Calculus of kidney: Secondary | ICD-10-CM

## 2022-04-09 DIAGNOSIS — Z87440 Personal history of urinary (tract) infections: Secondary | ICD-10-CM | POA: Insufficient documentation

## 2022-04-09 DIAGNOSIS — N3000 Acute cystitis without hematuria: Secondary | ICD-10-CM | POA: Insufficient documentation

## 2022-04-09 DIAGNOSIS — N23 Unspecified renal colic: Secondary | ICD-10-CM

## 2022-04-09 DIAGNOSIS — R109 Unspecified abdominal pain: Secondary | ICD-10-CM

## 2022-04-09 DIAGNOSIS — Z87442 Personal history of urinary calculi: Secondary | ICD-10-CM

## 2022-04-09 DIAGNOSIS — R1031 Right lower quadrant pain: Secondary | ICD-10-CM | POA: Insufficient documentation

## 2022-04-09 LAB — CBC WITH DIFF
BASOPHIL #: 0.01 10*3/uL (ref 0.00–0.30)
BASOPHIL %: 0 % (ref 0–3)
EOSINOPHIL #: 0.43 10*3/uL (ref 0.00–0.80)
EOSINOPHIL %: 5 % (ref 0–7)
HCT: 39.6 % (ref 37.0–47.0)
HGB: 13.7 g/dL (ref 12.5–16.0)
LYMPHOCYTE #: 1.78 10*3/uL (ref 1.10–5.00)
LYMPHOCYTE %: 21 % — ABNORMAL LOW (ref 25–45)
MCH: 32.5 pg — ABNORMAL HIGH (ref 27.0–32.0)
MCHC: 34.6 g/dL (ref 32.0–36.0)
MCV: 93.9 fL (ref 78.0–99.0)
MONOCYTE #: 0.69 10*3/uL (ref 0.00–1.30)
MONOCYTE %: 8 % (ref 0–12)
MPV: 7.6 fL (ref 7.4–10.4)
NEUTROPHIL #: 5.76 10*3/uL (ref 1.80–8.40)
NEUTROPHIL %: 66 % (ref 40–76)
PLATELETS: 304 10*3/uL (ref 140–440)
RBC: 4.22 10*6/uL (ref 4.20–5.40)
RDW: 16.2 % — ABNORMAL HIGH (ref 11.6–14.8)
WBC: 8.7 10*3/uL (ref 4.0–10.5)

## 2022-04-09 LAB — COMPREHENSIVE METABOLIC PANEL, NON-FASTING
ALBUMIN/GLOBULIN RATIO: 1.1 (ref 0.8–1.4)
ALBUMIN: 3.9 g/dL (ref 3.4–5.0)
ALKALINE PHOSPHATASE: 76 U/L (ref 46–116)
ALT (SGPT): 12 U/L (ref <=78)
ANION GAP: 8 mmol/L (ref 4–13)
AST (SGOT): 31 U/L (ref 15–37)
BILIRUBIN TOTAL: 0.3 mg/dL (ref 0.2–1.0)
BUN/CREA RATIO: 10
BUN: 10 mg/dL (ref 7–18)
CALCIUM, CORRECTED: 9.3 mg/dL
CALCIUM: 9.2 mg/dL (ref 8.5–10.1)
CHLORIDE: 105 mmol/L (ref 98–107)
CO2 TOTAL: 27 mmol/L (ref 21–32)
CREATININE: 1.03 mg/dL — ABNORMAL HIGH (ref 0.55–1.02)
ESTIMATED GFR: 59 mL/min/{1.73_m2} — ABNORMAL LOW (ref >59)
GLOBULIN: 3.4
GLUCOSE: 97 mg/dL (ref 74–106)
OSMOLALITY, CALCULATED: 278 mOsm/kg (ref 270–290)
POTASSIUM: 3.2 mmol/L — ABNORMAL LOW (ref 3.5–5.1)
PROTEIN TOTAL: 7.3 g/dL (ref 6.4–8.2)
SODIUM: 140 mmol/L (ref 136–145)

## 2022-04-09 LAB — URINALYSIS, MACRO/MICRO
BILIRUBIN: NEGATIVE mg/dL
BLOOD: NEGATIVE mg/dL
GLUCOSE: NEGATIVE mg/dL
KETONES: NEGATIVE mg/dL
LEUKOCYTES: NEGATIVE WBCs/uL
NITRITE: NEGATIVE
PH: 6 (ref 4.6–8.0)
PROTEIN: NEGATIVE mg/dL
SPECIFIC GRAVITY: 1.02 (ref 1.003–1.035)
UROBILINOGEN: 0.2 mg/dL (ref 0.2–1.0)

## 2022-04-09 LAB — PT/INR
INR: 1.24 — ABNORMAL HIGH (ref 0.88–1.10)
PROTHROMBIN TIME: 14.3 seconds — ABNORMAL HIGH (ref 9.8–12.7)

## 2022-04-09 LAB — PTT (PARTIAL THROMBOPLASTIN TIME): APTT: 33.2 seconds — ABNORMAL HIGH (ref 22.0–31.7)

## 2022-04-09 LAB — LIPASE: LIPASE: 54 U/L (ref 11–82)

## 2022-04-09 MED ORDER — LEVOFLOXACIN 500 MG TABLET
ORAL_TABLET | ORAL | Status: AC
Start: 2022-04-09 — End: 2022-04-09
  Filled 2022-04-09: qty 1

## 2022-04-09 MED ORDER — CLONIDINE HCL 0.2 MG TABLET
ORAL_TABLET | ORAL | Status: AC
Start: 2022-04-09 — End: 2022-04-09
  Filled 2022-04-09: qty 1

## 2022-04-09 MED ORDER — KETOROLAC 10 MG TABLET
10.0000 mg | ORAL_TABLET | Freq: Four times a day (QID) | ORAL | 0 refills | Status: DC | PRN
Start: 2022-04-09 — End: 2022-07-29

## 2022-04-09 MED ORDER — CLONIDINE HCL 0.2 MG TABLET
0.2000 mg | ORAL_TABLET | ORAL | Status: AC
Start: 2022-04-09 — End: 2022-04-09
  Administered 2022-04-09: 0.2 mg via ORAL

## 2022-04-09 MED ORDER — PHENAZOPYRIDINE 100 MG TABLET
ORAL_TABLET | ORAL | Status: AC
Start: 2022-04-09 — End: 2022-04-09
  Filled 2022-04-09: qty 2

## 2022-04-09 MED ORDER — PHENAZOPYRIDINE 200 MG TABLET
200.0000 mg | ORAL_TABLET | Freq: Three times a day (TID) | ORAL | 0 refills | Status: DC
Start: 2022-04-09 — End: 2023-02-05

## 2022-04-09 MED ORDER — KETOROLAC 30 MG/ML (1 ML) INJECTION SOLUTION
30.0000 mg | INTRAMUSCULAR | Status: AC
Start: 2022-04-09 — End: 2022-04-09
  Administered 2022-04-09: 30 mg via INTRAVENOUS

## 2022-04-09 MED ORDER — KETOROLAC 30 MG/ML (1 ML) INJECTION SOLUTION
INTRAMUSCULAR | Status: AC
Start: 2022-04-09 — End: 2022-04-09
  Filled 2022-04-09: qty 1

## 2022-04-09 MED ORDER — SODIUM CHLORIDE 0.9 % IV BOLUS
1000.0000 mL | INJECTION | Status: AC
Start: 2022-04-09 — End: 2022-04-09
  Administered 2022-04-09: 1000 mL via INTRAVENOUS
  Administered 2022-04-09: 0 mL via INTRAVENOUS

## 2022-04-09 MED ORDER — LEVOFLOXACIN 500 MG TABLET
500.0000 mg | ORAL_TABLET | Freq: Every day | ORAL | 0 refills | Status: AC
Start: 2022-04-09 — End: 2022-04-19

## 2022-04-09 MED ORDER — TRAMADOL 50 MG TABLET
50.0000 mg | ORAL_TABLET | ORAL | Status: DC
Start: 2022-04-09 — End: 2022-04-09

## 2022-04-09 MED ORDER — LEVOFLOXACIN 500 MG TABLET
500.0000 mg | ORAL_TABLET | ORAL | Status: DC
Start: 2022-04-09 — End: 2022-04-09
  Administered 2022-04-09: 500 mg via ORAL

## 2022-04-09 MED ORDER — PHENAZOPYRIDINE 100 MG TABLET
200.0000 mg | ORAL_TABLET | ORAL | Status: AC
Start: 2022-04-09 — End: 2022-04-09
  Administered 2022-04-09: 200 mg via ORAL

## 2022-04-09 NOTE — ED Provider Notes (Signed)
[facility], [facility] Emergency Department  ED Primary Provider Note  History of Present Illness   Chief Complaint   Patient presents with    Abdominal Pain     Brooke Maxwell is a 72 y.o. female who had concerns including Abdominal Pain.  Arrival: The patient arrived by Car complaining right CVA tenderness wrapping around the abdomen into the right lower quadrant as well as right suprapubic area.  Patient states the pain started on Saturday.  She states it has been continuous since then.  She has been complaining of increased frequency on urination with minimal pain when she urinates.  She denies any nausea or vomiting.  Patient denies any fever chills.  No hematuria.  No diarrhea.  No black or bloody stools.  Patient does have a history of kidney stones in the past.    HPI  Review of Systems   Review of Systems   Constitutional:  Positive for activity change and appetite change. Negative for chills and fever.   HENT:  Negative for ear pain and sore throat.    Eyes:  Negative for pain and visual disturbance.   Respiratory:  Negative for cough and shortness of breath.    Cardiovascular:  Negative for chest pain and palpitations.   Gastrointestinal:  Positive for abdominal pain. Negative for vomiting.   Genitourinary:  Positive for dysuria, flank pain, frequency and urgency. Negative for hematuria.   Musculoskeletal:  Negative for arthralgias and back pain.   Skin:  Negative for color change and rash.   Neurological:  Negative for seizures and syncope.   All other systems reviewed and are negative.     Historical Data   History Reviewed This Encounter:     Physical Exam   ED Triage Vitals [04/09/22 1507]   BP (Non-Invasive) (!) 183/111   Heart Rate 79   Respiratory Rate 16   Temperature 36.2 C (97.1 F)   SpO2 98 %   Weight 79.4 kg (175 lb)   Height 1.626 m (5\' 4" )     Physical Exam  Vitals and nursing note reviewed.   Constitutional:       General: She is not in acute distress.      Appearance: She is well-developed. She is obese.   HENT:      Head: Normocephalic and atraumatic.      Right Ear: External ear normal.      Left Ear: External ear normal.      Nose: Nose normal.      Mouth/Throat:      Mouth: Mucous membranes are dry.   Eyes:      Extraocular Movements: Extraocular movements intact.      Conjunctiva/sclera: Conjunctivae normal.      Pupils: Pupils are equal, round, and reactive to light.   Cardiovascular:      Rate and Rhythm: Normal rate and regular rhythm.      Pulses: Normal pulses.      Heart sounds: Normal heart sounds. No murmur heard.  Pulmonary:      Effort: Pulmonary effort is normal. No respiratory distress.      Breath sounds: Normal breath sounds.   Abdominal:      General: Bowel sounds are normal.      Palpations: Abdomen is soft.      Tenderness: There is abdominal tenderness. There is right CVA tenderness.      Comments: Positive tenderness over the right lower quadrant and right suprapubic area.  No guarding  or rebound.  Positive right CVA tenderness.   Musculoskeletal:         General: No swelling. Normal range of motion.      Cervical back: Normal range of motion and neck supple.   Skin:     General: Skin is warm and dry.      Capillary Refill: Capillary refill takes less than 2 seconds.   Neurological:      General: No focal deficit present.      Mental Status: She is alert and oriented to person, place, and time.   Psychiatric:         Mood and Affect: Mood normal.         Behavior: Behavior normal.         Thought Content: Thought content normal.         Judgment: Judgment normal.       Patient Data     Labs Ordered/Reviewed   URINALYSIS, MACRO/MICRO - Abnormal; Notable for the following components:       Result Value    COLOR Light Yellow (*)     All other components within normal limits   COMPREHENSIVE METABOLIC PANEL, NON-FASTING - Abnormal; Notable for the following components:    POTASSIUM 3.2 (*)     CREATININE 1.03 (*)     ESTIMATED GFR 59 (*)     All  other components within normal limits    Narrative:     Estimated Glomerular Filtration Rate (eGFR) is calculated using the CKD-EPI (2021) equation, intended for patients 46 years of age and older. If gender is not documented or "unknown", there will be no eGFR calculation.   PT/INR - Abnormal; Notable for the following components:    PROTHROMBIN TIME 14.3 (*)     INR 1.24 (*)     All other components within normal limits    Narrative:     INR OF 2.0-3.0  RECOMMENDED FOR: PROPHYLAXIS/TREATMENT OF VENEOUS THROMBOSIS, PULMONARY EMBOLISM, PREVENTION OF SYSTEMIC EMBOLISM FROM ATRIAL FIBRILATION, MYOCARDIAL INFARCTION.    INR OF 2.5-3.5  RECOMMENDED FOR MECHANICAL PROSTHETIC HEART VALVES, RECURRENT SYSTEMIC EMBOLISM, RECURRENT MYOCARDIAL INFARCTION.     PTT (PARTIAL THROMBOPLASTIN TIME) - Abnormal; Notable for the following components:    APTT 33.2 (*)     All other components within normal limits   CBC WITH DIFF - Abnormal; Notable for the following components:    MCH 32.5 (*)     RDW 16.2 (*)     LYMPHOCYTE % 21 (*)     All other components within normal limits   URINALYSIS WITH REFLEX MICROSCOPIC AND CULTURE IF POSITIVE    Narrative:     The following orders were created for panel order URINALYSIS WITH REFLEX MICROSCOPIC AND CULTURE IF POSITIVE.  Procedure                               Abnormality         Status                     ---------                               -----------         ------                     URINALYSIS, MACRO/MICRO[[id number]]  Abnormal            Final result                 Please view results for these tests on the individual orders.   CBC/DIFF    Narrative:     The following orders were created for panel order CBC/DIFF.  Procedure                               Abnormality         Status                     ---------                               -----------         ------                     CBC WITH PYKD[[id number]]                Abnormal            Final result                 Please  view results for these tests on the individual orders.   LIPASE   URINALYSIS WITH REFLEX MICROSCOPIC AND CULTURE IF POSITIVE    Narrative:     The following orders were created for panel order URINALYSIS WITH REFLEX MICROSCOPIC AND CULTURE IF POSITIVE.  Procedure                               Abnormality         Status                     ---------                               -----------         ------                     URINALYSIS, MACRO/MICRO[[id number]]                                                       Please view results for these tests on the individual orders.   URINALYSIS, MACRO/MICRO     CT ABDOMEN PELVIS WO IV CONTRAST   Final Result by Edi, Radresults In (01/08 1638)   1. Solitary approximately 1-2 mm nonobstructing left lower pole renal calculus new from 11/13/2020.    2. Equivocal findings of cystitis. Please correlate with urinalysis/culture.   3. No bowel obstruction, pneumoperitoneum or free fluid.   4. Bibasilar pulmonary subsegmental atelectasis.   5. Postsurgical changes overall similar to 11/13/2020.         One or more dose reduction techniques were used (e.g., Automated exposure control, adjustment of the mA and/or kV according to patient size, use of iterative reconstruction technique).         Radiologist location ID: [id number]  Medical Decision Making        Medical Decision Making  Patient is 72 year old white female complaining right CVA tenderness radiating down the abdomen into the right lower quadrant and into the right suprapubic area.  Patient is complaining of increased frequency with dysuria.  She denies any nausea vomiting or diarrhea.  She denies any hematuria or black bloody stools.  Patient has a history of kidney stones in the past as well as UTIs.  She denies any fever chills at this time.  She stated the pain started on Saturday.  Patient denies any vaginal discharge.  Patient will have an IV placed for hydration as well as labs and CT scan of her abdomen  pelvis to rule out kidney stone.  Differential includes UTI, colitis, diverticulitis, pyelonephritis.  Patient will be treated once the results are back and then discharged home.  Patient will follow up with her primary care physician in the next 3-5 days.    Amount and/or Complexity of Data Reviewed  Labs: ordered.  Radiology: ordered.    Risk  Prescription drug management.             Medications Administered in the ED   NS bolus infusion 1,000 mL (1,000 mL Intravenous New Bag/New Syringe 04/09/22 1545)   phenazopyridine (PYRIDIUM) tablet (has no administration in time range)   levoFLOXacin (LEVAQUIN) tablet (has no administration in time range)   ketorolac (TORADOL) 30 mg/mL injection (30 mg Intravenous Given 04/09/22 1545)     Clinical Impression   Right lower quadrant abdominal pain (Primary)   Acute cystitis without hematuria   Acute right flank pain   Renal colic on right side       Disposition: Discharged               Clinical Impression   Right lower quadrant abdominal pain (Primary)   Acute cystitis without hematuria   Acute right flank pain   Renal colic on right side       Current Discharge Medication List        START taking these medications    Details   ketorolac tromethamine (TORADOL) 10 mg Oral Tablet Take 1 Tablet (10 mg total) by mouth Every 6 hours as needed for Pain  Qty: 20 Tablet, Refills: 0      levoFLOXacin (LEVAQUIN) 500 mg Oral Tablet Take 1 Tablet (500 mg total) by mouth Once a day for 10 days  Qty: 10 Tablet, Refills: 0      phenazopyridine (PYRIDIUM) 200 mg Oral Tablet Take 1 Tablet (200 mg total) by mouth Three times a day  Qty: 9 Tablet, Refills: 0

## 2022-04-09 NOTE — ED Triage Notes (Signed)
Patient c/o RLQ pain and right flank pain x1 day. Denies any other symptoms. Hx of kidney stones

## 2022-04-15 ENCOUNTER — Other Ambulatory Visit: Payer: Self-pay

## 2022-04-15 ENCOUNTER — Emergency Department (HOSPITAL_BASED_OUTPATIENT_CLINIC_OR_DEPARTMENT_OTHER): Payer: Medicare Other

## 2022-04-15 ENCOUNTER — Encounter (HOSPITAL_BASED_OUTPATIENT_CLINIC_OR_DEPARTMENT_OTHER): Payer: Self-pay

## 2022-04-15 ENCOUNTER — Emergency Department
Admission: EM | Admit: 2022-04-15 | Discharge: 2022-04-15 | Disposition: A | Discharge status: Home / Self Care | Payer: Medicare Other | Attending: Family | Admitting: Family

## 2022-04-15 DIAGNOSIS — I10 Essential (primary) hypertension: Secondary | ICD-10-CM | POA: Insufficient documentation

## 2022-04-15 DIAGNOSIS — R519 Headache, unspecified: Secondary | ICD-10-CM | POA: Insufficient documentation

## 2022-04-15 DIAGNOSIS — Z1152 Encounter for screening for COVID-19: Secondary | ICD-10-CM | POA: Insufficient documentation

## 2022-04-15 DIAGNOSIS — N390 Urinary tract infection, site not specified: Secondary | ICD-10-CM | POA: Insufficient documentation

## 2022-04-15 DIAGNOSIS — R1030 Lower abdominal pain, unspecified: Secondary | ICD-10-CM | POA: Insufficient documentation

## 2022-04-15 DIAGNOSIS — Z8711 Personal history of peptic ulcer disease: Secondary | ICD-10-CM | POA: Insufficient documentation

## 2022-04-15 DIAGNOSIS — R109 Unspecified abdominal pain: Secondary | ICD-10-CM

## 2022-04-15 LAB — URINALYSIS, MACRO/MICRO
BILIRUBIN: NEGATIVE mg/dL
BLOOD: NEGATIVE mg/dL
GLUCOSE: NEGATIVE mg/dL
KETONES: NEGATIVE mg/dL
LEUKOCYTES: NEGATIVE WBCs/uL
NITRITE: POSITIVE — AB
PH: 6 (ref 4.6–8.0)
PROTEIN: 30 mg/dL — AB
SPECIFIC GRAVITY: >=1.03 (ref 1.003–1.035)
UROBILINOGEN: 0.2 mg/dL (ref 0.2–1.0)

## 2022-04-15 LAB — COMPREHENSIVE METABOLIC PANEL, NON-FASTING
ALBUMIN/GLOBULIN RATIO: 1.1 (ref 0.8–1.4)
ALBUMIN: 4.2 g/dL (ref 3.4–5.0)
ALKALINE PHOSPHATASE: 80 U/L (ref 46–116)
ALT (SGPT): 19 U/L (ref <=78)
ANION GAP: 11 mmol/L (ref 4–13)
AST (SGOT): 23 U/L (ref 15–37)
BILIRUBIN TOTAL: 0.5 mg/dL (ref 0.2–1.0)
BUN/CREA RATIO: 13
BUN: 15 mg/dL (ref 7–18)
CALCIUM, CORRECTED: 9 mg/dL
CALCIUM: 9.2 mg/dL (ref 8.5–10.1)
CHLORIDE: 105 mmol/L (ref 98–107)
CO2 TOTAL: 24 mmol/L (ref 21–32)
CREATININE: 1.18 mg/dL — ABNORMAL HIGH (ref 0.55–1.02)
ESTIMATED GFR: 50 mL/min/{1.73_m2} — ABNORMAL LOW (ref >59)
GLOBULIN: 3.8
GLUCOSE: 105 mg/dL (ref 74–106)
OSMOLALITY, CALCULATED: 281 mOsm/kg (ref 270–290)
POTASSIUM: 3.8 mmol/L (ref 3.5–5.1)
PROTEIN TOTAL: 8 g/dL (ref 6.4–8.2)
SODIUM: 140 mmol/L (ref 136–145)

## 2022-04-15 LAB — CBC WITH DIFF
BASOPHIL #: 0.01 10*3/uL (ref 0.00–0.30)
BASOPHIL %: 0 % (ref 0–3)
EOSINOPHIL #: 0.7 10*3/uL (ref 0.00–0.80)
EOSINOPHIL %: 5 % (ref 0–7)
HCT: 47.1 % — ABNORMAL HIGH (ref 37.0–47.0)
HGB: 15.7 g/dL (ref 12.5–16.0)
LYMPHOCYTE #: 1.64 10*3/uL (ref 1.10–5.00)
LYMPHOCYTE %: 13 % — ABNORMAL LOW (ref 25–45)
MCH: 31.7 pg (ref 27.0–32.0)
MCHC: 33.4 g/dL (ref 32.0–36.0)
MCV: 94.9 fL (ref 78.0–99.0)
MONOCYTE #: 0.8 10*3/uL (ref 0.00–1.30)
MONOCYTE %: 6 % (ref 0–12)
MPV: 7.7 fL (ref 7.4–10.4)
NEUTROPHIL #: 9.92 10*3/uL — ABNORMAL HIGH (ref 1.80–8.40)
NEUTROPHIL %: 76 % (ref 40–76)
PLATELETS: 357 10*3/uL (ref 140–440)
RBC: 4.96 10*6/uL (ref 4.20–5.40)
RDW: 16.9 % — ABNORMAL HIGH (ref 11.6–14.8)
WBC: 13.1 10*3/uL — ABNORMAL HIGH (ref 4.0–10.5)

## 2022-04-15 LAB — COVID-19, FLU A/B, RSV RAPID BY PCR
INFLUENZA VIRUS TYPE A: NOT DETECTED
INFLUENZA VIRUS TYPE B: NOT DETECTED
RESPIRATORY SYNCTIAL VIRUS (RSV): NOT DETECTED
SARS-CoV-2: NOT DETECTED

## 2022-04-15 LAB — URINALYSIS, MICROSCOPIC

## 2022-04-15 LAB — AMYLASE: AMYLASE: 78 U/L (ref 25–115)

## 2022-04-15 MED ORDER — SODIUM CHLORIDE 0.9 % (FLUSH) INJECTION SYRINGE
3.0000 mL | INJECTION | INTRAMUSCULAR | Status: DC | PRN
Start: 2022-04-15 — End: 2022-04-15

## 2022-04-15 MED ORDER — LEVOFLOXACIN 750 MG TABLET
750.0000 mg | ORAL_TABLET | ORAL | Status: DC
Start: 2022-04-15 — End: 2022-04-15
  Administered 2022-04-15: 750 mg via ORAL

## 2022-04-15 MED ORDER — DICYCLOMINE 20 MG TABLET
ORAL_TABLET | ORAL | Status: AC
Start: 2022-04-15 — End: 2022-04-15
  Filled 2022-04-15: qty 1

## 2022-04-15 MED ORDER — LISINOPRIL 20 MG TABLET
20.0000 mg | ORAL_TABLET | Freq: Every day | ORAL | 0 refills | Status: DC
Start: 2022-04-15 — End: 2024-02-24

## 2022-04-15 MED ORDER — ONDANSETRON 4 MG DISINTEGRATING TABLET
4.0000 mg | ORAL_TABLET | Freq: Three times a day (TID) | ORAL | 0 refills | Status: DC | PRN
Start: 2022-04-15 — End: 2023-11-11

## 2022-04-15 MED ORDER — LEVOFLOXACIN 750 MG TABLET
ORAL_TABLET | ORAL | Status: AC
Start: 2022-04-15 — End: 2022-04-15
  Filled 2022-04-15: qty 1

## 2022-04-15 MED ORDER — DICYCLOMINE 10 MG CAPSULE
10.0000 mg | ORAL_CAPSULE | ORAL | Status: AC
Start: 2022-04-15 — End: 2022-04-15
  Administered 2022-04-15: 10 mg via ORAL
  Filled 2022-04-15: qty 1

## 2022-04-15 MED ORDER — DICYCLOMINE 10 MG CAPSULE
10.0000 mg | ORAL_CAPSULE | Freq: Four times a day (QID) | ORAL | 0 refills | Status: DC
Start: 2022-04-15 — End: 2024-02-24

## 2022-04-15 MED ORDER — NITROFURANTOIN MONOHYDRATE/MACROCRYSTALS 100 MG CAPSULE
100.0000 mg | ORAL_CAPSULE | Freq: Two times a day (BID) | ORAL | 0 refills | Status: AC
Start: 2022-04-15 — End: 2022-04-22

## 2022-04-15 MED ORDER — HYDRALAZINE 20 MG/ML INJECTION SOLUTION
20.0000 mg | INTRAMUSCULAR | Status: AC
Start: 2022-04-15 — End: 2022-04-15
  Administered 2022-04-15: 20 mg via INTRAVENOUS

## 2022-04-15 MED ORDER — SODIUM CHLORIDE 0.9 % (FLUSH) INJECTION SYRINGE
3.0000 mL | INJECTION | Freq: Three times a day (TID) | INTRAMUSCULAR | Status: DC
Start: 2022-04-15 — End: 2022-04-15

## 2022-04-15 MED ORDER — ONDANSETRON HCL (PF) 4 MG/2 ML INJECTION SOLUTION
INTRAMUSCULAR | Status: AC
Start: 2022-04-15 — End: 2022-04-15
  Filled 2022-04-15: qty 2

## 2022-04-15 MED ORDER — ONDANSETRON HCL (PF) 4 MG/2 ML INJECTION SOLUTION
4.0000 mg | INTRAMUSCULAR | Status: AC
Start: 2022-04-15 — End: 2022-04-15
  Administered 2022-04-15: 4 mg via INTRAVENOUS

## 2022-04-15 MED ORDER — MORPHINE 2 MG/ML INJECTION WRAPPER
INJECTION | INTRAMUSCULAR | Status: AC
Start: 2022-04-15 — End: 2022-04-15
  Filled 2022-04-15: qty 1

## 2022-04-15 MED ORDER — HYDRALAZINE 20 MG/ML INJECTION SOLUTION
INTRAMUSCULAR | Status: AC
Start: 2022-04-15 — End: 2022-04-15
  Filled 2022-04-15: qty 1

## 2022-04-15 MED ORDER — MORPHINE 2 MG/ML INJECTION WRAPPER
2.0000 mg | INJECTION | INTRAMUSCULAR | Status: AC
Start: 2022-04-15 — End: 2022-04-15
  Administered 2022-04-15: 2 mg via INTRAVENOUS

## 2022-04-15 MED ORDER — SODIUM CHLORIDE 0.9 % IV BOLUS
1000.0000 mL | INJECTION | Status: AC
Start: 2022-04-15 — End: 2022-04-15
  Administered 2022-04-15: 1000 mL via INTRAVENOUS

## 2022-04-15 NOTE — ED Provider Notes (Signed)
[facility], [facility] Emergency Department  ED Primary Provider Note  History of Present Illness   Chief Complaint   Patient presents with    Vomiting    Diarrhea     Arrival: The patient arrived by Car  Brooke Maxwell is a 72 y.o. female who had concerns including Vomiting and Diarrhea. Nvd dysuria lower abd pain. Recently treated for UTI. Denies cp nor sob,. This am lips tingling denies other co. Denies diff walking speaking nor talking     Review of Systems   Constitutional: No fever, chills or weakness   Skin: No rash or diaphoresis  HENT: No headaches, or congestion  Eyes: No vision changes or photophobia   Cardio: No chest pain, palpitations or leg swelling   Respiratory: No cough, wheezing or SOB  GI:  +nausea, vomiting stool changes  GU:  No dysuria, hematuria, or increased frequency  MSK: No muscle aches, joint or back pain  Neuro: No seizures, LOC, numbness, +tingling lips, no  focal weakness  Psychiatric: No depression, SI or substance abuse  All other systems reviewed and are negative.    Historical Data   History Reviewed This Encounter:all noted and reviewed    Physical Exam   ED Triage Vitals [04/15/22 1315]   BP (Non-Invasive) (!) 159/110   Heart Rate (!) 101   Respiratory Rate 20   Temperature 36.4 C (97.6 F)   SpO2 99 %   Weight 79.4 kg (175 lb)   Height 1.575 m (5\' 2" )       Constitutional:  72 y.o. female who appears in no distress. Normal color, no cyanosis.   HENT:   Head: Normocephalic and atraumatic.   Mouth/Throat: Oropharynx is clear and dry.   Eyes: EOMI, PERRL   Neck: Trachea midline. Neck supple.  Cardiovascular: RRR, No murmurs, rubs or gallops. Intact distal pulses.  Pulmonary/Chest: BS equal bilaterally. No respiratory distress. No wheezes, rales or chest tenderness.   Abdominal: Bowel sounds present and normal. Abdomen soft, no tenderness, no rebound and no guarding.  Back: No midline spinal tenderness, no paraspinal tenderness, no CVA tenderness.            Musculoskeletal: No edema, tenderness or deformity.  Skin: warm and dry. No rash, erythema, pallor or cyanosis  Psychiatric: normal mood and affect. Behavior is normal.   Neurological: Patient keenly alert and responsive, easily able to raise eyebrows, facial muscles/expressions symmetric, speaking in fluent sentences, moving all extremities equally and fully, normal gait  Patient Data     Labs Ordered/Reviewed   COMPREHENSIVE METABOLIC PANEL, NON-FASTING - Abnormal; Notable for the following components:       Result Value    CREATININE 1.18 (*)     ESTIMATED GFR 50 (*)     All other components within normal limits    Narrative:     Estimated Glomerular Filtration Rate (eGFR) is calculated using the CKD-EPI (2021) equation, intended for patients 72 years of age and older. If gender is not documented or "unknown", there will be no eGFR calculation.   CBC WITH DIFF - Abnormal; Notable for the following components:    WBC 13.1 (*)     HCT 47.1 (*)     RDW 16.9 (*)     LYMPHOCYTE % 13 (*)     NEUTROPHIL # 9.92 (*)     All other components within normal limits   URINALYSIS, MACRO/MICRO - Abnormal; Notable for the following components:    COLOR Dark  Yellow (*)     NITRITE Positive (*)     PROTEIN 30 (*)     All other components within normal limits    Narrative:     Urine lid was not on correctly.  UA leaked into bag   URINALYSIS, MICROSCOPIC - Abnormal; Notable for the following components:    BACTERIA Occasional (*)     MUCOUS Occasional (*)     All other components within normal limits   AMYLASE - Normal   COVID-19, FLU A/B, RSV RAPID BY PCR - Normal    Narrative:     Results are for the simultaneous qualitative identification of SARS-CoV-2 (formerly 2019-nCoV), Influenza A, Influenza B, and RSV RNA. These etiologic agents are generally detectable in nasopharyngeal and nasal swabs during the ACUTE PHASE of infection. Hence, this test is intended to be performed on respiratory specimens collected from  individuals with signs and symptoms of upper respiratory tract infection who meet Centers for Disease Control and Prevention (CDC) clinical and/or epidemiological criteria for Coronavirus Disease 2019 (COVID-19) testing. CDC COVID-19 criteria for testing on human specimens is available at [facility] webpage information for Healthcare Professionals: Coronavirus Disease 2019 (COVID-19) ([email or web address]).     False-negative results may occur if the virus has genomic mutations, insertions, deletions, or rearrangements or if performed very early in the course of illness. Otherwise, negative results indicate virus specific RNA targets are not detected, however negative results do not preclude SARS-CoV-2 infection/COVID-19, Influenza, or Respiratory syncytial virus infection. Results should not be used as the sole basis for patient management decisions. Negative results must be combined with clinical observations, patient history, and epidemiological information. If upper respiratory tract infection is still suspected based on exposure history together with other clinical findings, re-testing should be considered.    Disclaimer:   This assay has been authorized by FDA under an Emergency Use Authorization for use in laboratories certified under the Clinical Laboratory Improvement Amendments of 1988 (CLIA), 42 U.S.C. [phone], to perform high complexity tests. The impacts of vaccines, antiviral therapeutics, antibiotics, chemotherapeutic or immunosuppressant drugs have not been evaluated.     Test methodology:   Cepheid Xpert Xpress SARS-CoV-2/Flu/RSV Assay real-time polymerase chain reaction (RT-PCR) test on the GeneXpert Dx and Xpert Xpress systems.   URINE CULTURE,ROUTINE   CBC/DIFF    Narrative:     The following orders were created for panel order CBC/DIFF.  Procedure                               Abnormality         Status                     ---------                                -----------         ------                     CBC WITH YNWG[[id number]]                Abnormal            Final result                 Please view results for these tests on the individual orders.   URINALYSIS WITH REFLEX MICROSCOPIC AND CULTURE IF  POSITIVE    Narrative:     The following orders were created for panel order URINALYSIS WITH REFLEX MICROSCOPIC AND CULTURE IF POSITIVE.  Procedure                               Abnormality         Status                     ---------                               -----------         ------                     URINALYSIS, MACRO/MICRO[[id number]]      Abnormal            Final result               URINALYSIS, MICROSCOPIC[[id number]]      Abnormal            Final result                 Please view results for these tests on the individual orders.     CT ABDOMEN PELVIS WO IV CONTRAST   Final Result by Edi, Radresults In (01/14 1423)   NO ACUTE FINDINGS AT THE ABDOMEN OR PELVIS ON NONCONTRAST CT.          One or more dose reduction techniques were used (e.g., Automated exposure control, adjustment of the mA and/or kV according to patient size, use of iterative reconstruction technique).         Radiologist location ID: [id number]         CT BRAIN WO IV CONTRAST   Final Result by Edi, Radresults In (01/14 1419)   NO ACUTE FINDINGS         One or more dose reduction techniques were used (e.g., Automated exposure control, adjustment of the mA and/or kV according to patient size, use of iterative reconstruction technique).         Radiologist location ID: [id number] Decision Making   Diff dx of viral syndrome , colitis. Diverticulitis. TIA dehydration hypertension.       Medications Administered in the ED   NS flush syringe (has no administration in time range)   NS flush syringe (has no administration in time range)   levoFLOXacin (LEVAQUIN) tablet (750 mg Oral Given 04/15/22 1452)   NS bolus infusion 1,000 mL (1,000 mL Intravenous New Bag/New Syringe  04/15/22 1420)   ondansetron (ZOFRAN) 2 mg/mL injection (4 mg Intravenous Given 04/15/22 1422)   morphine 2 mg/mL injection (2 mg Intravenous Given 04/15/22 1426)   hydrALAZINE (APRESOLINE) injection 20 mg (20 mg Intravenous Given 04/15/22 1453)   dicyclomine (BENTYL) capsule (10 mg Oral Given 04/15/22 1600)     Clinical Impression   UTI (urinary tract infection) (Primary)   Hypertension, unspecified type   Abdominal pain, unspecified abdominal location   Headache       Disposition: Discharged

## 2022-04-15 NOTE — ED Triage Notes (Addendum)
Patient states abdominal pain started last night, center of abdomen and lower right abdomen. Was treated for a UTI last week, finished antibiotics. Nausea/vomiting/diarrhea started this morning. Also states yesterday she noticed her lips were intermittently numb. HX stomach ulcers.

## 2022-04-17 LAB — URINE CULTURE,ROUTINE: URINE CULTURE: NO GROWTH

## 2022-05-02 IMAGING — DX XRAY SHOULDER MINIMUM 2 VIEW RT
1 series · 2 of 2 positions shown · non-contrast
Comparison: None available.

﻿EXAM:  [id number]   XRAY SHOULDER MINIMUM 2 VIEW RT
INDICATION: Right shoulder pain. Diminished range of motion.
TECHNIQUE: Two views.

[Series 1: apobl · 0.14mm/px · 2 of 2 slices shown]
[im 1/2]
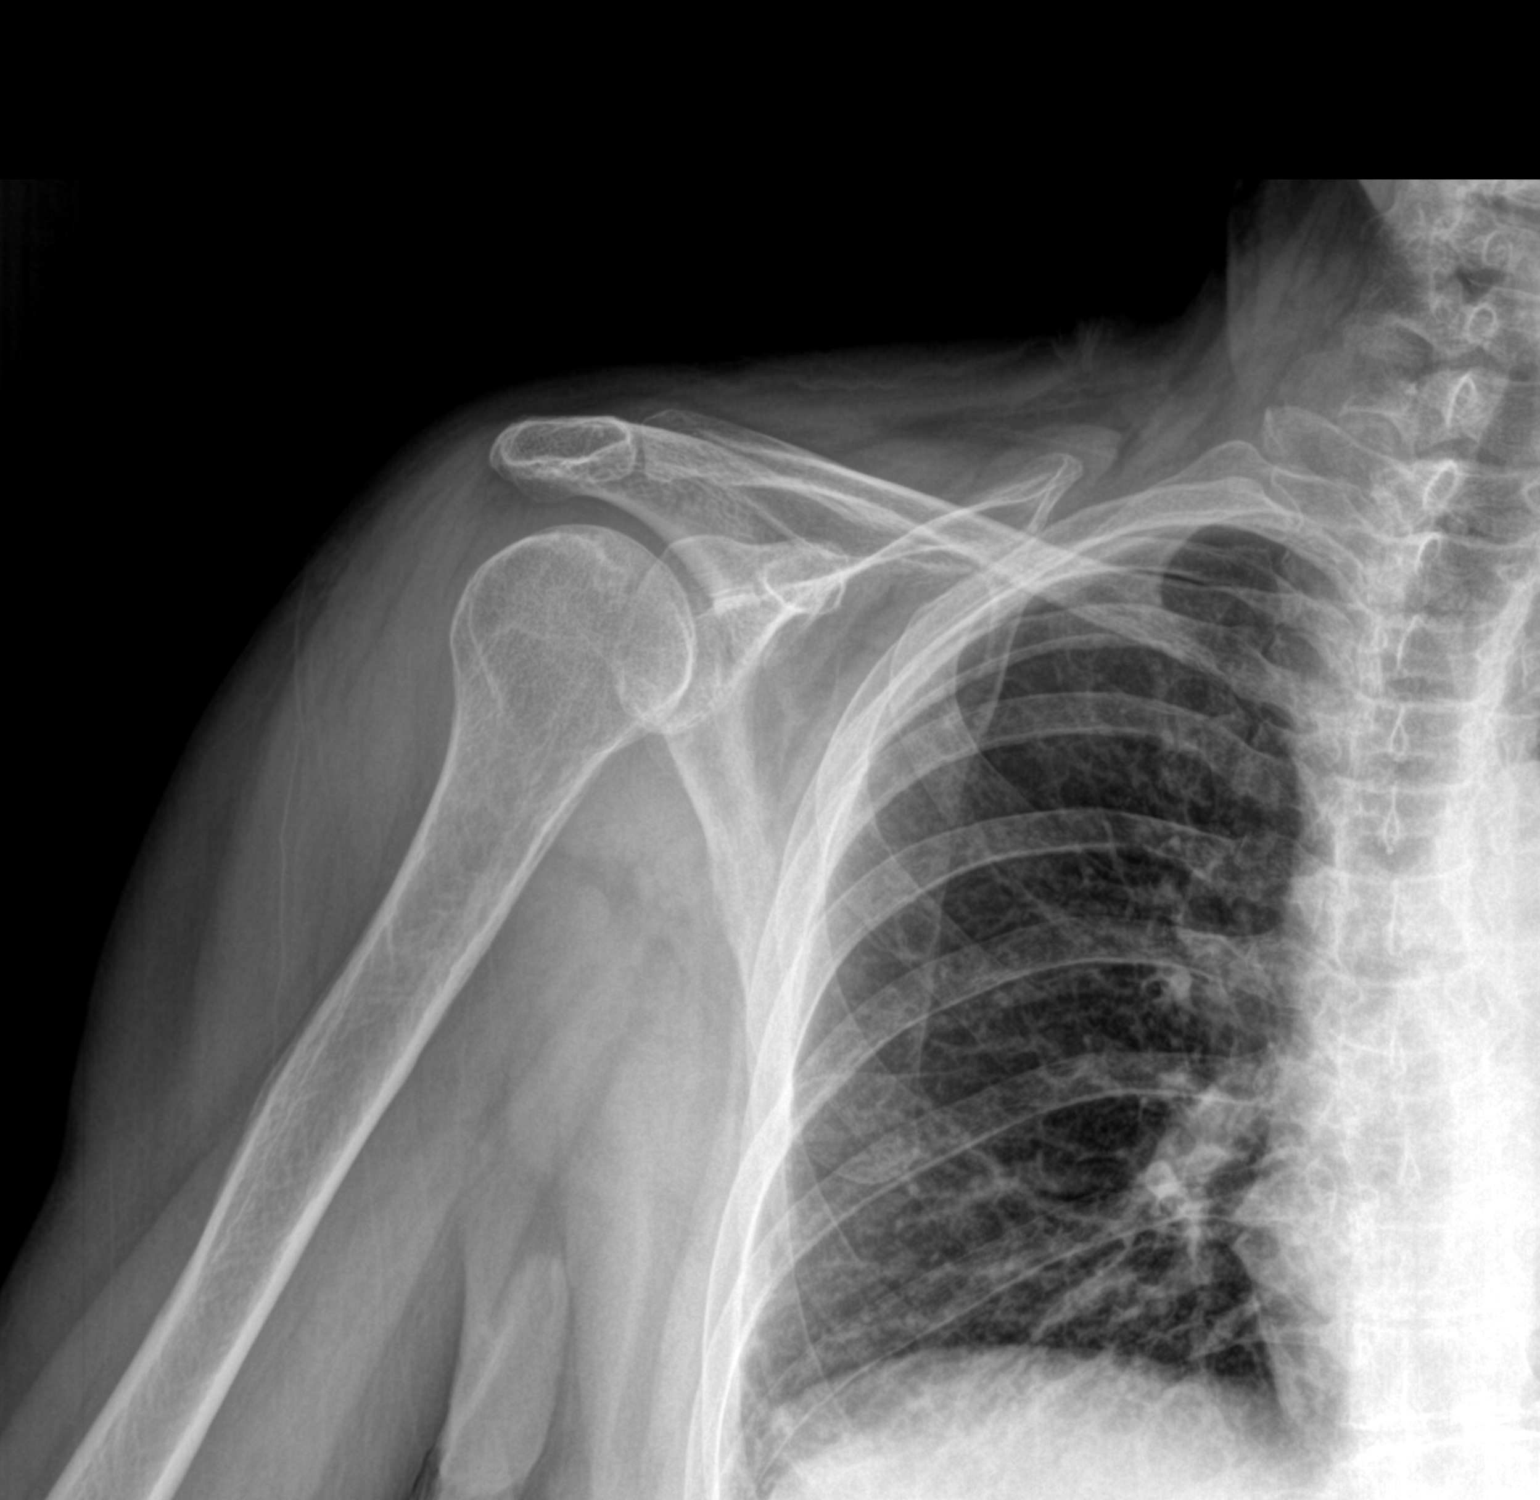
[im 2/2]
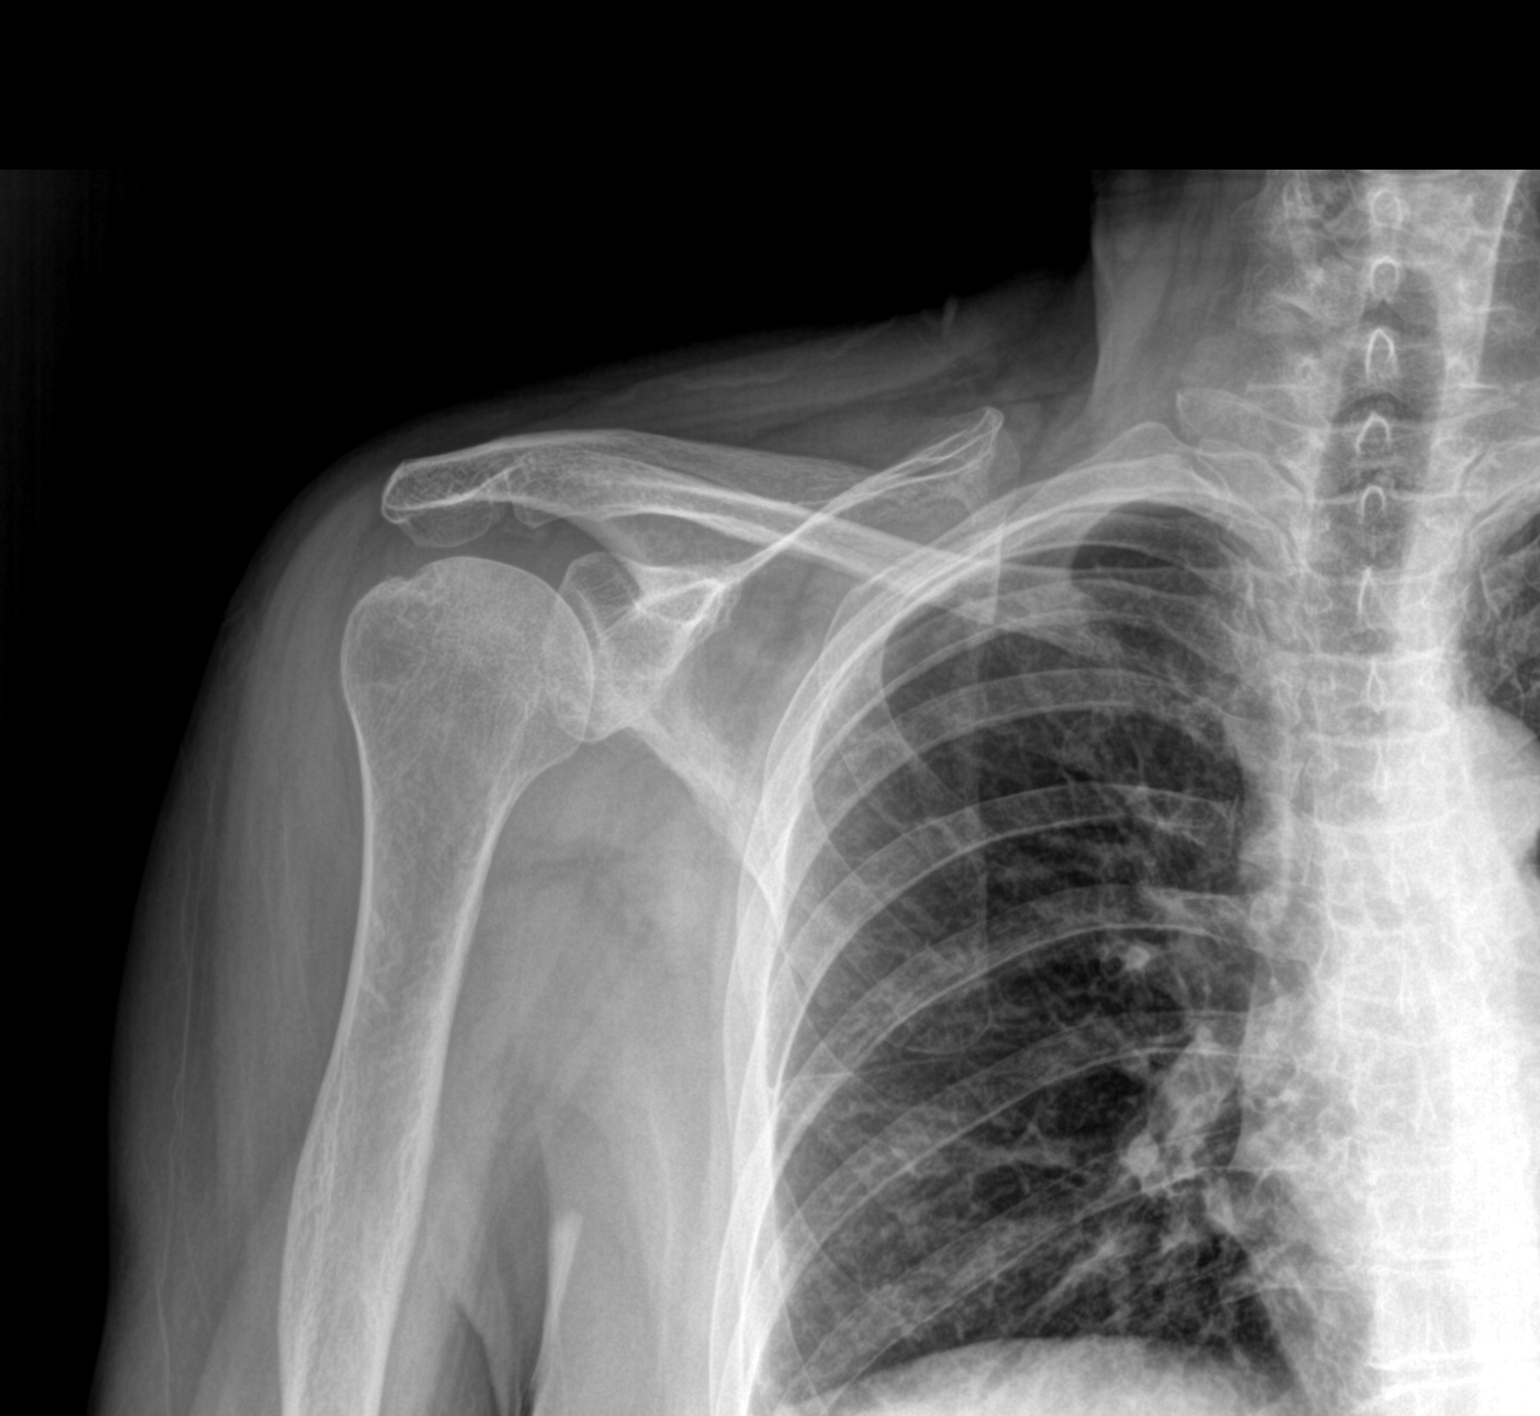

[2 of 2 positions shown; findings below may reference images not displayed]

FINDINGS: No acute fractures or dislocations are seen at the right shoulder.  Hook shaped acromion process is mildly impinging on the subacromion space. Soft tissues are normal.
IMPRESSION: No acute findings. Hook shaped acromion process is impinging on the subacromion space.  If symptoms are persistent and not responding to conservative treatment, additional evaluation can be considered with MRI.

## 2022-06-19 IMAGING — MR MRI SHOULDER RT W/O CONTRAST
7 series · 40 of 40 positions shown · non-contrast
Comparison: None available.

﻿EXAM:  [id number]   MRI SHOULDER RT W/O CONTRAST
INDICATION: Right shoulder pain, limited range of motion.
TECHNIQUE: Noncontrast multiplanar, multisequence MRI was performed.

[Series 6: T1 · oblique · right · 3.5mm · 0.33mm/px · 6 of 18 slices shown]
[im 1/18]
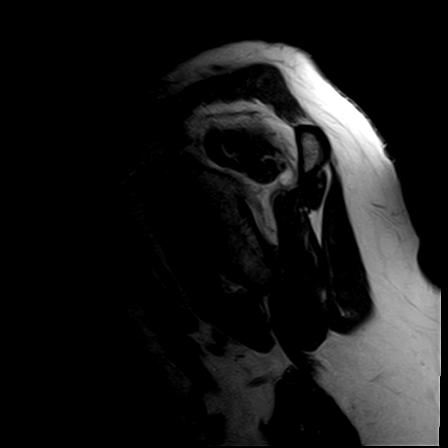
[im 4/18]
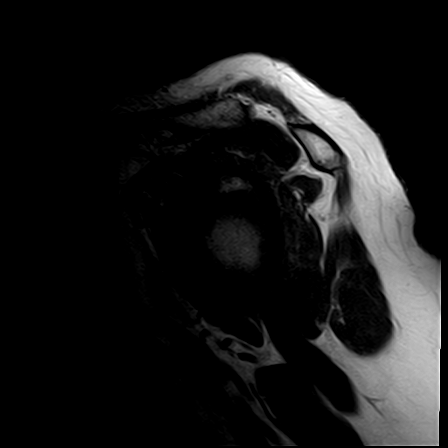
[im 7/18]
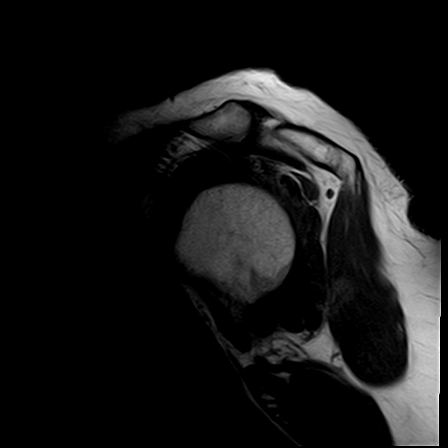
[im 11/18]
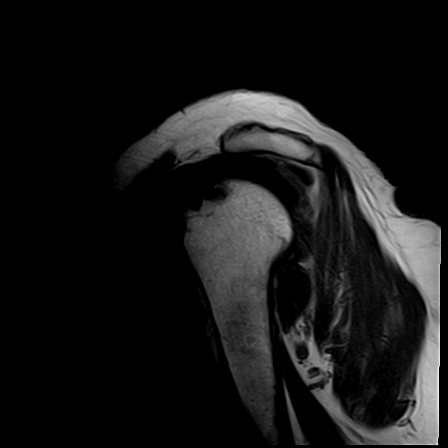
[im 14/18]
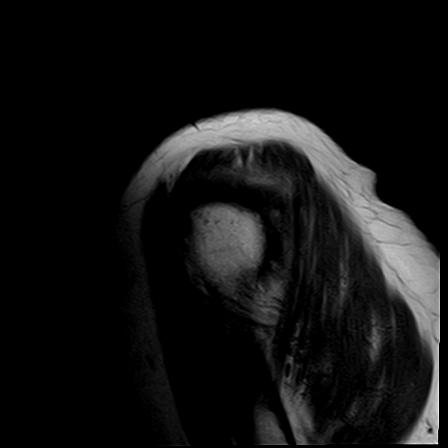
[im 18/18]
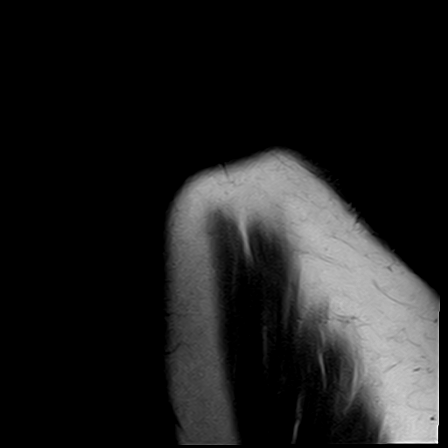

[Series 7: STIR · oblique · right · 3.5mm · 0.47mm/px · 6 of 18 slices shown (1 of 2)]
[im 1/18]
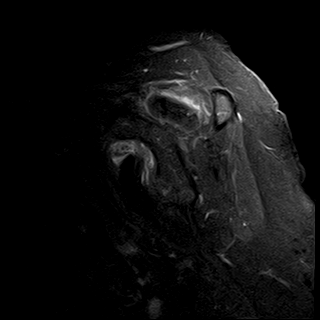
[im 4/18]
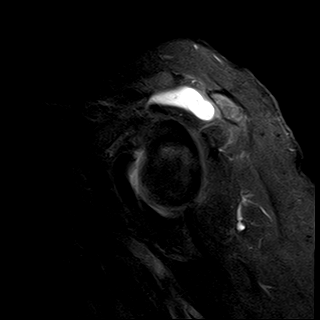
[im 7/18]
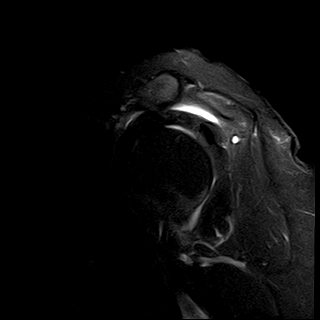
[im 11/18]
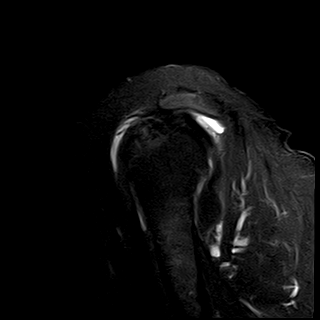
[im 14/18]
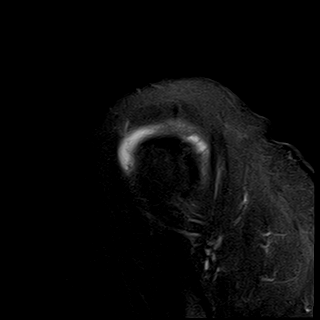
[im 18/18]
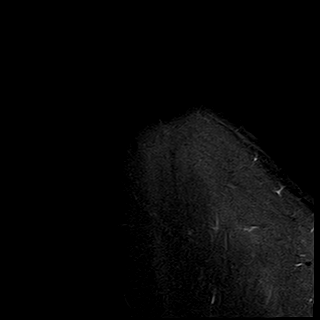

[Series 8: PD fat-sat · axial · right · 4.0mm · 0.50mm/px · z∈[-15,+54]mm · 6 of 18 slices shown (1 of 2)]
[im 1/18]
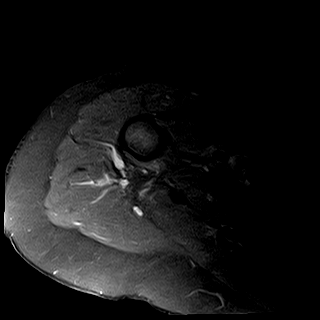
[im 4/18]
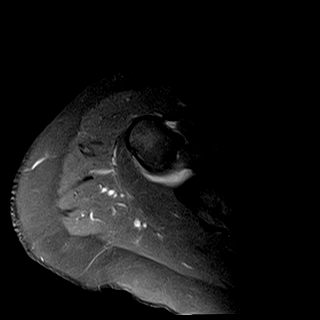
[im 7/18]
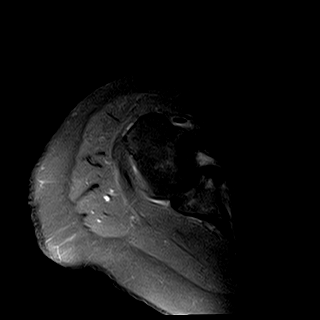
[im 11/18]
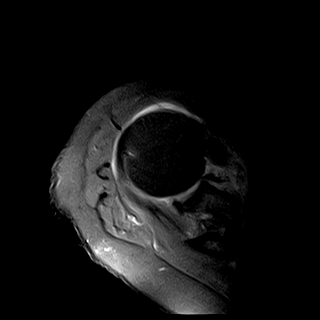
[im 14/18]
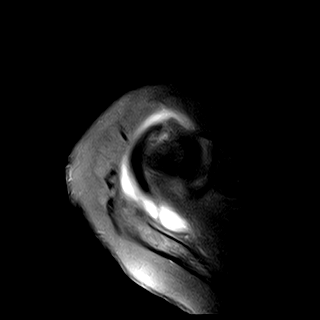
[im 18/18]
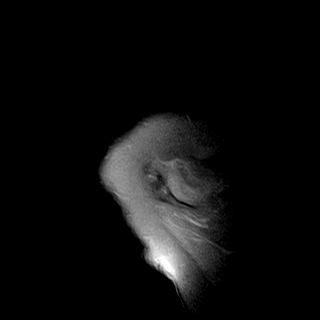

[Series 9: T2 fat-sat · axial · right · 4.0mm · 0.42mm/px · z∈[-27,+66]mm · 7 of 24 slices shown]
[im 1/24]
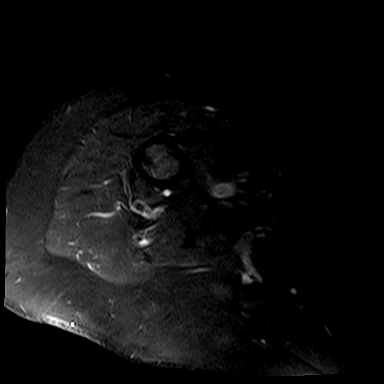
[im 4/24]
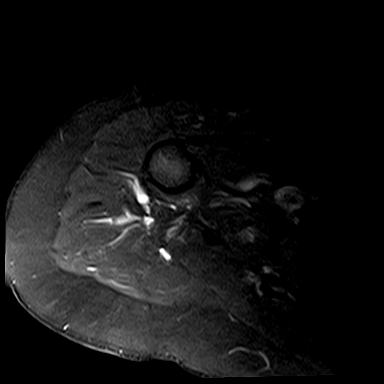
[im 8/24]
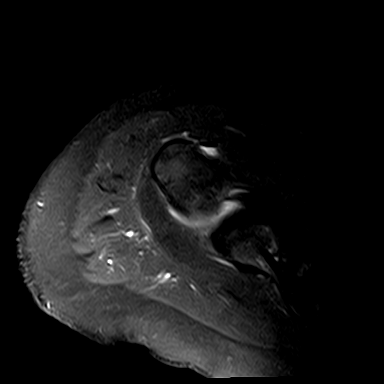
[im 12/24]
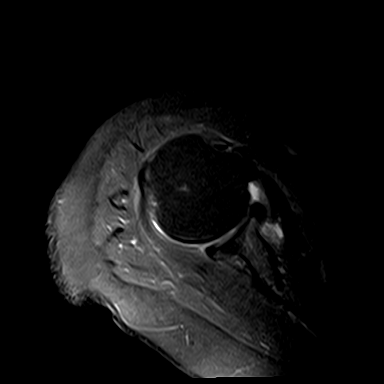
[im 16/24]
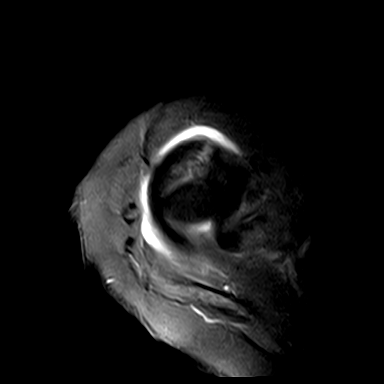
[im 20/24]
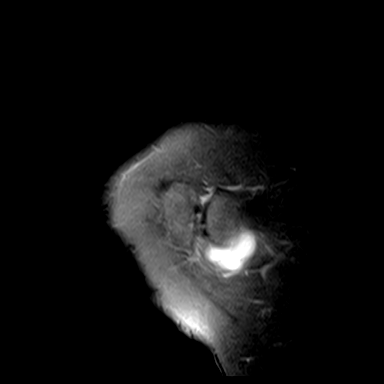
[im 24/24]
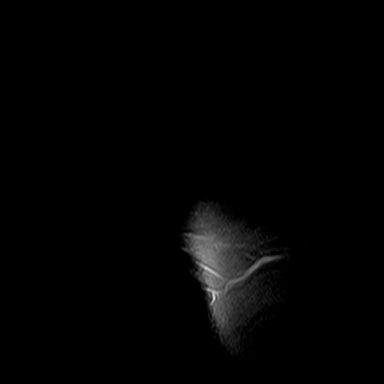

[Series 10: PD fat-sat · oblique · right · 3.5mm · 0.47mm/px · 5 of 18 slices shown (2 of 2)]
[im 1/18]
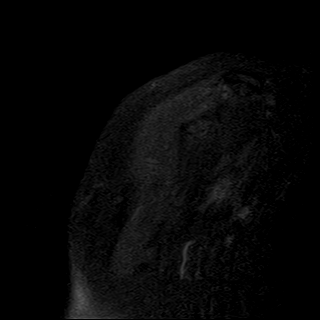
[im 5/18]
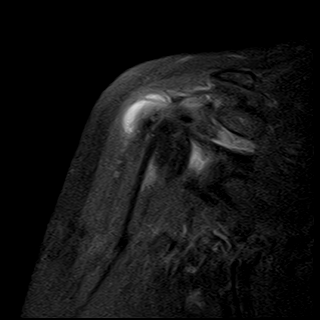
[im 9/18]
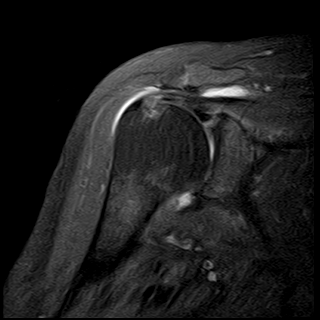
[im 13/18]
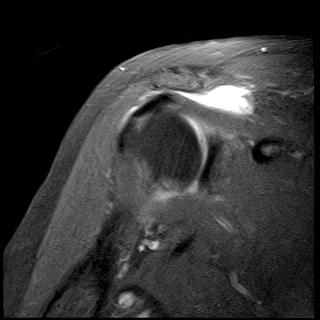
[im 18/18]
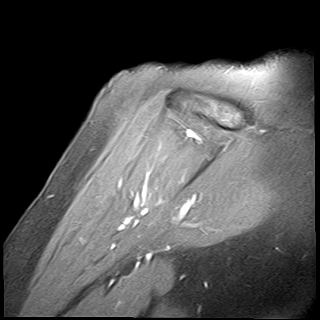

[Series 11: STIR · oblique · right · 3.5mm · 0.47mm/px · 5 of 18 slices shown (2 of 2)]
[im 1/18]
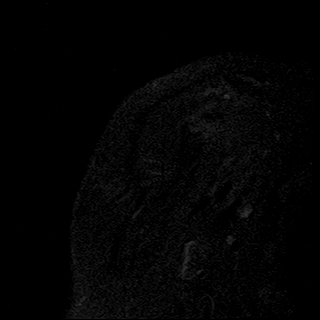
[im 5/18]
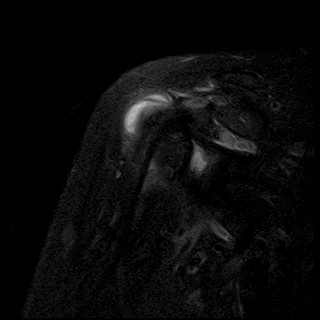
[im 9/18]
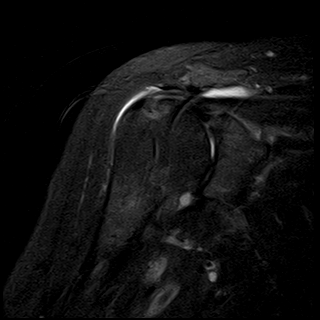
[im 13/18]
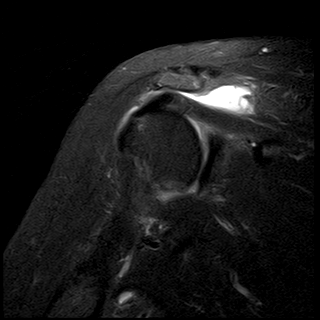
[im 18/18]
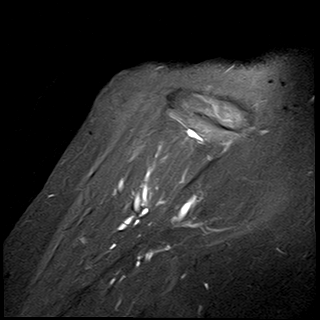

[Series 12: PD · oblique · right · 3.5mm · 0.47mm/px · 5 of 18 slices shown]
[im 1/18]
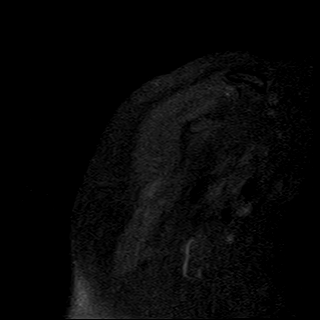
[im 5/18]
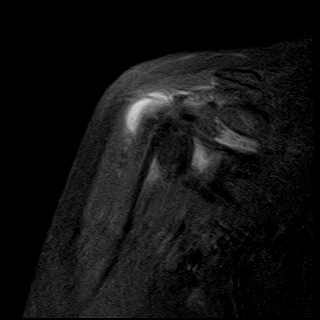
[im 9/18]
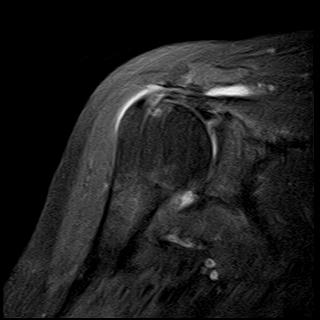
[im 13/18]
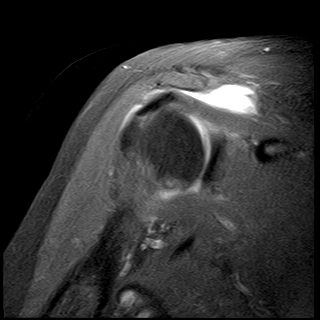
[im 18/18]
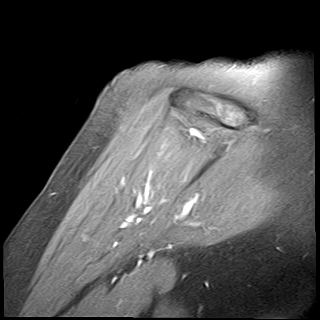

[40 of 40 positions shown; findings below may reference images not displayed]

FINDINGS: There is a moderate amount of fluid in the subacromial-subdeltoid bursa and a small amount of fluid in the glenohumeral joint.  

There is a full thickness tear of the distal supraspinatus tendon with marked reduction in the subacromial space.  There are moderate degenerative changes of the acromioclavicular joint impinging on the supraspinatus tendon.  

The remaining visualized tendons appear intact, although this examination is limited by motion artifact.  There is no fracture or dislocation.  The glenoid labrum appears to be intact.
IMPRESSION: Rotator cuff tear.

## 2022-07-27 ENCOUNTER — Inpatient Hospital Stay (HOSPITAL_COMMUNITY): Payer: Medicare Other

## 2022-07-27 ENCOUNTER — Encounter (HOSPITAL_BASED_OUTPATIENT_CLINIC_OR_DEPARTMENT_OTHER): Payer: Self-pay

## 2022-07-27 ENCOUNTER — Inpatient Hospital Stay (HOSPITAL_COMMUNITY): Payer: Medicare Other | Admitting: Family Medicine

## 2022-07-27 ENCOUNTER — Other Ambulatory Visit: Payer: Self-pay

## 2022-07-27 ENCOUNTER — Inpatient Hospital Stay
Admission: EM | Admit: 2022-07-27 | Discharge: 2022-07-29 | DRG: 871 | Disposition: A | Discharge status: Home / Self Care | Payer: Medicare Other | Attending: Student in an Organized Health Care Education/Training Program | Admitting: Student in an Organized Health Care Education/Training Program

## 2022-07-27 ENCOUNTER — Inpatient Hospital Stay (HOSPITAL_BASED_OUTPATIENT_CLINIC_OR_DEPARTMENT_OTHER): Payer: Medicare Other

## 2022-07-27 ENCOUNTER — Emergency Department (HOSPITAL_BASED_OUTPATIENT_CLINIC_OR_DEPARTMENT_OTHER): Payer: Medicare Other

## 2022-07-27 DIAGNOSIS — N179 Acute kidney failure, unspecified: Principal | ICD-10-CM | POA: Diagnosis present

## 2022-07-27 DIAGNOSIS — F319 Bipolar disorder, unspecified: Secondary | ICD-10-CM | POA: Diagnosis present

## 2022-07-27 DIAGNOSIS — R7989 Other specified abnormal findings of blood chemistry: Secondary | ICD-10-CM | POA: Diagnosis present

## 2022-07-27 DIAGNOSIS — E872 Acidosis, unspecified: Secondary | ICD-10-CM | POA: Diagnosis present

## 2022-07-27 DIAGNOSIS — R6520 Severe sepsis without septic shock: Secondary | ICD-10-CM | POA: Diagnosis present

## 2022-07-27 DIAGNOSIS — N390 Urinary tract infection, site not specified: Secondary | ICD-10-CM

## 2022-07-27 DIAGNOSIS — Z1152 Encounter for screening for COVID-19: Secondary | ICD-10-CM

## 2022-07-27 DIAGNOSIS — F419 Anxiety disorder, unspecified: Secondary | ICD-10-CM | POA: Insufficient documentation

## 2022-07-27 DIAGNOSIS — K529 Noninfective gastroenteritis and colitis, unspecified: Secondary | ICD-10-CM | POA: Insufficient documentation

## 2022-07-27 DIAGNOSIS — E869 Volume depletion, unspecified: Secondary | ICD-10-CM | POA: Diagnosis present

## 2022-07-27 DIAGNOSIS — M109 Gout, unspecified: Secondary | ICD-10-CM | POA: Diagnosis present

## 2022-07-27 DIAGNOSIS — I959 Hypotension, unspecified: Secondary | ICD-10-CM

## 2022-07-27 DIAGNOSIS — I2699 Other pulmonary embolism without acute cor pulmonale: Secondary | ICD-10-CM

## 2022-07-27 DIAGNOSIS — R0682 Tachypnea, not elsewhere classified: Secondary | ICD-10-CM

## 2022-07-27 DIAGNOSIS — A419 Sepsis, unspecified organism: Principal | ICD-10-CM | POA: Insufficient documentation

## 2022-07-27 DIAGNOSIS — Z8711 Personal history of peptic ulcer disease: Secondary | ICD-10-CM

## 2022-07-27 LAB — CBC WITH DIFF
BASOPHIL #: 0.01 10*3/uL (ref 0.00–0.30)
BASOPHIL %: 0 % (ref 0–3)
EOSINOPHIL #: 0.04 10*3/uL (ref 0.00–0.80)
EOSINOPHIL %: 0 % (ref 0–7)
HCT: 49.8 % — ABNORMAL HIGH (ref 37.0–47.0)
HGB: 16.6 g/dL — ABNORMAL HIGH (ref 12.5–16.0)
LYMPHOCYTE #: 2.01 10*3/uL (ref 1.10–5.00)
LYMPHOCYTE %: 20 % — ABNORMAL LOW (ref 25–45)
MCH: 31.5 pg (ref 27.0–32.0)
MCHC: 33.3 g/dL (ref 32.0–36.0)
MCV: 94.6 fL (ref 78.0–99.0)
MONOCYTE #: 1.59 10*3/uL — ABNORMAL HIGH (ref 0.00–1.30)
MONOCYTE %: 16 % — ABNORMAL HIGH (ref 0–12)
MPV: 6.8 fL — ABNORMAL LOW (ref 7.4–10.4)
NEUTROPHIL #: 6.47 10*3/uL (ref 1.80–8.40)
NEUTROPHIL %: 64 % (ref 40–76)
PLATELETS: 320 10*3/uL (ref 140–440)
RBC: 5.26 10*6/uL (ref 4.20–5.40)
RDW: 15.1 % — ABNORMAL HIGH (ref 11.6–14.8)
WBC: 10.1 10*3/uL (ref 4.0–10.5)

## 2022-07-27 LAB — URINALYSIS, MACRO/MICRO
BILIRUBIN: NEGATIVE mg/dL
GLUCOSE: 100 mg/dL — AB
NITRITE: NEGATIVE
PH: 5.5 (ref 4.6–8.0)
PROTEIN: 30 mg/dL — AB
SPECIFIC GRAVITY: 1.025 (ref 1.003–1.035)
UROBILINOGEN: 0.2 mg/dL (ref 0.2–1.0)

## 2022-07-27 LAB — TROPONIN-I
TROPONIN I: 4 ng/L (ref <15)
TROPONIN I: 4 ng/L (ref <15)

## 2022-07-27 LAB — COMPREHENSIVE METABOLIC PANEL, NON-FASTING
ALBUMIN/GLOBULIN RATIO: 0.9 (ref 0.8–1.4)
ALBUMIN: 4.5 g/dL (ref 3.4–5.0)
ALKALINE PHOSPHATASE: 89 U/L (ref 46–116)
ALT (SGPT): 12 U/L (ref <=78)
ANION GAP: 19 mmol/L — ABNORMAL HIGH (ref 4–13)
AST (SGOT): 25 U/L (ref 15–37)
BILIRUBIN TOTAL: 0.5 mg/dL (ref 0.2–1.0)
BUN/CREA RATIO: 12
BUN: 43 mg/dL — ABNORMAL HIGH (ref 7–18)
CALCIUM, CORRECTED: 9.3 mg/dL
CALCIUM: 9.7 mg/dL (ref 8.5–10.1)
CHLORIDE: 99 mmol/L (ref 98–107)
CO2 TOTAL: 21 mmol/L (ref 21–32)
CREATININE: 3.68 mg/dL — ABNORMAL HIGH (ref 0.55–1.02)
ESTIMATED GFR: 13 mL/min/{1.73_m2} — ABNORMAL LOW (ref >59)
GLOBULIN: 4.9
GLUCOSE: 137 mg/dL — ABNORMAL HIGH (ref 74–106)
OSMOLALITY, CALCULATED: 291 mOsm/kg — ABNORMAL HIGH (ref 270–290)
POTASSIUM: 4.4 mmol/L (ref 3.5–5.1)
PROTEIN TOTAL: 9.4 g/dL — ABNORMAL HIGH (ref 6.4–8.2)
SODIUM: 139 mmol/L (ref 136–145)

## 2022-07-27 LAB — URINALYSIS, MICROSCOPIC

## 2022-07-27 LAB — PTT (PARTIAL THROMBOPLASTIN TIME)
APTT: 83.6 seconds — ABNORMAL HIGH (ref 22.0–31.7)
APTT: 56.8 seconds — ABNORMAL HIGH (ref 25.0–38.0)

## 2022-07-27 LAB — COVID-19, FLU A/B, RSV RAPID BY PCR
INFLUENZA VIRUS TYPE A: NOT DETECTED
INFLUENZA VIRUS TYPE B: NOT DETECTED
RESPIRATORY SYNCTIAL VIRUS (RSV): NOT DETECTED
SARS-CoV-2: NOT DETECTED

## 2022-07-27 LAB — D-DIMER: D-DIMER: 1233 ng/mL DDU (ref <=232)

## 2022-07-27 LAB — MAGNESIUM: MAGNESIUM: 1.7 mg/dL — ABNORMAL LOW (ref 1.8–2.4)

## 2022-07-27 MED ORDER — LACTATED RINGERS INTRAVENOUS SOLUTION
INTRAVENOUS | Status: DC
Start: 2022-07-27 — End: 2022-07-27

## 2022-07-27 MED ORDER — LOPERAMIDE 2 MG CAPSULE
2.0000 mg | ORAL_CAPSULE | ORAL | Status: DC | PRN
Start: 2022-07-27 — End: 2022-07-29
  Administered 2022-07-27: 2 mg via ORAL
  Filled 2022-07-27: qty 1

## 2022-07-27 MED ORDER — HEPARIN (PORCINE) 25,000 UNIT/250 ML (100 UNIT/ML) IN DEXTROSE 5 % IV
18.0000 [IU]/kg/h | INTRAVENOUS | Status: DC
Start: 2022-07-27 — End: 2022-07-28
  Administered 2022-07-27: 17 [IU]/kg/h via INTRAVENOUS
  Administered 2022-07-27: 18 [IU]/kg/h via INTRAVENOUS
  Administered 2022-07-28: 0 [IU]/kg/h via INTRAVENOUS
  Filled 2022-07-27: qty 250

## 2022-07-27 MED ORDER — PANTOPRAZOLE 40 MG TABLET,DELAYED RELEASE
40.0000 mg | DELAYED_RELEASE_TABLET | Freq: Every day | ORAL | Status: DC
Start: 2022-07-28 — End: 2022-07-29
  Administered 2022-07-28 – 2022-07-29 (×2): 40 mg via ORAL
  Filled 2022-07-27 (×2): qty 1

## 2022-07-27 MED ORDER — DICYCLOMINE 10 MG CAPSULE
10.0000 mg | ORAL_CAPSULE | Freq: Four times a day (QID) | ORAL | Status: DC
Start: 2022-07-27 — End: 2022-07-29
  Administered 2022-07-27 – 2022-07-29 (×6): 10 mg via ORAL
  Filled 2022-07-27 (×6): qty 1

## 2022-07-27 MED ORDER — CLONAZEPAM 0.5 MG TABLET
0.5000 mg | ORAL_TABLET | Freq: Three times a day (TID) | ORAL | Status: DC
Start: 2022-07-27 — End: 2022-07-29
  Administered 2022-07-27 – 2022-07-29 (×5): 0.5 mg via ORAL
  Filled 2022-07-27 (×5): qty 1

## 2022-07-27 MED ORDER — LACTATED RINGERS IV BOLUS
500.0000 mL | INJECTION | Status: AC
Start: 2022-07-27 — End: 2022-07-27
  Administered 2022-07-27: 500 mL via INTRAVENOUS
  Administered 2022-07-27: 0 mL via INTRAVENOUS

## 2022-07-27 MED ORDER — ASPIRIN 81 MG CHEWABLE TABLET
CHEWABLE_TABLET | ORAL | Status: AC
Start: 2022-07-27 — End: 2022-07-27
  Filled 2022-07-27: qty 4

## 2022-07-27 MED ORDER — ETHYL ALCOHOL 62 % (NOZIN NASAL SANITIZER) NASAL SOLUTION - BULK BOTTLE
1.0000 | NASAL | Status: AC
Start: 2022-07-27 — End: 2022-07-27
  Administered 2022-07-27: 1 via NASAL
  Filled 2022-07-27: qty 1

## 2022-07-27 MED ORDER — MAGNESIUM SULFATE 1 GRAM/100 ML IN DEXTROSE 5 % INTRAVENOUS PIGGYBACK
1.0000 g | INJECTION | Freq: Once | INTRAVENOUS | Status: AC
Start: 2022-07-27 — End: 2022-07-27
  Administered 2022-07-27: 0 g via INTRAVENOUS
  Administered 2022-07-27: 1 g via INTRAVENOUS

## 2022-07-27 MED ORDER — SUCRALFATE 1 GRAM TABLET
ORAL_TABLET | ORAL | Status: AC
Start: 2022-07-27 — End: 2022-07-27
  Filled 2022-07-27: qty 1

## 2022-07-27 MED ORDER — SODIUM CHLORIDE 0.9 % INTRAVENOUS SOLUTION
INTRAVENOUS | Status: DC
Start: 2022-07-27 — End: 2022-07-28
  Administered 2022-07-28: 0 mL via INTRAVENOUS

## 2022-07-27 MED ORDER — CEFEPIME 2 GRAM SOLUTION FOR INJECTION
INTRAMUSCULAR | Status: AC
Start: 2022-07-27 — End: 2022-07-27
  Filled 2022-07-27: qty 12.5

## 2022-07-27 MED ORDER — HEPARIN (PORCINE) 5,000 UNITS/ML BOLUS
60.0000 [IU]/kg | Freq: Once | INTRAMUSCULAR | Status: AC
Start: 2022-07-27 — End: 2022-07-27
  Administered 2022-07-27: 4000 [IU] via INTRAVENOUS

## 2022-07-27 MED ORDER — SODIUM CHLORIDE 0.9 % INTRAVENOUS PIGGYBACK
INJECTION | INTRAVENOUS | Status: AC
Start: 2022-07-27 — End: 2022-07-27
  Filled 2022-07-27: qty 100

## 2022-07-27 MED ORDER — ARIPIPRAZOLE 15 MG TABLET
30.0000 mg | ORAL_TABLET | Freq: Every day | ORAL | Status: DC
Start: 2022-07-28 — End: 2022-07-29
  Administered 2022-07-28 – 2022-07-29 (×2): 30 mg via ORAL
  Filled 2022-07-27 (×2): qty 2

## 2022-07-27 MED ORDER — ONDANSETRON HCL (PF) 4 MG/2 ML INJECTION SOLUTION
4.0000 mg | INTRAMUSCULAR | Status: AC
Start: 2022-07-27 — End: 2022-07-27
  Administered 2022-07-27: 4 mg via INTRAVENOUS

## 2022-07-27 MED ORDER — SODIUM CHLORIDE 0.9 % INTRAVENOUS PIGGYBACK
2.0000 g | INTRAVENOUS | Status: AC
Start: 2022-07-27 — End: 2022-07-27
  Administered 2022-07-27: 0 g via INTRAVENOUS
  Administered 2022-07-27: 2 g via INTRAVENOUS

## 2022-07-27 MED ORDER — ETHYL ALCOHOL 62 % (NOZIN NASAL SANITIZER) NASAL SOLUTION - BULK BOTTLE
1.0000 | Freq: Two times a day (BID) | NASAL | Status: DC
Start: 2022-07-27 — End: 2022-07-29
  Administered 2022-07-27 – 2022-07-29 (×4): 1 via NASAL

## 2022-07-27 MED ORDER — ACETAMINOPHEN 325 MG TABLET
650.0000 mg | ORAL_TABLET | ORAL | Status: AC
Start: 2022-07-27 — End: 2022-07-27
  Administered 2022-07-27: 650 mg via ORAL

## 2022-07-27 MED ORDER — LEVOFLOXACIN 500 MG/100 ML IN 5 % DEXTROSE INTRAVENOUS PIGGYBACK
500.0000 mg | INJECTION | INTRAVENOUS | Status: AC
Start: 2022-07-27 — End: 2022-07-27
  Administered 2022-07-27: 0 mg via INTRAVENOUS
  Administered 2022-07-27: 500 mg via INTRAVENOUS
  Filled 2022-07-27: qty 100

## 2022-07-27 MED ORDER — ACETAMINOPHEN 325 MG TABLET
ORAL_TABLET | ORAL | Status: AC
Start: 2022-07-27 — End: 2022-07-27
  Filled 2022-07-27: qty 2

## 2022-07-27 MED ORDER — ONDANSETRON HCL (PF) 4 MG/2 ML INJECTION SOLUTION
INTRAMUSCULAR | Status: AC
Start: 2022-07-27 — End: 2022-07-27
  Filled 2022-07-27: qty 2

## 2022-07-27 MED ORDER — ASPIRIN 81 MG CHEWABLE TABLET
324.0000 mg | CHEWABLE_TABLET | ORAL | Status: AC
Start: 2022-07-27 — End: 2022-07-27
  Administered 2022-07-27: 324 mg via ORAL

## 2022-07-27 MED ORDER — HEPARIN (PORCINE) 25,000 UNIT/250 ML (100 UNIT/ML) IN DEXTROSE 5 % IV
INTRAVENOUS | Status: AC
Start: 2022-07-27 — End: 2022-07-27
  Filled 2022-07-27: qty 250

## 2022-07-27 MED ORDER — LACTATED RINGERS INTRAVENOUS SOLUTION
INTRAVENOUS | Status: AC
Start: 2022-07-27 — End: 2022-07-27

## 2022-07-27 MED ORDER — MAGNESIUM SULFATE 1 GRAM/100 ML IN DEXTROSE 5 % INTRAVENOUS PIGGYBACK
INJECTION | INTRAVENOUS | Status: AC
Start: 2022-07-27 — End: 2022-07-27
  Filled 2022-07-27: qty 100

## 2022-07-27 MED ORDER — SUCRALFATE 1 GRAM TABLET
1.0000 g | ORAL_TABLET | ORAL | Status: AC
Start: 2022-07-27 — End: 2022-07-27
  Administered 2022-07-27: 1 g via ORAL

## 2022-07-27 MED ORDER — LINEZOLID IN 5% DEXTROSE IN WATER 600 MG/300 ML INTRAVENOUS PIGGYBACK
INJECTION | INTRAVENOUS | Status: AC
Start: 2022-07-27 — End: 2022-07-27
  Filled 2022-07-27: qty 300

## 2022-07-27 MED ORDER — HEPARIN (PORCINE) 5,000 UNIT/ML INJECTION SOLUTION
INTRAMUSCULAR | Status: AC
Start: 2022-07-27 — End: 2022-07-27
  Filled 2022-07-27: qty 1

## 2022-07-27 NOTE — ED Nurses Note (Signed)
Patient had loose diarrhea stool in bedpan. Assisted in cleaning.

## 2022-07-27 NOTE — ED Provider Notes (Signed)
[facility], [facility] Emergency Department  ED Primary Provider Note  History of Present Illness   Chief Complaint   Patient presents with   . Chest Pain      Brooke Maxwell is a 72 y.o. female who had concerns including Chest Pain .  Arrival: The patient arrived by Car  {Dissapearing Help Tip  Use the HPI, Phys Exam, & MDM tabs at the top of the note composer to access the [employer] SmartBlocks for the respective sections as needed. As of Apr 02, 2021 you are only required to have a "medically appropriate" History, ROS, and PE for billing purposes:38172}  This 72 year old female patient presents emergency department with onset of chest pain intermittently over the last couple of days, presenting today with a heavy pressure sensation in the left chest and breast going into the shoulder and arm and back along with diaphoresis and dyspnea.  She was also had some nausea and vomiting with some loose stools over the last couple of days as well.  She denies palpitations, faster is low heartbeat, headache, nasal congestion, cough, wheeze, body aches or joint aches beyond the norm.  She denies any alcohol, marijuana, illicit drug use, denies ever smoking or vaping.  Patient has a past medical history significant for peptic ulcer disease, bipolar disease, depression, and arthropathy.  Past surgical history is significant for left breast biopsy, total abdominal hysterectomy, abdominal hernia repair, partial colectomy and right ankle surgery.    She was Moderna COVID vaccinated and has had all 4 boosters.  She was had COVID twice.      History Reviewed This Encounter:  Patient's past medical, surgical, social history reviewed noted.    Physical Exam   ED Triage Vitals [07/27/22 0828]   BP (Non-Invasive) (!) 102/42   Heart Rate (!) 116   Respiratory Rate (!) 30   Temperature (!) 35.7 C (96.3 F)   SpO2 99 %   Weight 79.4 kg (175 lb)   Height 1.6 m (5\' 3" )     Physical Exam  General:  Patient  is mildly anxious, tachypneic.  Oxygen saturations between 89 and 94% on room air  Neck: Supple, no JVD.  No carotid bruits, trachea is in midline.    Chest wall:  Nontender, symmetrical excursions   Lungs:  Clear symmetrical distant, fair aeration.    Heart: Regular rate rhythm normal S1-S2 without murmur or gallop   Abdomen:  Obese, soft, nontender with normal bowel sounds.  Extremities:  Moving symmetrically   Skin:  No suspicious rashes or lesions, no dependent edema.    Neurological:  No focal deficits.    Psychiatric:  Mildly anxious.        Patient Data   {Dissapearing Help Tip  Click here to open the ED Workup Activity for clinical data review:123}Labs Ordered/Reviewed - No data to display  No orders to display     Medical Decision Making   ED clinical impression missing, please click on the following link to add Clinical Impressions ***  then refresh the note prior to signing.  {Dissapearing Help Tip  Be sure to fill out the MDM SmartBlock in Notewriter to the [facility]  Medical Decision Making  High complexity due to the patient's presentation, labile blood pressure.  Evaluation for cardiac issues did note a 1200 D-dimer, 1st troponin is 4, and she has a greatly elevated BUN and creatinine over 3 months ago, which may very well co inside to when she started lisinopril  by her primary care provider.    Amount and/or Complexity of Data Reviewed  Labs: ordered.  Radiology: ordered.     Details: Chest x-ray reviewed and noted  ECG/medicine tests: ordered.     Details: EKG reviewed and noted    Risk  OTC drugs.  Prescription drug management.      ED Course as of 07/27/22 1127   Fri Jul 27, 2022   0846 EKG at 8:36 a.m., read at 8:39 a.m.: Sinus tach at 1:11 a.m. normal intervals leftward axis, LVH no STEMI   0947 Patient's labs reviewed, magnesium is 1.7 slightly low and will be replaced.  A great significance is her BUN creatinine is 43/3.68, three months ago prior to starting her lisinopril the BUN  creatinine were 15/1.18.  Patient's off CO2 is normal, first troponin I is 4.   1126 Labs reviewed and noted, urine noted we will need admitted.            Medications Administered in the ED   aspirin chewable tablet 324 mg (has no administration in time range)     ED clinical impression missing, please click on the following link to add Clinical Impressions ***  then refresh the note prior to signing.    Disposition: Data Unavailable  {Dissapearing Help Tip  Click Here to Open Level of Service Calculator:123}  {Critical Care Time (Optional):37527}   .Sondra Come, DO    {Dissapearing Help Tip  Remember to refresh your note prior to signing. Use Control + F11 or click the refresh button at the bottom of the note:38154}

## 2022-07-27 NOTE — ED Nurses Note (Signed)
Report called to [facility] on 3E. Patient being transported by [facility].

## 2022-07-27 NOTE — Care Plan (Signed)
Problem: Adult Inpatient Plan of Care  Goal: Plan of Care Review  07/27/2022 1749 by Pura Spice, RN  Outcome: Ongoing (see interventions/notes)  07/27/2022 1748 by Pura Spice, RN  Outcome: Ongoing (see interventions/notes)  Goal: Patient-Specific Goal (Individualized)  07/27/2022 1749 by Pura Spice, RN  Outcome: Ongoing (see interventions/notes)  07/27/2022 1748 by Pura Spice, RN  Outcome: Ongoing (see interventions/notes)  Goal: Absence of Hospital-Acquired Illness or Injury  07/27/2022 1749 by Pura Spice, RN  Outcome: Ongoing (see interventions/notes)  07/27/2022 1748 by Pura Spice, RN  Outcome: Ongoing (see interventions/notes)  Goal: Optimal Comfort and Wellbeing  07/27/2022 1749 by Pura Spice, RN  Outcome: Ongoing (see interventions/notes)  07/27/2022 1748 by Pura Spice, RN  Outcome: Ongoing (see interventions/notes)  Goal: Rounds/Family Conference  07/27/2022 1749 by Pura Spice, RN  Outcome: Ongoing (see interventions/notes)  07/27/2022 1748 by Pura Spice, RN  Outcome: Ongoing (see interventions/notes)     Problem: Fall Injury Risk  Goal: Absence of Fall and Fall-Related Injury  07/27/2022 1749 by Pura Spice, RN  Outcome: Ongoing (see interventions/notes)  07/27/2022 1748 by Pura Spice, RN  Outcome: Ongoing (see interventions/notes)     Problem: Gas Exchange Impaired  Goal: Optimal Gas Exchange  Outcome: Ongoing (see interventions/notes)

## 2022-07-27 NOTE — ED Nurses Note (Signed)
Patient requesting bedpan. Patient had moderate amount of liquid stool. Patient cleaned and repositioned in bed. Denies any needs.

## 2022-07-27 NOTE — ED Nurses Note (Signed)
Patient gagging. Reports nausea. Dr. Cristela Felt made aware and orders received. See [facility]

## 2022-07-27 NOTE — Consults (Signed)
[facility]   Nephrology Consult      Burdette, Gergely, 72 y.o. female  Encounter Start Date:  07/27/2022  Inpatient Admission Date:  07/27/2022  Date of Birth:  1951/04/02    Admitting Provider:  Hospitalist service    Reason for Consult:  Acute kidney injury    HPI:  This is a 72 year old female patient that I am doing Gout telehealth consult on her for acute kidney injury came in wife down nausea vomiting diarrhea creatinine 3.6 baseline creatinine 0.9 most likely prerenal azotemia hydrate the patient normal saline 150 mL/hour avoid nephrotoxic medications avoid NSAIDs check kidney ultrasound no indication for dialysis    Past Medical History:   Diagnosis Date    Arthropathy     Bipolar disorder, unspecified (CMS HCC)     Depression     PUD (peptic ulcer disease)     Wears dentures          Medications Prior to Admission       Prescriptions    ARIPiprazole (ABILIFY) 30 mg Oral Tablet    Take 1 Tablet (30 mg total) by mouth Once a day    dicyclomine (BENTYL) 10 mg Oral Capsule    Take 1 Capsule (10 mg total) by mouth Four times a day    ketorolac tromethamine (TORADOL) 10 mg Oral Tablet    Take 1 Tablet (10 mg total) by mouth Every 6 hours as needed for Pain    Patient not taking:  Reported on 07/27/2022    lisinopriL (PRINIVIL) 20 mg Oral Tablet    Take 1 Tablet (20 mg total) by mouth Once a day    ondansetron (ZOFRAN ODT) 4 mg Oral Tablet, Rapid Dissolve    Take 1 Tablet (4 mg total) by mouth Every 8 hours as needed for Nausea/Vomiting    pantoprazole (PROTONIX) 40 mg Oral Tablet, Delayed Release (E.C.)    Take 1 Tablet (40 mg total) by mouth Once a day    phenazopyridine (PYRIDIUM) 200 mg Oral Tablet    Take 1 Tablet (200 mg total) by mouth Three times a day    Patient not taking:  Reported on 07/27/2022    TraZODone (DESYREL) 300 mg Oral Tablet    Take 2 Tablets (600 mg total) by mouth Every night    Patient not taking:  Reported on 07/27/2022          alcohol 62 % (NOZIN NASAL SANITIZER) nasal  solution, 1 Each, Each Nostril, 2x/day  [START ON 07/28/2022] ARIPiprazole (ABILIFY) tablet, 30 mg, Oral, Daily  clonazePAM (klonoPIN) tablet, 0.5 mg, Oral, 3x/day  dicyclomine (BENTYL) capsule, 10 mg, Oral, 4x/day  heparin 25,000 units in D5W 250 mL infusion, 18 Units/kg/hr (Adjusted), Intravenous, Continuous  levoFLOXacin (LEVAQUIN) 500 mg in D5W 100 mL premix IVPB, 500 mg, Intravenous, Q24H  loperamide (IMODIUM) capsule, 2 mg, Oral, Q4H PRN  LR premix infusion, , Intravenous, Continuous  NS premix infusion, , Intravenous, Continuous  [START ON 07/28/2022] pantoprazole (PROTONIX) delayed release tablet, 40 mg, Oral, Daily      Allergies   Allergen Reactions    Cefaclor Anaphylaxis    Citalopram Mental Status Effect and Itching       ROS:   Not done  EXAM:  Filed Vitals:    07/27/22 1545 07/27/22 1724 07/27/22 1743 07/27/22 1944   BP: 119/78   (!) 101/56   Pulse: 91 (!) 103  91   Resp: (!) 28   20   Temp:  36.9 C (98.5 F)   SpO2: 96% 97% 98% 94%       Not done   Labs:         CBC Results Differential Results   Recent Labs     07/27/22  0843   WBC 10.1   HGB 16.6*   HCT 49.8*    Recent Labs     07/27/22  0843   PMNS 64   LYMPHOCYTES 20*   MONOCYTES 16*   EOSINOPHIL 0   BASOPHILS 0  0.01   PMNABS 6.47   LYMPHSABS 2.01   MONOSABS 1.59*   EOSABS 0.04      BMP Results Other Chemistries Results   Recent Labs     07/27/22  0843   SODIUM 139   POTASSIUM 4.4   CHLORIDE 99   CO2 21   BUN 43*   CREATININE 3.68*   GFR 13*   ANIONGAP 19*    Recent Labs     07/27/22  0843   CALCIUM 9.7   ALBUMIN 4.5   MAGNESIUM 1.7*      Liver/Pancreas Enzyme Results Blood Gas     Recent Labs     07/27/22  0843   TOTALPROTEIN 9.4*   ALBUMIN 4.5   AST 25   ALT 12   ALKPHOS 89    No results found for this encounter   Cardiac Results    Coags Results     TROPONIN I  Lab Results   Component Value Date    TROPONINI 4 07/27/2022    TROPONINI 4 07/27/2022         No results for input(s): "INR" in the last 72 hours.    Invalid input(s): "PTT", "PT"        Imaging Studies:    CT ABDOMEN PELVIS WO IV CONTRAST   Final Result   THE STOMACH SMALL BOWEL AND COLON ARE ALL DILATED AND FLUID-FILLED. MULTIPLE AIR-FLUID LEVELS ARE IDENTIFIED. ILEUS AND/OR ENTERITIS SUSPECTED. OBSTRUCTION IS IN THE DIFFERENTIAL.      NONOBSTRUCTING LEFT UROLITHIASIS.      THE LEFT HYPODENSE MASS IN THE ADRENAL GLAND HAS INCREASED SLIGHTLY IN SIZE VERSUS THE PREVIOUS EXAMINATION.      THERE IS ATELECTASIS OR AIRSPACE DISEASE IN THE LUNG BASES WITH ONE NODULAR DENSITY IN THE LEFT LUNG BASE NEW FROM THE PREVIOUS STUDY.       ONE ARTERY CALCIFICATIONS.      One or more dose reduction techniques were used (e.g., Automated exposure control, adjustment of the mA and/or kV according to patient size, use of iterative reconstruction technique).         Radiologist location ID: [id number]         XR AP MOBILE CHEST   Final Result   NO ACUTE FINDINGS.            Radiologist location ID: [id number]              Assessment/Plan:  Active Hospital Problems    Diagnosis    Primary Problem: Acute renal failure (ARF) (CMS HCC)    Sepsis (CMS HCC)    D-dimer, elevated    Colitis    Anxiety      Acute kidney injury most likely prerenal azotemia continue hydration avoid nephrotoxic medications no indication for dialysis check kidney ultrasound      Janyce Llanos, MD

## 2022-07-27 NOTE — ED Nurses Note (Signed)
Patient reports nausea is gone at this time.

## 2022-07-27 NOTE — H&P (Signed)
[place] [facility]     HOSPITALIST HISTORY & PHYSICAL    ASSESSMENT and PLAN     Active Hospital Problems    Diagnosis    Primary Problem: Acute renal failure (ARF) (CMS HCC)    Sepsis (CMS HCC)    D-dimer, elevated    Colitis    Anxiety       72 year old female presented [facility] with chest pain nausea vomiting diarrhea.      Acute renal failure, no history of chronic kidney disease   Nausea vomiting diarrhea, colitis   -admit inpatient   -stool studies, GI consult further recommendations   -nephrology consulted, likely depleted, started on normal saline 150 per hour, proceed with renal ultrasound, etiology could be related to nausea vomiting diarrhea as well as lisinopril and NSAID for arthropathy.  We will hold these.  -monitor potassium, repeat labs in the morning, no need for hemodialysis at this point.  -avoid nephrotoxic medications  -CT abdomen and pelvis concerning for colitis, and nonobstructing stones; on renal dose levaquin    Elevated D-dimer  -signs and symptoms are concerning for PE, we will get a V/Q scan, continue with heparin full dose  -systolic blood pressure has been no lower than 90, we will get echo rule out heart strain.    Urinary tract infection   -penicillin allergy, only thing patient reported tolerated in the past is Levaquin, GFR is 13, therefore we will give 1 time dose 500 and then repeat the dose in 48 hours if needed.    Diet: regular NPO after midnight  GI: home med  VTE: full dose hep  Code: full    Dispo: TBD    Phil Dopp, DO  July 27, 2022    I personally spent 70 minutes face-to-face and non-face-to-face in the care of this patient, which includes all pre, intra, and post visit time on the date of service.  All documented time was specific to the E/M visit and does not include any procedures that may have been performed.    This note was partially generated using MModal Fluency Direct system, and there may be some incorrect words, spellings,  and punctuation that were not noted in checking the note before saving.     See chart for further details on history including past medical, surgical, family, social.  See orders below regarding all other pertinent evaluation and management during hospital stay.   ----------------------------  ----------------------------  ----------------------------  MY ORDERS LAST 24 (24h ago, onward)       Start     Ordered    07/28/22 0900  ARIPiprazole (ABILIFY) tablet  DAILY         07/27/22 1915    07/28/22 0900  pantoprazole (PROTONIX) delayed release tablet  DAILY         07/27/22 1915    07/28/22 0600  TRANSTHORACIC ECHOCARDIOGRAM - ADULT  ONE TIME         07/27/22 1915    07/28/22 0530  CBC/DIFF  0530 - AM DRAW (labs only)         07/27/22 1915    07/28/22 0530  BASIC METABOLIC PANEL, NON-FASTING  0530 - AM DRAW (labs only)         07/27/22 1915    07/28/22 0530  C-REACTIVE PROTEIN(CRP),INFLAMMATION  0530 - AM DRAW (labs only)         07/27/22 1915    07/27/22 2200  PTT (PARTIAL THROMBOPLASTIN TIME)  ONE TIME  07/27/22 1814    07/27/22 2200  dicyclomine (BENTYL) capsule  4 TIMES DAILY         07/27/22 1915    07/27/22 2200  clonazePAM (klonoPIN) tablet  3 TIMES DAILY         07/27/22 1915    07/27/22 2100  alcohol 62 % (NOZIN NASAL SANITIZER) nasal solution  (MAINTAIN PREVIOUSLY INSERTED FOLEY CATHETER & NOZIN ORDER)  2 TIMES DAILY         07/27/22 1857    07/27/22 2100  NS premix infusion  CONTINUOUS         07/27/22 1912    07/27/22 2100  levoFLOXacin (LEVAQUIN) 500 mg in D5W 100 mL premix IVPB  EVERY 24 HOURS         07/27/22 1927    07/27/22 1930  IP CONSULT TO NEPHROLOGY Requested Provider; Janyce Llanos  ONE TIME        Provider:  (Not yet assigned)    07/27/22 1915    07/27/22 1915  FULL CODE  CONTINUOUS,   Status:  Canceled         07/27/22 1912    07/27/22 1915  VITAL SIGNS  Q4H  EVERY 4 HOURS         07/27/22 1915    07/27/22 1915  PULSE OXIMETRY Q4H  EVERY 4 HOURS         07/27/22 1915    07/27/22  1915  ACTIVITY Activity: AS TOLERATED; Instructions: WITH ASSIST  UNTIL DISCONTINUED         07/27/22 1915    07/27/22 1915  FULL CODE  CONTINUOUS         07/27/22 1915    07/27/22 1915  DIET REGULAR Do you want to initiate MNT Protocol? Yes  ALL MEALS         07/27/22 1915    07/27/22 1915  PT IS HIGH RISK FOR VENOUS THROMBOEMBOLISM  (HIGH RISK VTE LEVEL)  CONTINUOUS         07/27/22 1915    07/27/22 1915  VTE PROPHYLAXIS MEDICATION/MECHANICAL DEVICE NOT ORDERED AT THIS TIME PATIENT ON FULL DOSE ANTICOAGULATION  (HIGH RISK VTE LEVEL)  UNTIL DISCONTINUED         07/27/22 1915    07/27/22 1900  ENHANCED SAFETY CHECKS - EVERY HOUR  EVERY HOUR       07/27/22 1844    07/27/22 1900  GI PANEL BY BIOFIRE FILM ARRAY (RUBY)  ONE TIME         07/27/22 1856    07/27/22 1900  MAINTAIN PREVIOUSLY INSERTED FOLEY CATHETER  (MAINTAIN PREVIOUSLY INSERTED FOLEY CATHETER & NOZIN ORDER)  UNTIL DISCONTINUED         07/27/22 1857    07/27/22 1845  SUICIDE PRECAUTIONS LEVEL: MODERATE RISK PRECAUTIONS  UNTIL DISCONTINUED        Comments:       07/27/22 1844    07/27/22 1845  Consult - Suicide Assessment (IP Only)  ONE TIME        Provider:  (Not yet assigned)    07/27/22 1844    07/27/22 1245  PATIENT CLASS/LEVEL OF CARE DESIGNATION - PRN  ONE TIME        Comments: I certify hospital services are required for this patient based on their diagnosis, age, co-morbidities, and risk of adverse event if discharged.  All hospital services provided to the patient will be in accordance to 42 CFR 412.3.  Information regarding the patients diagnosis, testing,  and treatment may be found in the H&P and subsequent progress notes.      07/27/22 1231    Unscheduled  US KIDNEY  ONE TIME (IMAGING ONLY)         07/27/22 1912    Unscheduled  Notify MD Vital Signs  PRN       07/27/22 1915    Unscheduled  NUC VENTILATION AND PERFUSION SCAN  ONE TIME (IMAGING ONLY)         07/27/22 1915                    HISTORY:     Chief Complaint  SOB,  NVD    HPI:  72 year old female presented [facility] with chest pain nausea vomiting diarrhea.      History obtained from EHR review, patient at bedside ED provider.  Patient seen examined at bedside.    Prior to seeing the patient  -  Spoke with ED provider regarding patient, as does have acute renal failure likely from intravascular volume depletion as patient has had nausea vomiting diarrhea along with starting lisinopril and possibility of renal artery stenosis, therefore combination, however does also have elevated D-dimer, and this was to rule out PE, however can not get CTA due to creatinine function, does have a chest x-ray that was negative, ED provider heparinizing the patient, at this time systolic blood pressures more than 90, we will need to be evaluated with echo and V/Q scan once at [facility] to evaluate regarding possibility of PE.    Patient reported that she was having some shortness of breath along with chest pain heavy pressure, and nausea vomiting diarrhea no longer having as much diarrhea, denied history of blood clots in the past, also additional symptoms of dysuria, patient was found to have elevated creatinine at the emergency department, patient reported that she add dominant and diarrhea for the last 3 days as well as recently started on lisinopril, and Ativan taking diclofenac, which could have contributed to the elevated kidney function, creatinine 3.7, most recent creatinine 1.18 on 04/15/2022.    In the ED, blood pressure slightly soft, heart rate 90, afebrile, tachypneic, O2 sat 99%, pertinent labs tests as follows:  White count 10, hemoglobin 16, potassium 4.4, creatinine 3.7, previously 1.18, D-dimer 1200, troponin 4, 4, respiratory panel negative, urinalysis positive for urinary tract infection, chest x-ray negative, CT abdomen and pelvis concerning for colitis, and nonobstructing stones.  In the ED, ER provider started patient on heparin for suspected PE, requesting  admission.     Allergies:  Cefaclor and Citalopram    Medications:   Outpatient Medications Marked as Taking for the 07/27/22 encounter [facility] Encounter)   Medication Sig    ondansetron (ZOFRAN ODT) 4 mg Oral Tablet, Rapid Dissolve Take 1 Tablet (4 mg total) by mouth Every 8 hours as needed for Nausea/Vomiting        Medical History:  Past Medical History:   Diagnosis Date    Arthropathy     Bipolar disorder, unspecified (CMS HCC)     Depression     PUD (peptic ulcer disease)     Wears dentures            Surgical History:  Past Surgical History:   Procedure Laterality Date    ABDOMINAL HERNIA REPAIR      ABDOMINAL HYSTERECTOMY      ANKLE SURGERY Right     COLECTOMY PARTIAL / TOTAL      HX BREAST BIOPSY  Left     HX CARPAL TUNNEL RELEASE Bilateral     HX CYST REMOVAL      Cyst removed from neck    LITHOTRIPSY             Social History:  Social History     Socioeconomic History    Marital status: Divorced     Spouse name: Not on file    Number of children: Not on file    Years of education: Not on file    Highest education level: Not on file   Occupational History    Not on file   Tobacco Use    Smoking status: Never    Smokeless tobacco: Never   Vaping Use    Vaping status: Never Used   Substance and Sexual Activity    Alcohol use: Never    Drug use: Never    Sexual activity: Not on file   Other Topics Concern    Not on file   Social History Narrative    Not on file     Social Determinants of Health     Financial Resource Strain: Low Risk  (07/27/2022)    Financial Resource Strain     SDOH Financial: No   Transportation Needs: Low Risk  (07/27/2022)    Transportation Needs     SDOH Transportation: No   Social Connections: Low Risk  (07/27/2022)    Social Connections     SDOH Social Isolation: 5 or more times a week   Intimate Partner Violence: Not on file   Housing Stability: Low Risk  (07/27/2022)    Housing Stability     SDOH Housing Situation: I have housing.     SDOH Housing Worry: No     Tobacco use:   reports  that she has never smoked. She has never used smokeless tobacco.  Alcohol use:   reports no history of alcohol use.  Drug use:  reports no history of drug use.    Family History:  Family Medical History:    None           Review of Systems:  14 systems reviewed and are negative unless otherwise mentioned in HPI    PHYSICAL EXAM:     Filed Vitals:    07/27/22 1500 07/27/22 1545 07/27/22 1724 07/27/22 1743   BP: (!) 105/59 119/78     Pulse: 89 91 (!) 103    Resp: (!) 30 (!) 28     Temp:       SpO2: 95% 96% 97% 98%      Body mass index is 31 kg/m.     -GENERAL: No acute distress, Resting comfortably   -SKIN: warm and dry.  -CARDIAC: RRR, no MRGs  -LUNGS: Clear to auscultation bilaterally. No wheezes or rhonchi.  -GI: Abdomen soft, nontender.  -EXTREMITIES: No pedal edema noted. Distal pulses equal bilaterally.  -NEURO: Cranial nerves grossly intact. No gross focal deficit.     LABORATORY and RADIOLOGY DATA:   Labs:  Results for orders placed or performed during the hospital encounter of 07/27/22 (from the past 24 hour(s))   COMPREHENSIVE METABOLIC PANEL, NON-FASTING    Collection Time: 07/27/22  8:43 AM   Result Value Ref Range    SODIUM 139 136 - 145 mmol/L    POTASSIUM 4.4 3.5 - 5.1 mmol/L    CHLORIDE 99 98 - 107 mmol/L    CO2 TOTAL 21 21 - 32 mmol/L    ANION GAP 19 (H) 4 -  13 mmol/L    BUN 43 (H) 7 - 18 mg/dL    CREATININE 6.04 (H) 0.55 - 1.02 mg/dL    BUN/CREA RATIO 12     ESTIMATED GFR 13 (L) >59 mL/min/1.17m^2    ALBUMIN 4.5 3.4 - 5.0 g/dL    CALCIUM 9.7 8.5 - 54.0 mg/dL    GLUCOSE 981 (H) 74 - 106 mg/dL    ALKALINE PHOSPHATASE 89 46 - 116 U/L    ALT (SGPT) 12 <=78 U/L    AST (SGOT) 25 15 - 37 U/L    BILIRUBIN TOTAL 0.5 0.2 - 1.0 mg/dL    PROTEIN TOTAL 9.4 (H) 6.4 - 8.2 g/dL    ALBUMIN/GLOBULIN RATIO 0.9 0.8 - 1.4    OSMOLALITY, CALCULATED 291 (H) 270 - 290 mOsm/kg    CALCIUM, CORRECTED 9.3 mg/dL    GLOBULIN 4.9    TROPONIN-I NOW    Collection Time: 07/27/22  8:43 AM   Result Value Ref Range    TROPONIN I 4 <15  ng/L   D-DIMER    Collection Time: 07/27/22  8:43 AM   Result Value Ref Range    D-DIMER 1,233 (HH) <=232 ng/mL DDU   MAGNESIUM    Collection Time: 07/27/22  8:43 AM   Result Value Ref Range    MAGNESIUM 1.7 (L) 1.8 - 2.4 mg/dL   CBC WITH DIFF    Collection Time: 07/27/22  8:43 AM   Result Value Ref Range    WBC 10.1 4.0 - 10.5 x10^3/uL    RBC 5.26 4.20 - 5.40 x10^6/uL    HGB 16.6 (H) 12.5 - 16.0 g/dL    HCT 19.1 (H) 47.8 - 47.0 %    MCV 94.6 78.0 - 99.0 fL    MCH 31.5 27.0 - 32.0 pg    MCHC 33.3 32.0 - 36.0 g/dL    RDW 29.5 (H) 62.1 - 14.8 %    PLATELETS 320 140 - 440 x10^3/uL    MPV 6.8 (L) 7.4 - 10.4 fL    NEUTROPHIL % 64 40 - 76 %    LYMPHOCYTE % 20 (L) 25 - 45 %    MONOCYTE % 16 (H) 0 - 12 %    EOSINOPHIL % 0 0 - 7 %    BASOPHIL % 0 0 - 3 %    NEUTROPHIL # 6.47 1.80 - 8.40 x10^3/uL    LYMPHOCYTE # 2.01 1.10 - 5.00 x10^3/uL    MONOCYTE # 1.59 (H) 0.00 - 1.30 x10^3/uL    EOSINOPHIL # 0.04 0.00 - 0.80 x10^3/uL    BASOPHIL # 0.01 0.00 - 0.30 x10^3/uL   TROPONIN-I IN ONE HOUR    Collection Time: 07/27/22  9:34 AM   Result Value Ref Range    TROPONIN I 4 <15 ng/L   URINALYSIS, MACRO/MICRO    Collection Time: 07/27/22 10:02 AM   Result Value Ref Range    COLOR Yellow Yellow    APPEARANCE Cloudy (A) Clear    SPECIFIC GRAVITY 1.025 1.003 - 1.035    PH 5.5 4.6 - 8.0    LEUKOCYTES Large (A) Negative WBCs/uL    NITRITE Negative Negative    PROTEIN 30 (A) Negative mg/dL    GLUCOSE 308 (A) Negative mg/dL    KETONES Trace (A) Negative mg/dL    BILIRUBIN Negative Negative mg/dL    BLOOD Moderate (A) Negative mg/dL    UROBILINOGEN 0.2 0.2 - 1.0 mg/dL   URINALYSIS, MICROSCOPIC    Collection Time: 07/27/22 10:02 AM  Result Value Ref Range    RBCS 3-5 (A) 0-3, Not Present /hpf    BACTERIA Many (A) Negative /hpf    MUCOUS Few (A) (none) /hpf    CALCIUM OXALATE CRYSTALS Few (A) (none) /hpf    WBCS 6-10 (A) Not Present, Occasional, 0-5 /hpf    SQUAMOUS EPITHELIAL Several (A) Not Present, Few /hpf   COVID-19, FLU A/B, RSV RAPID BY  PCR    Collection Time: 07/27/22 10:38 AM   Result Value Ref Range    SARS-CoV-2 Not Detected Not Detected    INFLUENZA VIRUS TYPE A Not Detected Not Detected    INFLUENZA VIRUS TYPE B Not Detected Not Detected    RESPIRATORY SYNCTIAL VIRUS (RSV) Not Detected Not Detected   PTT (PARTIAL THROMBOPLASTIN TIME)    Collection Time: 07/27/22  4:01 PM   Result Value Ref Range    APTT 83.6 (H) 22.0 - 31.7 seconds       Imaging:  CT ABDOMEN PELVIS WO IV CONTRAST  Narrative: Wilhemina Cash Bahena    RADIOLOGISTMarkus Jarvis, MD    CT ABDOMEN PELVIS WO IV CONTRAST performed on 07/27/2022 2:27 PM    CLINICAL HISTORY: aneuric renal failure.  chest pain    TECHNIQUE:  Abdomen and pelvis CT without intravenous contrast.    COMPARISON:  04/15/2022    FINDINGS:  Noncontrast technique limits evaluation of the abdominal and pelvic viscera.    Lung bases: Pleural thickening in the interstitial changes with subsegmental atelectasis. Nodule in the left lung base lingula measuring 9.3 mm. This is new from the prior study    Liver:   Unremarkable.    Gallbladder:   Unremarkable.    Spleen:   Unremarkable.    Pancreas:   Unremarkable.    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    Bladder:  Foley catheter in place. Air within the urinary bladder lumen.    Uterus and Adnexa:  Prior hysterectomy.  Adnexal regions are unremarkable.    Bowel:   Dilated fluid-filled colon with air-fluid levels. Dilated fluid-filled loops of small bowel and stomach is dilated and fluid-filled.    Appendix:  The appendix is surgically absent.    Lymph nodes:  No suspicious lymph node enlargement.    Vasculature:   diffuse atherosclerotic calcifications are noted.     Peritoneum / Retroperitoneum: No ascites.  No free air.    Bones:   Degenerative changes of the spine.  Impression: THE STOMACH SMALL BOWEL AND COLON ARE ALL DILATED AND FLUID-FILLED. MULTIPLE  AIR-FLUID LEVELS ARE IDENTIFIED. ILEUS AND/OR ENTERITIS SUSPECTED. OBSTRUCTION IS IN THE DIFFERENTIAL.    NONOBSTRUCTING LEFT UROLITHIASIS.    THE LEFT HYPODENSE MASS IN THE ADRENAL GLAND HAS INCREASED SLIGHTLY IN SIZE VERSUS THE PREVIOUS EXAMINATION.    THERE IS ATELECTASIS OR AIRSPACE DISEASE IN THE LUNG BASES WITH ONE NODULAR DENSITY IN THE LEFT LUNG BASE NEW FROM THE PREVIOUS STUDY.     ONE ARTERY CALCIFICATIONS.    One or more dose reduction techniques were used (e.g., Automated exposure control, adjustment of the mA and/or kV according to patient size, use of iterative reconstruction technique).    Radiologist location ID: [id number]  XR AP MOBILE CHEST  Narrative: Wilhemina Cash Bokhari    RADIOLOGIST: Mickey Farber, MD    XR AP MOBILE CHEST performed on 07/27/2022 8:54 AM    CLINICAL HISTORY: Chest Pain.  sob. nausea. chest pain. nonsmoker    TECHNIQUE: Frontal view of the chest.    COMPARISON:  09/12/2019    FINDINGS:    There is borderline heart size   There are mildly increased right basilar markings with mild elevation of the right diaphragm. This appears to be largely due to suboptimal inspiration   Degenerative changes are identified within the thoracic spine.    Impression: NO ACUTE FINDINGS.    Radiologist location ID: [id number]        Micro:  No results found for any visits on 07/27/22 (from the past 24 hour(s)).

## 2022-07-27 NOTE — ED Triage Notes (Signed)
Pt complains of midsternal chest pain x 2 days radiating to the back, also complains of vomiting and diarrhea x 3 days, also shortness of breath.

## 2022-07-28 ENCOUNTER — Inpatient Hospital Stay (HOSPITAL_COMMUNITY): Payer: Medicare Other

## 2022-07-28 DIAGNOSIS — E872 Acidosis, unspecified: Secondary | ICD-10-CM

## 2022-07-28 DIAGNOSIS — Z79899 Other long term (current) drug therapy: Secondary | ICD-10-CM

## 2022-07-28 DIAGNOSIS — R000 Tachycardia, unspecified: Secondary | ICD-10-CM

## 2022-07-28 DIAGNOSIS — R9431 Abnormal electrocardiogram [ECG] [EKG]: Secondary | ICD-10-CM

## 2022-07-28 LAB — CBC WITH DIFF
BASOPHIL #: 0 10*3/uL (ref 0.00–0.10)
BASOPHIL %: 0 % (ref 0–1)
EOSINOPHIL #: 0.3 10*3/uL (ref 0.00–0.50)
EOSINOPHIL %: 4 %
HCT: 37.6 % (ref 31.2–41.9)
HGB: 12.9 g/dL (ref 10.9–14.3)
LYMPHOCYTE #: 1.7 10*3/uL (ref 1.00–3.00)
LYMPHOCYTE %: 19 % (ref 16–44)
MCH: 31.4 pg (ref 24.7–32.8)
MCHC: 34.2 g/dL (ref 32.3–35.6)
MCV: 92 fL (ref 75.5–95.3)
MONOCYTE #: 1.8 10*3/uL — ABNORMAL HIGH (ref 0.30–1.00)
MONOCYTE %: 19 % — ABNORMAL HIGH (ref 5–13)
MPV: 7 fL — ABNORMAL LOW (ref 7.9–10.8)
NEUTROPHIL #: 5.4 10*3/uL (ref 1.85–7.80)
NEUTROPHIL %: 59 % (ref 43–77)
PLATELETS: 273 10*3/uL (ref 140–440)
RBC: 4.09 10*6/uL (ref 3.63–4.92)
RDW: 14.1 % (ref 12.3–17.7)
WBC: 9.3 10*3/uL (ref 3.8–11.8)

## 2022-07-28 LAB — BASIC METABOLIC PANEL
ANION GAP: 15 mmol/L — ABNORMAL HIGH (ref 4–13)
BUN/CREA RATIO: 13 (ref 6–22)
BUN: 58 mg/dL — ABNORMAL HIGH (ref 7–25)
CALCIUM: 8 mg/dL — ABNORMAL LOW (ref 8.6–10.3)
CHLORIDE: 103 mmol/L (ref 98–107)
CO2 TOTAL: 16 mmol/L — ABNORMAL LOW (ref 21–31)
CREATININE: 4.61 mg/dL — ABNORMAL HIGH (ref 0.60–1.30)
ESTIMATED GFR: 10 mL/min/{1.73_m2} — ABNORMAL LOW (ref >59)
GLUCOSE: 87 mg/dL (ref 74–109)
OSMOLALITY, CALCULATED: 284 mOsm/kg (ref 270–290)
POTASSIUM: 4.8 mmol/L (ref 3.5–5.1)
SODIUM: 134 mmol/L — ABNORMAL LOW (ref 136–145)

## 2022-07-28 LAB — GI PANEL BY BIOFIRE FILM ARRAY
ADENOVIRUS F 40/41: NOT DETECTED
ASTROVIRUS: NOT DETECTED
CAMPYLOBACTER: NOT DETECTED
CRYPTOSPORIDIUM: NOT DETECTED
CYCLOSPORA CAYETANENSIS: NOT DETECTED
ENTAMOEBA HISTOLYTICA: NOT DETECTED
ENTEROAGGREGATIVE E. COLI (EAEC): NOT DETECTED
ENTEROPATHOGENIC E COLI (EPEC): NOT DETECTED
ENTEROTOXIGENIC E COLI (ETEC) LT/ST: NOT DETECTED
GIARDIA LAMBLIA: NOT DETECTED
NOROVIRUS GI/GII: NOT DETECTED
PLESIOMONAS SHIGELLOIDES: NOT DETECTED
ROTAVIRUS A: NOT DETECTED
SALMONELLA SPECIES: NOT DETECTED
SAPOVIRUS: NOT DETECTED
SHIGA-LIKE TOXIN-PRODUCING E COLI (STEC) STX1/STX2: NOT DETECTED
SHIGELLA/ENTEROINVASIVE E COLI (EIEC): NOT DETECTED
VIBRIO CHOLERAE: NOT DETECTED
VIBRIO: NOT DETECTED
YERSINIA ENTEROCOLITICA: NOT DETECTED

## 2022-07-28 LAB — ECG 12 LEAD
Atrial Rate: 111 {beats}/min
Calculated P Axis: 18 degrees
Calculated R Axis: -30 degrees
Calculated T Axis: 41 degrees
PR Interval: 132 ms
QRS Duration: 78 ms
QT Interval: 336 ms
QTC Calculation: 456 ms
Ventricular rate: 111 {beats}/min

## 2022-07-28 LAB — HEPATITIS B SURFACE ANTIBODY: HBV SURFACE ANTIBODY QUANTITATIVE: 0 m[IU]/mL (ref <8)

## 2022-07-28 LAB — HEPATITIS B SURFACE ANTIGEN: HBV SURFACE ANTIGEN QUALITATIVE: NEGATIVE

## 2022-07-28 LAB — PTT (PARTIAL THROMBOPLASTIN TIME)
APTT: 48.3 seconds — ABNORMAL HIGH (ref 25.0–38.0)
APTT: 43 seconds — ABNORMAL HIGH (ref 25.0–38.0)

## 2022-07-28 LAB — C-REACTIVE PROTEIN(CRP),INFLAMMATION: C-REACTIVE PROTEIN (CRP): 22.7 mg/dL — ABNORMAL HIGH (ref 0.1–0.5)

## 2022-07-28 LAB — B-TYPE NATRIURETIC PEPTIDE (BNP),PLASMA: BNP: <25 pg/mL (ref 5–100)

## 2022-07-28 MED ORDER — SODIUM CHLORIDE 0.9 % IV BOLUS
500.0000 mL | INJECTION | Status: AC
Start: 2022-07-28 — End: 2022-07-28
  Administered 2022-07-28: 500 mL via INTRAVENOUS
  Administered 2022-07-28: 0 mL via INTRAVENOUS

## 2022-07-28 MED ORDER — HEPARIN (PORCINE) 5,000 UNIT/ML INJECTION SOLUTION
5000.0000 [IU] | Freq: Three times a day (TID) | INTRAMUSCULAR | Status: DC
Start: 2022-07-28 — End: 2022-07-29
  Administered 2022-07-28 – 2022-07-29 (×3): 5000 [IU] via SUBCUTANEOUS
  Filled 2022-07-28 (×3): qty 1

## 2022-07-28 MED ORDER — LEVOFLOXACIN 750 MG / 150 ML IVPB
340.0000 mg | INJECTION | INTRAVENOUS | Status: DC
Start: 2022-07-28 — End: 2022-07-29
  Administered 2022-07-28: 0 mg via INTRAVENOUS
  Administered 2022-07-28: 340 mg via INTRAVENOUS
  Filled 2022-07-28 (×3): qty 68

## 2022-07-28 MED ORDER — SODIUM BICARBONATE 1 MEQ/ML (8.4 %) INTRAVENOUS SOLUTION
INTRAVENOUS | Status: AC
Start: 2022-07-28 — End: ?
  Filled 2022-07-28 (×8): qty 100

## 2022-07-28 NOTE — Consults (Signed)
[facility]   Nephrology Consult      Brooke Maxwell, Brooke Maxwell, 72 y.o. female  Encounter Start Date:  07/27/2022  Inpatient Admission Date:  07/27/2022  Date of Birth:  Jun 12, 1950    Admitting Provider:  Hospitalist service    Reason for Consult:  Acute kidney injury    HPI:  This is a 72 year old female patient that I am doing telehealth consult on her for acute kidney injury came in wife down nausea vomiting diarrhea creatinine 3.6 baseline creatinine 0.9 most likely prerenal azotemia hydrate the patient normal saline 150 mL/hour avoid nephrotoxic medications avoid NSAIDs check kidney ultrasound no indication for dialysis   07-28-2022  Nephrology progress note on this patient with acute kidney injury kidney function is worsening more acidotic will start the patient on sodium bicarb drip 150 mL/hour no indication for dialysis continue hydration    Past Medical History:   Diagnosis Date    Arthropathy     Bipolar disorder, unspecified (CMS HCC)     Depression     PUD (peptic ulcer disease)     Wears dentures          Medications Prior to Admission       Prescriptions    ARIPiprazole (ABILIFY) 30 mg Oral Tablet    Take 1 Tablet (30 mg total) by mouth Once a day    dicyclomine (BENTYL) 10 mg Oral Capsule    Take 1 Capsule (10 mg total) by mouth Four times a day    ketorolac tromethamine (TORADOL) 10 mg Oral Tablet    Take 1 Tablet (10 mg total) by mouth Every 6 hours as needed for Pain    Patient not taking:  Reported on 07/27/2022    lisinopriL (PRINIVIL) 20 mg Oral Tablet    Take 1 Tablet (20 mg total) by mouth Once a day    ondansetron (ZOFRAN ODT) 4 mg Oral Tablet, Rapid Dissolve    Take 1 Tablet (4 mg total) by mouth Every 8 hours as needed for Nausea/Vomiting    pantoprazole (PROTONIX) 40 mg Oral Tablet, Delayed Release (E.C.)    Take 1 Tablet (40 mg total) by mouth Once a day    phenazopyridine (PYRIDIUM) 200 mg Oral Tablet    Take 1 Tablet (200 mg total) by mouth Three times a day    Patient not taking:   Reported on 07/27/2022    TraZODone (DESYREL) 300 mg Oral Tablet    Take 2 Tablets (600 mg total) by mouth Every night    Patient not taking:  Reported on 07/27/2022          1/2 NS 1,000 mL with sodium bicarbonate 100 mEq infusion, , Intravenous, Continuous  alcohol 62 % (NOZIN NASAL SANITIZER) nasal solution, 1 Each, Each Nostril, 2x/day  ARIPiprazole (ABILIFY) tablet, 30 mg, Oral, Daily  clonazePAM (klonoPIN) tablet, 0.5 mg, Oral, 3x/day  dicyclomine (BENTYL) capsule, 10 mg, Oral, 4x/day  heparin 5,000 unit/mL injection, 5,000 Units, Subcutaneous, Q8HRS  levoFLOXacin (LEVAQUIN) 750 mg in D5W 150 mL premix IVPB, 340 mg, Intravenous, Q24H  loperamide (IMODIUM) capsule, 2 mg, Oral, Q4H PRN  pantoprazole (PROTONIX) delayed release tablet, 40 mg, Oral, Daily      Allergies   Allergen Reactions    Cefaclor Anaphylaxis    Citalopram Mental Status Effect and Itching       ROS:   Not done    EXAM:  Filed Vitals:    07/28/22 1042 07/28/22 1723 07/28/22 1930 07/28/22 2100   BP:  109/71 124/76    Pulse: 78 94 80    Resp:  (!) 21 (!) 24    Temp:  36.9 C (98.4 F) 37.3 C (99.1 F)    SpO2:  96% 96% 97%       Not done   Labs:         CBC Results Differential Results   Recent Labs     07/27/22  0843 07/28/22  0433   WBC 10.1 9.3   HGB 16.6* 12.9   HCT 49.8* 37.6    Recent Labs     07/27/22  0843 07/28/22  0433   PMNS 64 59   LYMPHOCYTES 20* 19   MONOCYTES 16* 19*   EOSINOPHIL 0 4   BASOPHILS 0  0.01 0  0.00   PMNABS 6.47 5.40   LYMPHSABS 2.01 1.70   MONOSABS 1.59* 1.80*   EOSABS 0.04 0.30      BMP Results Other Chemistries Results   Recent Labs     07/27/22  0843 07/28/22  0433   SODIUM 139 134*   POTASSIUM 4.4 4.8   CHLORIDE 99 103   CO2 21 16*   BUN 43* 58*   CREATININE 3.68* 4.61*   GFR 13* 10*   ANIONGAP 19* 15*    Recent Labs     07/27/22  0843 07/28/22  0433   CALCIUM 9.7 8.0*   ALBUMIN 4.5  --    MAGNESIUM 1.7*  --       Liver/Pancreas Enzyme Results Blood Gas     Recent Labs     07/27/22  0843   TOTALPROTEIN 9.4*    ALBUMIN 4.5   AST 25   ALT 12   ALKPHOS 89    No results found for this encounter   Cardiac Results    Coags Results     TROPONIN I  Lab Results   Component Value Date    TROPONINI 4 07/27/2022    TROPONINI 4 07/27/2022         No results for input(s): "INR" in the last 72 hours.    Invalid input(s): "PTT", "PT"       Imaging Studies:    NUC VENTILATION AND PERFUSION SCAN   Final Result   NORMAL VQ SCAN.         Radiologist location ID: [id number]         US KIDNEY   Final Result   Nonobstructing left nephrolithiasis      Normal right kidney         Radiologist location ID: [id number]         CT ABDOMEN PELVIS WO IV CONTRAST   Final Result   THE STOMACH SMALL BOWEL AND COLON ARE ALL DILATED AND FLUID-FILLED. MULTIPLE AIR-FLUID LEVELS ARE IDENTIFIED. ILEUS AND/OR ENTERITIS SUSPECTED. OBSTRUCTION IS IN THE DIFFERENTIAL.      NONOBSTRUCTING LEFT UROLITHIASIS.      THE LEFT HYPODENSE MASS IN THE ADRENAL GLAND HAS INCREASED SLIGHTLY IN SIZE VERSUS THE PREVIOUS EXAMINATION.      THERE IS ATELECTASIS OR AIRSPACE DISEASE IN THE LUNG BASES WITH ONE NODULAR DENSITY IN THE LEFT LUNG BASE NEW FROM THE PREVIOUS STUDY.       ONE ARTERY CALCIFICATIONS.      One or more dose reduction techniques were used (e.g., Automated exposure control, adjustment of the mA and/or kV according to patient size, use of iterative reconstruction technique).         Radiologist location ID: [id number]  XR AP MOBILE CHEST   Final Result   NO ACUTE FINDINGS.            Radiologist location ID: [id number]              Assessment/Plan:  Active Hospital Problems    Diagnosis    Primary Problem: Acute renal failure (ARF) (CMS HCC)    Sepsis (CMS HCC)    D-dimer, elevated    Colitis    Anxiety    Hypotension, unspecified hypotension type    Pulmonary embolism, unspecified chronicity, unspecified pulmonary embolism type, unspecified whether acute cor pulmonale present (CMS HCC)      Acute kidney injury worsening kidney function with  metabolic acidosis start sodium bicarb drip 150 mL/hour no indication for dialysis check kidney ultrasound      Janyce Llanos, MD

## 2022-07-28 NOTE — Progress Notes (Addendum)
[place] [facility]    HOSPITALIST PROGRESS NOTE    Brooke Maxwell  Date of service: 07/28/2022  Date of Admission:  07/27/2022  Hospital Day:  LOS: 1 day     Subjective:   Patient seen and examined at bedside.  Admitted with sepsis secondary to urinary tract infection vs. Colitis and AKI.  States diarrhea has slowed down but persists.  No abdominal pain.  No shortness of breath or cough.  No fevers overnight.  Blood pressure remains marginal.  Creatinine bumped up to 4.  No other complaints at present no overnight events.  V/Q scan normal.    A focused review of symptoms was performed and is negative unless specified in HPI/subjective.    Vital Signs:  Filed Vitals:    07/28/22 0419 07/28/22 0500 07/28/22 0715 07/28/22 1042   BP: (!) 82/54 99/60 99/61     Pulse:   76 78   Resp:       Temp:   36.6 C (97.8 F)    SpO2:   94%         Physical Exam:  GENERAL: Awake, alert.  NAD.   EYES: EOMI.  Sclera anicteric.  No conjunctival injection.   HENT:  Atraumatic, normocephalic.  Neck supple, trachea midline.   PULM:  Non-labored.  CTA bilaterally.   CV:  RRR by auscultation.  S1, S2 present.   GI:  Abdomen soft, non-tender.  Bowel sounds present.   SKIN: Warm, dry and pink.   EXTREMITIES:  Pedal pulses present bilaterally.  No pedal edema.     Current Medications:  1/2 NS 1,000 mL with sodium bicarbonate 100 mEq infusion, , Intravenous, Continuous  alcohol 62 % (NOZIN NASAL SANITIZER) nasal solution, 1 Each, Each Nostril, 2x/day  ARIPiprazole (ABILIFY) tablet, 30 mg, Oral, Daily  clonazePAM (klonoPIN) tablet, 0.5 mg, Oral, 3x/day  dicyclomine (BENTYL) capsule, 10 mg, Oral, 4x/day  loperamide (IMODIUM) capsule, 2 mg, Oral, Q4H PRN  pantoprazole (PROTONIX) delayed release tablet, 40 mg, Oral, Daily        Labs:  CBC:     9.3 (04/27 0433) \   12.9 (04/27 1610) /   273 (04/27 9604)      / 37.6 (04/27 5409) \          BMP:   134* (04/27 8119) 103 (04/27 1478) 58* (04/27 2956)    /     87 (04/27 2130)   4.8  (04/27 0433) 16* (04/27 0433) 4.61* (04/27 0433) \              No results found for this or any previous visit (from the past 24 hour(s)).       Radiology:  XR AP MOBILE CHEST    Result Date: 07/27/2022  Impression NO ACUTE FINDINGS. Radiologist location ID: [id number]     The laboratory results, imaging results and other diagnostic results were reviewed in the EMR.    Assessment/ Plan:   Active Hospital Problems   (*Primary Problem)    Diagnosis    *Acute renal failure (ARF) (CMS HCC)    Sepsis (CMS HCC)    D-dimer, elevated    Colitis    Anxiety    Hypotension, unspecified hypotension type    Pulmonary embolism, unspecified chronicity, unspecified pulmonary embolism type, unspecified whether acute cor pulmonale present (CMS HCC)     Severe sepsis with hypotension secondary to gastrointestinal vs.  urinary tract infection.  Continues to have marginal blood pressures, continue to monitor  with q.4 hours vital checks.  Continue IV fluid hydration and empiric levofloxacin.  Urine and blood cultures pending.  We will check stool studies including C difficile PCR.    Acute renal failure complicated by metabolic acidosis.  Continued renal decline, creatinine up to 4.61.  Nephrology following and adjusting IV fluids, appreciate recommendations.  Continue to monitor with daily BMP.    Metabolic acidosis:  Likely as a consequence subacute renal insufficiency with component of GI loss.  Continue IV sodium bicarbonate infusion.    Colitis:  Concern for possible C difficile infection given recent antibiotic exposure.  We will check C diff PCR.    Suspected pulmonary embolism:  This has been ruled out.  V/Q scan is normal.  We will discontinue heparin infusion.      VTE Prophylaxis: Heparin  Disposition Planning: Home discharge      On the day of the encounter, a total of  35 minutes was spent on this patient encounter including review of historical information, examination, documentation and post-visit activities. The time  documented excludes procedural time.     Venida Jarvis, DO   07/28/2022  [place] [facility] HOSPITALIST

## 2022-07-29 ENCOUNTER — Other Ambulatory Visit: Payer: Self-pay

## 2022-07-29 ENCOUNTER — Inpatient Hospital Stay (HOSPITAL_COMMUNITY): Payer: Medicare Other

## 2022-07-29 DIAGNOSIS — I2699 Other pulmonary embolism without acute cor pulmonale: Secondary | ICD-10-CM

## 2022-07-29 DIAGNOSIS — Z881 Allergy status to other antibiotic agents status: Secondary | ICD-10-CM

## 2022-07-29 DIAGNOSIS — F319 Bipolar disorder, unspecified: Secondary | ICD-10-CM

## 2022-07-29 LAB — BASIC METABOLIC PANEL
ANION GAP: 5 mmol/L (ref 4–13)
BUN/CREA RATIO: 27 — ABNORMAL HIGH (ref 6–22)
BUN: 33 mg/dL — ABNORMAL HIGH (ref 7–25)
CALCIUM: 8.1 mg/dL — ABNORMAL LOW (ref 8.6–10.3)
CHLORIDE: 109 mmol/L — ABNORMAL HIGH (ref 98–107)
CO2 TOTAL: 22 mmol/L (ref 21–31)
CREATININE: 1.21 mg/dL (ref 0.60–1.30)
ESTIMATED GFR: 48 mL/min/{1.73_m2} — ABNORMAL LOW (ref >59)
GLUCOSE: 83 mg/dL (ref 74–109)
OSMOLALITY, CALCULATED: 278 mOsm/kg (ref 270–290)
POTASSIUM: 4.1 mmol/L (ref 3.5–5.1)
SODIUM: 136 mmol/L (ref 136–145)

## 2022-07-29 LAB — CBC
HCT: 32.2 % (ref 31.2–41.9)
HGB: 11.3 g/dL (ref 10.9–14.3)
MCH: 32.1 pg (ref 24.7–32.8)
MCHC: 35 g/dL (ref 32.3–35.6)
MCV: 91.6 fL (ref 75.5–95.3)
MPV: 6.7 fL — ABNORMAL LOW (ref 7.9–10.8)
PLATELETS: 238 10*3/uL (ref 140–440)
RBC: 3.52 10*6/uL — ABNORMAL LOW (ref 3.63–4.92)
RDW: 14.1 % (ref 12.3–17.7)
WBC: 5.4 10*3/uL (ref 3.8–11.8)

## 2022-07-29 LAB — URINE CULTURE,ROUTINE: URINE CULTURE: 10000 — AB

## 2022-07-29 MED ORDER — LEVOFLOXACIN 500 MG TABLET
500.0000 mg | ORAL_TABLET | Freq: Every day | ORAL | 0 refills | Status: AC
Start: 2022-07-29 — End: 2022-08-02

## 2022-07-29 NOTE — Care Plan (Signed)
Pt resting in bed with eyes closed, respirations WDL, VSS, 2L NC at this time. Call bell in reach. Plan of care ongoing.     Problem: Adult Inpatient Plan of Care  Goal: Plan of Care Review  Outcome: Ongoing (see interventions/notes)  Goal: Patient-Specific Goal (Individualized)  Outcome: Ongoing (see interventions/notes)  Goal: Absence of Hospital-Acquired Illness or Injury  Outcome: Ongoing (see interventions/notes)  Goal: Optimal Comfort and Wellbeing  Outcome: Ongoing (see interventions/notes)  Goal: Rounds/Family Conference  Outcome: Ongoing (see interventions/notes)     Problem: Fall Injury Risk  Goal: Absence of Fall and Fall-Related Injury  Outcome: Ongoing (see interventions/notes)     Problem: Gas Exchange Impaired  Goal: Optimal Gas Exchange  Outcome: Ongoing (see interventions/notes)     Problem: Health Knowledge, Opportunity to Enhance (Adult,Obstetrics,Pediatric)  Goal: Knowledgeable about Health Subject/Topic  Description: Patient will demonstrate the desired outcomes by discharge/transition of care.  Outcome: Ongoing (see interventions/notes)

## 2022-07-29 NOTE — Discharge Summary (Signed)
[facility]  DISCHARGE SUMMARY    PATIENT NAME:  Brooke Maxwell, Brooke Maxwell  MRN:  [id number]  DOB:  12/20/1950    ENCOUNTER DATE:  07/27/2022  INPATIENT ADMISSION DATE: 07/27/2022  DISCHARGE DATE:  07/29/2022    ATTENDING PHYSICIAN: Venida Jarvis, DO  SERVICE: PRN HOSPITALIST 1  PRIMARY CARE PHYSICIAN: Phil Dopp, DO       No lay caregiver identified.    PRIMARY DISCHARGE DIAGNOSIS: Acute renal failure (ARF) (CMS [facility])  Active Hospital Problems    Diagnosis Date Noted    Principal Problem: Acute renal failure (ARF) (CMS HCC) [N17.9] 07/27/2022    Sepsis (CMS HCC) [A41.9] 07/27/2022    D-dimer, elevated [R79.89] 07/27/2022    Colitis [K52.9] 07/27/2022    Anxiety [F41.9] 07/27/2022    Hypotension, unspecified hypotension type [I95.9] 07/27/2022    Pulmonary embolism, unspecified chronicity, unspecified pulmonary embolism type, unspecified whether acute cor pulmonale present (CMS [facility]) [I26.99] 07/27/2022      Resolved Hospital Problems   No resolved problems to display.     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        Details   levoFLOXacin 500 mg Tablet  Commonly known as: LEVAQUIN   500 mg, Oral, DAILY  Qty: 4 Tablet  Refills: 0            CONTINUE these medications - NO CHANGES were made during your visit.        Details   ARIPiprazole 30 mg Tablet  Commonly known as: ABILIFY   1 Tablet, Oral, DAILY  Refills: 0     dicyclomine 10 mg Capsule  Commonly known as: BENTYL   10 mg, Oral, 4 TIMES DAILY  Qty: 40 Capsule  Refills: 0     lisinopriL 20 mg Tablet  Commonly known as: PRINIVIL   20 mg, Oral, DAILY  Qty: 30 Tablet  Refills: 0     ondansetron 4 mg Tablet, Rapid Dissolve  Commonly  known as: ZOFRAN ODT   4 mg, Oral, EVERY 8 HOURS PRN  Qty: 12 Tablet  Refills: 0     pantoprazole 40 mg Tablet, Delayed Release (E.C.)  Commonly known as: PROTONIX   40 mg, Oral, DAILY  Refills: 0            STOP taking these medications.      ketorolac tromethamine 10 mg Tablet  Commonly known as: TORADOL            ASK your doctor about these medications.        Details   phenazopyridine 200 mg Tablet  Commonly known as: PYRIDIUM   200 mg, Oral, 3 TIMES DAILY  Qty: 9 Tablet  Refills: 0     TraZODone 300 mg Tablet  Commonly known as: DESYREL   600 mg, NIGHTLY  Refills: 0            Discharge med list refreshed?  YES     Allergies   Allergen Reactions    Cefaclor Anaphylaxis    Citalopram Mental Status Effect and Itching     HOSPITAL PROCEDURE(S):   No orders  of the defined types were placed in this encounter.      REASON FOR HOSPITALIZATION AND HOSPITAL COURSE   [name] is a 72 year old female admitted to [facility] with acute renal insufficiency.  She presented to the ED with complaints of diarrhea for the previous few days.  She was vitally stable on presentation.  Labs were significant for a an elevated creatinine of 4.61.  She was evaluated by Nephrology and started on IV fluids.  CT abdomen pelvis demonstrated dilated and fluid-filled loops of small bowel and colon consistent with enteritis.  Stool studies were negative.  She was treated empirically with levofloxacin with improvement in for diarrheal symptoms.  Repeat creatinine came down to 1.2.  At time of discharge her renal function was at baseline and she was vitally stable.  She will be discharged home with close PCP follow-up and repeat BMP in one week.      For further detail regarding hospital course please see daily progress notes    TRANSITION/POST DISCHARGE CARE/PENDING TESTS/REFERRALS:   - PCP follow up 1 week   - Repeat BMP in 1 week   - repeat CT abdomen and pelvis in 6-8 weeks to re-evaluate the adrenal gland  mass    CONDITION ON DISCHARGE:  A. Ambulation: Full ambulation  B. Self-care Ability: Complete  C. Cognitive Status Alert and Oriented x 3  D. Code status at discharge:       LINES/DRAINS/WOUNDS AT DISCHARGE:   Patient Lines/Drains/Airways Status       Active Line / Dialysis Catheter / Dialysis Graft / Drain / Airway / Wound       Name Placement date Placement time Site Days    Peripheral IV Left Wrist 07/27/22  0836  -- 2    Peripheral IV Right Cephalic  (lateral side of arm) 07/27/22  1024  -- 2    Foley Catheter 07/27/22  1001  -- 2    Wound  Other (comment) Left Cheek 06/14/21  0730  -- 410    Wound  Other (comment) Right;Upper Face 06/14/21  0730  -- 410    Wound  Other (comment) Anterior;Left;Lower Arm 06/14/21  0730  -- 410                    DISCHARGE DISPOSITION:  Home discharge  DISCHARGE INSTRUCTIONS:    No discharge procedures on file.       Venida Jarvis, DO    Copies sent to Care Team         Relationship Specialty Notifications Start End    Phil Dopp, [place] PCP - General FAMILY MEDICINE Admissions 06/03/21     Phone: [phone] Fax: [phone]         [address] [place] [address]            Referring providers can utilize [email or web address] to access their referred [facility] Medicine patient's information.

## 2022-08-03 ENCOUNTER — Telehealth (HOSPITAL_COMMUNITY): Payer: Self-pay

## 2022-08-03 NOTE — Telephone Encounter (Signed)
TCM visit completed 5/1 with Dr. Jeanella Craze.

## 2022-09-04 ENCOUNTER — Encounter (INDEPENDENT_AMBULATORY_CARE_PROVIDER_SITE_OTHER): Payer: Self-pay

## 2022-09-13 ENCOUNTER — Encounter (INDEPENDENT_AMBULATORY_CARE_PROVIDER_SITE_OTHER): Payer: Medicare Other

## 2023-02-05 ENCOUNTER — Encounter (HOSPITAL_BASED_OUTPATIENT_CLINIC_OR_DEPARTMENT_OTHER): Payer: Self-pay

## 2023-02-05 ENCOUNTER — Other Ambulatory Visit: Payer: Self-pay

## 2023-02-05 ENCOUNTER — Emergency Department (HOSPITAL_BASED_OUTPATIENT_CLINIC_OR_DEPARTMENT_OTHER): Payer: Medicare Other

## 2023-02-05 ENCOUNTER — Emergency Department
Admission: EM | Admit: 2023-02-05 | Discharge: 2023-02-05 | Disposition: A | Discharge status: Home / Self Care | Payer: Medicare Other | Attending: Emergency Medicine | Admitting: Emergency Medicine

## 2023-02-05 DIAGNOSIS — M48061 Spinal stenosis, lumbar region without neurogenic claudication: Secondary | ICD-10-CM | POA: Insufficient documentation

## 2023-02-05 DIAGNOSIS — M4316 Spondylolisthesis, lumbar region: Secondary | ICD-10-CM | POA: Insufficient documentation

## 2023-02-05 DIAGNOSIS — M47817 Spondylosis without myelopathy or radiculopathy, lumbosacral region: Secondary | ICD-10-CM | POA: Insufficient documentation

## 2023-02-05 DIAGNOSIS — M5440 Lumbago with sciatica, unspecified side: Secondary | ICD-10-CM | POA: Insufficient documentation

## 2023-02-05 DIAGNOSIS — M419 Scoliosis, unspecified: Secondary | ICD-10-CM | POA: Insufficient documentation

## 2023-02-05 DIAGNOSIS — M5430 Sciatica, unspecified side: Secondary | ICD-10-CM

## 2023-02-05 DIAGNOSIS — S39012A Strain of muscle, fascia and tendon of lower back, initial encounter: Secondary | ICD-10-CM | POA: Insufficient documentation

## 2023-02-05 DIAGNOSIS — M62838 Other muscle spasm: Secondary | ICD-10-CM | POA: Insufficient documentation

## 2023-02-05 DIAGNOSIS — X58XXXA Exposure to other specified factors, initial encounter: Secondary | ICD-10-CM | POA: Insufficient documentation

## 2023-02-05 HISTORY — DX: Calculus of kidney: N20.0

## 2023-02-05 MED ORDER — KETOROLAC 30 MG/ML (1 ML) INJECTION SOLUTION
INTRAMUSCULAR | Status: AC
Start: 2023-02-05 — End: 2023-02-05
  Filled 2023-02-05: qty 1

## 2023-02-05 MED ORDER — METHYLPREDNISOLONE ACETATE 80 MG/ML SUSPENSION FOR INJECTION
INTRAMUSCULAR | Status: AC
Start: 2023-02-05 — End: 2023-02-05
  Filled 2023-02-05: qty 1

## 2023-02-05 MED ORDER — CYCLOBENZAPRINE 10 MG TABLET
10.0000 mg | ORAL_TABLET | ORAL | Status: AC
Start: 2023-02-05 — End: 2023-02-05
  Administered 2023-02-05: 10 mg via ORAL

## 2023-02-05 MED ORDER — KETOROLAC 30 MG/ML (1 ML) INJECTION SOLUTION
30.0000 mg | INTRAMUSCULAR | Status: AC
Start: 2023-02-05 — End: 2023-02-05
  Administered 2023-02-05: 30 mg via INTRAMUSCULAR

## 2023-02-05 MED ORDER — CYCLOBENZAPRINE 10 MG TABLET
10.0000 mg | ORAL_TABLET | Freq: Three times a day (TID) | ORAL | 0 refills | Status: DC | PRN
Start: 2023-02-05 — End: 2024-02-24

## 2023-02-05 MED ORDER — KETOROLAC 10 MG TABLET
10.0000 mg | ORAL_TABLET | Freq: Four times a day (QID) | ORAL | 0 refills | Status: DC | PRN
Start: 2023-02-05 — End: 2023-03-24

## 2023-02-05 MED ORDER — METHYLPREDNISOLONE ACETATE 80 MG/ML SUSPENSION FOR INJECTION
80.0000 mg | Freq: Once | INTRAMUSCULAR | Status: AC
Start: 2023-02-05 — End: 2023-02-05
  Administered 2023-02-05: 80 mg via INTRAMUSCULAR

## 2023-02-05 MED ORDER — CYCLOBENZAPRINE 10 MG TABLET
ORAL_TABLET | ORAL | Status: AC
Start: 2023-02-05 — End: 2023-02-05
  Filled 2023-02-05: qty 1

## 2023-02-05 NOTE — ED Nurses Note (Signed)

## 2023-02-05 NOTE — ED Provider Notes (Signed)
[facility], [facility] - Emergency Department  ED Primary Provider Note  History of Present Illness   Chief Complaint   Patient presents with    Hip Pain     Brooke Maxwell is a 72 y.o. female who had concerns including Hip Pain.  Arrival: The patient arrived by Car complaining of lower back pain radiating around the right hip and down to her right thigh since last night.  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  She normally only work several hours at a time.  She denies any recent fall or injury.  No lifting anything heavy.  Patient denies any numbness or tingling going down the legs.  No weakness.  She denies any dysuria or increased frequency.  Patient states it is worse with weight-bearing and trying to walk.    HPI  Review of Systems   Review of Systems   Constitutional:  Positive for activity change and appetite change. Negative for chills and fever.   HENT:  Negative for ear pain and sore throat.    Eyes:  Negative for pain and visual disturbance.   Respiratory:  Negative for cough and shortness of breath.    Cardiovascular:  Negative for chest pain and palpitations.   Gastrointestinal:  Negative for abdominal pain and vomiting.   Genitourinary:  Negative for dysuria and hematuria.   Musculoskeletal:  Positive for arthralgias, back pain, gait problem and myalgias.   Skin:  Negative for color change and rash.   Neurological:  Negative for seizures and syncope.   All other systems reviewed and are negative.     Historical Data   History Reviewed This Encounter:     Physical Exam   ED Triage Vitals [02/05/23 0930]   BP (Non-Invasive) (!) 156/92   Heart Rate 85   Respiratory Rate 18   Temperature 36.2 C (97.1 F)   SpO2 96 %   Weight 83.9 kg (185 lb)   Height 1.575 m (5\' 2" )     Physical Exam  Vitals and nursing note reviewed.   Constitutional:       General: She is not in acute distress.     Appearance: She is well-developed. She is obese.   HENT:      Head:  Normocephalic and atraumatic.      Right Ear: External ear normal.      Left Ear: External ear normal.      Nose: Nose normal.      Mouth/Throat:      Mouth: Mucous membranes are dry.   Eyes:      Extraocular Movements: Extraocular movements intact.      Conjunctiva/sclera: Conjunctivae normal.      Pupils: Pupils are equal, round, and reactive to light.   Cardiovascular:      Rate and Rhythm: Normal rate and regular rhythm.      Pulses: Normal pulses.      Heart sounds: Normal heart sounds. No murmur heard.  Pulmonary:      Effort: Pulmonary effort is normal. No respiratory distress.      Breath sounds: Normal breath sounds.   Abdominal:      General: Bowel sounds are normal.      Palpations: Abdomen is soft.      Tenderness: There is no abdominal tenderness.   Musculoskeletal:         General: Tenderness present. No swelling.      Cervical back: Normal range of motion and neck  supple.      Comments: Positive tenderness over L4-L5 right side.  Positive tenderness right sciatic notch.   Skin:     General: Skin is warm and dry.      Capillary Refill: Capillary refill takes less than 2 seconds.   Neurological:      General: No focal deficit present.      Mental Status: She is alert and oriented to person, place, and time.   Psychiatric:         Mood and Affect: Mood normal.         Behavior: Behavior normal.         Thought Content: Thought content normal.       Patient Data   Labs Ordered/Reviewed - No data to display  XR LUMBOSACRAL SPINE   Preliminary Result by Edi, Radresults In (11/05 1021)      L4-5: There is grade 1 anterolisthesis of L4 on L5 with moderate disc space narrowing and vacuum phenomenon. This is similar to the 2023 study.      Diffuse degenerative changes throughout disc space narrowing and osteophytes and facet arthropathy      Moderate thoracolumbar sigmoid scoliosis similar previous.                        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  She denies any recent fall or injury.  No lifting anything heavy recently.  She denies any incontinence of urine or stool.  She denies any numbness or tingling going down the leg.  No one-sided weakness.  Patient will have an x-ray of her lower back.  She will be treated for results and then discharged home.  Patient will follow up with her family physician in the next 3 or 4 days.    Amount and/or Complexity of Data Reviewed  Radiology: ordered.    Risk  Prescription drug management.             Medications Administered in the ED   ketorolac (TORADOL) 30 mg/mL injection (has no administration in time range)   methylPREDNISolone acetate (DEPO-medrol) 80 mg/mL injection (has no administration in time range)   cyclobenzaprine (FLEXERIL) tablet (has no administration in time range)     Clinical Impression   Lumbosacral strain (Primary)   Sciatica, unspecified laterality   Muscle spasm       Disposition: Discharged               Clinical Impression   Lumbosacral strain (Primary)   Sciatica, unspecified laterality   Muscle spasm       Current Discharge Medication List        START taking these medications    Details   cyclobenzaprine (FLEXERIL) 10 mg Oral Tablet Take 1 Tablet (10 mg total) by mouth Three times a day as needed for Muscle spasms  Qty: 12 Tablet, Refills: 0      ketorolac tromethamine (TORADOL) 10 mg Oral Tablet Take 1 Tablet (10 mg total) by mouth Every 6 hours as needed for Pain  Qty: 20 Tablet, Refills: 0

## 2023-02-05 NOTE — ED Triage Notes (Signed)
Complains of pain in right hip and leg onset yesterday afternoon. Pain radiates from hip to lateral ankle.    Denies any injury.   Has taken Tylenol without relief.

## 2023-02-06 DIAGNOSIS — M4316 Spondylolisthesis, lumbar region: Secondary | ICD-10-CM

## 2023-02-06 DIAGNOSIS — M545 Low back pain, unspecified: Secondary | ICD-10-CM

## 2023-03-24 ENCOUNTER — Other Ambulatory Visit: Payer: Self-pay

## 2023-03-24 ENCOUNTER — Emergency Department (HOSPITAL_BASED_OUTPATIENT_CLINIC_OR_DEPARTMENT_OTHER): Payer: Medicare Other

## 2023-03-24 ENCOUNTER — Emergency Department
Admission: EM | Admit: 2023-03-24 | Discharge: 2023-03-24 | Disposition: A | Discharge status: Home / Self Care | Payer: Medicare Other | Source: Home / Self Care | Attending: Emergency Medicine | Admitting: Emergency Medicine

## 2023-03-24 ENCOUNTER — Encounter (HOSPITAL_BASED_OUTPATIENT_CLINIC_OR_DEPARTMENT_OTHER): Payer: Self-pay

## 2023-03-24 DIAGNOSIS — M19071 Primary osteoarthritis, right ankle and foot: Secondary | ICD-10-CM | POA: Insufficient documentation

## 2023-03-24 DIAGNOSIS — S93401A Sprain of unspecified ligament of right ankle, initial encounter: Secondary | ICD-10-CM | POA: Insufficient documentation

## 2023-03-24 DIAGNOSIS — E669 Obesity, unspecified: Secondary | ICD-10-CM | POA: Insufficient documentation

## 2023-03-24 DIAGNOSIS — S93409A Sprain of unspecified ligament of unspecified ankle, initial encounter: Secondary | ICD-10-CM

## 2023-03-24 DIAGNOSIS — Z6832 Body mass index (BMI) 32.0-32.9, adult: Secondary | ICD-10-CM | POA: Insufficient documentation

## 2023-03-24 MED ORDER — KETOROLAC 30 MG/ML (1 ML) INJECTION SOLUTION
INTRAMUSCULAR | Status: AC
Start: 2023-03-24 — End: 2023-03-24
  Filled 2023-03-24: qty 1

## 2023-03-24 MED ORDER — METHYLPREDNISOLONE ACETATE 80 MG/ML SUSPENSION FOR INJECTION
80.0000 mg | Freq: Once | INTRAMUSCULAR | Status: AC
Start: 2023-03-24 — End: 2023-03-24
  Administered 2023-03-24: 80 mg via INTRAMUSCULAR

## 2023-03-24 MED ORDER — KETOROLAC 30 MG/ML (1 ML) INJECTION SOLUTION
30.0000 mg | INTRAMUSCULAR | Status: AC
Start: 2023-03-24 — End: 2023-03-24
  Administered 2023-03-24: 30 mg via INTRAMUSCULAR

## 2023-03-24 MED ORDER — LISINOPRIL 5 MG TABLET
ORAL_TABLET | ORAL | Status: AC
Start: 2023-03-24 — End: 2023-03-24
  Filled 2023-03-24: qty 2

## 2023-03-24 MED ORDER — LISINOPRIL 5 MG TABLET
10.0000 mg | ORAL_TABLET | ORAL | Status: AC
Start: 2023-03-24 — End: 2023-03-24
  Administered 2023-03-24: 10 mg via ORAL

## 2023-03-24 MED ORDER — METHYLPREDNISOLONE ACETATE 80 MG/ML SUSPENSION FOR INJECTION
INTRAMUSCULAR | Status: AC
Start: 2023-03-24 — End: 2023-03-24
  Filled 2023-03-24: qty 1

## 2023-03-24 MED ORDER — KETOROLAC 10 MG TABLET
10.0000 mg | ORAL_TABLET | Freq: Four times a day (QID) | ORAL | 0 refills | Status: DC | PRN
Start: 2023-03-24 — End: 2023-11-16

## 2023-03-24 NOTE — ED Nurses Note (Signed)
Patient discharged home with family.  AVS reviewed with patient/care giver.  A written copy of the AVS and discharge instructions was given to the patient/care giver. Scripts handed to patient/care giver. Questions sufficiently answered as needed.  Patient/care giver encouraged to follow up with PCP as indicated.  In the event of an emergency, patient/care giver instructed to call 911 or go to the nearest emergency room.

## 2023-03-24 NOTE — ED Provider Notes (Signed)
[facility], [facility] - Emergency Department  ED Primary Provider Note  History of Present Illness   Chief Complaint   Patient presents with    Ankle Pain     Pain in right ankle ongoing for 3-4 weeks but unbearable now.  Patient having trouble ambulating.     Brooke Maxwell is a 72 y.o. female who had concerns including Ankle Pain.  Arrival: The patient arrived by Car complaining pain in the medial aspect of the right ankle which has been bothering her for many months.  Patient states she had fluid drained off her ankle several years ago and ever since then her ankle has shifted to where she walks like a penguin.  Patient denies any numbness or tingling in the extremity.  No weakness in the ankle.  She denies injuring it recently.  She states that it hurts every time she walks.    HPI  Review of Systems   Review of Systems   Constitutional:  Positive for activity change. Negative for chills and fever.   HENT:  Negative for ear pain and sore throat.    Eyes:  Negative for pain and visual disturbance.   Respiratory:  Negative for cough and shortness of breath.    Cardiovascular:  Negative for chest pain and palpitations.   Gastrointestinal:  Negative for abdominal pain and vomiting.   Genitourinary:  Negative for dysuria and hematuria.   Musculoskeletal:  Positive for arthralgias, gait problem, joint swelling and myalgias. Negative for back pain.   Skin:  Negative for color change and rash.   Neurological:  Negative for seizures and syncope.   All other systems reviewed and are negative.     Historical Data   History Reviewed This Encounter:     Physical Exam   ED Triage Vitals   BP (Non-Invasive) 03/24/23 1314 (!) 156/92   Heart Rate 03/24/23 1313 63   Respiratory Rate 03/24/23 1313 18   Temperature 03/24/23 1313 (!) 35.8 C (96.4 F)   SpO2 03/24/23 1313 98 %   Weight 03/24/23 1313 79.4 kg (175 lb)   Height 03/24/23 1313 1.575 m (5\' 2" )     Physical Exam  Vitals and nursing note reviewed.    Constitutional:       General: She is not in acute distress.     Appearance: She is well-developed. She is obese.   HENT:      Head: Normocephalic and atraumatic.      Right Ear: External ear normal.      Left Ear: External ear normal.      Nose: Nose normal.      Mouth/Throat:      Mouth: Mucous membranes are dry.   Eyes:      Extraocular Movements: Extraocular movements intact.      Conjunctiva/sclera: Conjunctivae normal.      Pupils: Pupils are equal, round, and reactive to light.   Cardiovascular:      Rate and Rhythm: Normal rate and regular rhythm.      Pulses: Normal pulses.      Heart sounds: Normal heart sounds. No murmur heard.  Pulmonary:      Effort: Pulmonary effort is normal. No respiratory distress.      Breath sounds: Normal breath sounds.   Abdominal:      General: Bowel sounds are normal.      Palpations: Abdomen is soft.      Tenderness: There is no abdominal tenderness.   Musculoskeletal:  General: Swelling, tenderness and deformity present.      Cervical back: Normal range of motion and neck supple.      Comments: Positive swelling with tenderness over the medial aspect of the ankle on the right side.  No crepitus or deformity.  Patient's ankle is positioned like a pain going ones foot.   Skin:     General: Skin is warm and dry.      Capillary Refill: Capillary refill takes less than 2 seconds.   Neurological:      Mental Status: She is alert.   Psychiatric:         Mood and Affect: Mood normal.       Patient Data   Labs Ordered/Reviewed - No data to display  XR ANKLE RIGHT   Final Result by Edi, Radresults In (12/22 1411)   Soft tissue swelling                         Radiologist location ID: [id number]           Medical Decision Making        Medical Decision Making  Patient is 72 year old white female complaining pain in the medial aspect of her right ankle.  She states it has been like this for many years.  She had her ankle drained in the past and states it healed that way with  the ankle shifted sideways.  Patient states every time she weight bears it gets worse and worse.  Patient denies any recent fall or injury.  No numbness or tingling in the extremity.  No weakness.  Patient will have an x-ray of her right ankle.  She will then be treated for results.  Patient will follow up with orthopedics in the next 2-3 days.    Amount and/or Complexity of Data Reviewed  Radiology: ordered.    Risk  Prescription drug management.             Medications Administered in the ED   ketorolac (TORADOL) 30 mg/mL injection (has no administration in time range)   methylPREDNISolone acetate (DEPO-medrol) 80 mg/mL injection (has no administration in time range)     Clinical Impression   Degenerative joint disease, ankle, foot, toe, right (Primary)   Grade 2 ankle sprain       Disposition: Discharged               Clinical Impression   Degenerative joint disease, ankle, foot, toe, right (Primary)   Grade 2 ankle sprain       Current Discharge Medication List

## 2023-03-29 ENCOUNTER — Encounter (HOSPITAL_BASED_OUTPATIENT_CLINIC_OR_DEPARTMENT_OTHER): Payer: Self-pay

## 2023-03-29 ENCOUNTER — Other Ambulatory Visit: Payer: Self-pay

## 2023-03-29 ENCOUNTER — Emergency Department
Admission: EM | Admit: 2023-03-29 | Discharge: 2023-03-29 | Disposition: A | Discharge status: Home / Self Care | Payer: Medicare Other

## 2023-03-29 DIAGNOSIS — G8929 Other chronic pain: Secondary | ICD-10-CM

## 2023-03-29 DIAGNOSIS — M25571 Pain in right ankle and joints of right foot: Secondary | ICD-10-CM | POA: Insufficient documentation

## 2023-03-29 NOTE — ED Provider Notes (Signed)
Emergency Medicine      Name: Brooke Maxwell  Age and Gender: 72 y.o. female  Date of Birth: Mar 21, 1951  MRN: [id number]  PCP: Phil Dopp, DO    CC:  Chief Complaint   Patient presents with    Ankle Pain     Patient states that she was here this last week with her right ankle swelling and pain.  Had x-ray and was told it was just sprained but it has not gotten any better.  States her foot is turning out.       HPI:  Brooke Maxwell is a 72 y.o. White female who presents to the ER with chronic right ankle discomfort.    Below pertinent information reviewed with patient:  Past Medical History:   Diagnosis Date    Arthropathy     Bipolar disorder, unspecified (CMS HCC)     Depression     Kidney stone     PUD (peptic ulcer disease)     Wears dentures            Allergies   Allergen Reactions    Cefaclor Anaphylaxis    Citalopram Mental Status Effect and Itching       Past Surgical History:   Procedure Laterality Date    ABDOMINAL HERNIA REPAIR      ABDOMINAL HYSTERECTOMY      ANKLE SURGERY Right     COLECTOMY PARTIAL / TOTAL      HX BREAST BIOPSY Left     HX CARPAL TUNNEL RELEASE Bilateral     HX CYST REMOVAL      Cyst removed from neck    LITHOTRIPSY          Social History     Socioeconomic History    Marital status: Divorced   Tobacco Use    Smoking status: Never    Smokeless tobacco: Never   Vaping Use    Vaping status: Never Used   Substance and Sexual Activity    Alcohol use: Never    Drug use: Never     Social Determinants of Health     Financial Resource Strain: Low Risk  (07/28/2022)    Financial Resource Strain     SDOH Financial: No   Transportation Needs: Low Risk  (07/28/2022)    Transportation Needs     SDOH Transportation: No   Social Connections: Low Risk  (07/28/2022)    Social Connections     SDOH Social Isolation: 5 or more times a week   Housing Stability: Low Risk  (07/28/2022)    Housing Stability     SDOH Housing Situation: I have housing.     SDOH Housing Worry: No       ROS:  No other overt  positive review of systems are noted other than stated in the HPI.      Objective:    ED Triage Vitals [03/29/23 1457]   BP (Non-Invasive) (!) 173/107   Heart Rate 70   Respiratory Rate 18   Temperature 36.2 C (97.2 F)   SpO2 98 %   Weight 79.4 kg (175 lb)   Height 1.549 m (5\' 1" )     Filed Vitals:    03/29/23 1457   BP: (!) 173/107   Pulse: 70   Resp: 18   Temp: 36.2 C (97.2 F)   SpO2: 98%       Nursing notes and vital signs reviewed.    Constitutional - No acute distress.  Alert and  Active.  HEENT - Normocephalic. Atraumatic. PERRL. EOMI. Conjunctiva clear. Moist mucous membranes.   Cardiac - Regular rate and rhythm. Intact distal pulses.  Respiratory/Chest - Normal respiratory effort.   Musculoskeletal - Good AROM.  Right ankle medial edema.  Tenderness reported.  ROM and PMS intact and equal.  Skin - Warm and dry, without any rashes or other lesions.  Neuro - Alert and oriented x 3. Cranial nerves II-XII are grossly intact.  Moving all extremities symmetrically. Normal gait.  Psych - Normal mood and affect. Behavior is normal                  Any pertinent labs and imaging obtained during this encounter reviewed below in MDM.    MDM/ED Course:        Medical Decision Making  Brooke Maxwell is a 72 y.o. White female who presents to the ER with chronic right ankle discomfort.    Patient states she was unable to follow up with your primary care provider as instructed with last visit states that they are of town.  Patient also over follows with a podiatrist with a pending appointment on January 7.  Patient denies any new injury.  Patient advises this is a chronic issue.  Patient requesting something for pain and a work excuse due to being a case your standing on her feet.    Right ankle does appear to have some medial edema.  No discoloration.  PMS present.  ROM present.  Patient ambulate without  assistance or issue.  Patient refuses to wear an ankle brace and/or Ace wrap, states that it does not  help.    Discussed chronicity of complaint and need for orthopedic or podiatry specialty services.  Advise pain management could be through her primary care provider.  Work excuse provided.  Walking boot ordered.  Patient instructed to maintain follow up as instructed and scheduled.    Patient to return to the emergency department if symptoms persist, worsen or new symptoms of concern develop.  Patient indicated and verbalized understanding and agreement with plan of care.          ED Course as of 03/29/23 1527   Fri Mar 29, 2023   1524 Discussed with patient that pain management would be through her primary care provider.  Patient also encouraged to follow up with Podiatry as scheduled.  Patient refuses the use of Ace wrap her ankle splint, a walking boot we will be provided.    Patient instructed to follow up with primary care provider and maintain Podiatry appointment as scheduled.    Patient indicated and verbalized understanding and agreement.       Orders Placed This Encounter    BEDSIDE  SPLINT APPLICATION         Any procedures:  Splint/Cast    Date/Time: 03/29/2023 3:25 PM    Performed by: Corinna Lines, FNP-BC  Authorized by: Corinna Lines, FNP-BC    Consent:     Consent obtained:  Verbal    Consent given by:  Patient    Risks, benefits, and alternatives were discussed: yes      Risks discussed:  Discoloration    Alternatives discussed:  No treatment  Universal protocol:     Patient identity confirmed:  Verbally with patient and arm band  Pre-procedure details:     Distal neurologic exam:  Normal    Distal perfusion: distal pulses strong    Procedure details:     Location:  Foot  Foot location:  R foot    Cast type:  Short leg walking    Supplies:  Prefabricated splint  Post-procedure details:     Distal neurologic exam:  Normal    Distal perfusion: distal pulses strong      Procedure completion:  Tolerated well, no immediate complications      Impression:   Clinical Impression   Chronic pain of right  ankle (Primary)       Disposition: Discharged    / Corinna Lines, FNP-BC  03/29/2023, 14:59  [facility]  Department of Emergency Medicine  [place]    Portions of this note may have been dictated using voice recognition software.     -----------------------  No results found for this or any previous visit (from the past 12 hour(s)).  No orders to display

## 2023-03-29 NOTE — Discharge Instructions (Signed)
Please follow up with your primary care provider as instructed previously.  Please maintain your podiatry appointment.  Return to the emergency department if symptoms persist, worsen or new symptoms of concern develop.

## 2023-03-29 NOTE — ED Nurses Note (Signed)
Walking boot applied to RT foot

## 2023-09-30 ENCOUNTER — Emergency Department (HOSPITAL_BASED_OUTPATIENT_CLINIC_OR_DEPARTMENT_OTHER)

## 2023-09-30 ENCOUNTER — Emergency Department
Admission: EM | Admit: 2023-09-30 | Discharge: 2023-09-30 | Disposition: A | Discharge status: Home / Self Care | Attending: Family | Admitting: Family

## 2023-09-30 ENCOUNTER — Other Ambulatory Visit: Payer: Self-pay

## 2023-09-30 ENCOUNTER — Encounter (HOSPITAL_BASED_OUTPATIENT_CLINIC_OR_DEPARTMENT_OTHER): Payer: Self-pay

## 2023-09-30 DIAGNOSIS — K219 Gastro-esophageal reflux disease without esophagitis: Secondary | ICD-10-CM

## 2023-09-30 DIAGNOSIS — N390 Urinary tract infection, site not specified: Secondary | ICD-10-CM | POA: Insufficient documentation

## 2023-09-30 DIAGNOSIS — K2980 Duodenitis without bleeding: Secondary | ICD-10-CM | POA: Insufficient documentation

## 2023-09-30 DIAGNOSIS — K2900 Acute gastritis without bleeding: Secondary | ICD-10-CM | POA: Insufficient documentation

## 2023-09-30 DIAGNOSIS — E876 Hypokalemia: Secondary | ICD-10-CM | POA: Insufficient documentation

## 2023-09-30 DIAGNOSIS — R1012 Left upper quadrant pain: Secondary | ICD-10-CM

## 2023-09-30 LAB — CBC WITH DIFF
BASOPHIL #: 0.03 x10ˆ3/uL (ref 0.00–0.10)
BASOPHIL %: 0 % (ref 0–1)
EOSINOPHIL #: 0.43 x10ˆ3/uL (ref 0.00–0.50)
EOSINOPHIL %: 3 % (ref 1–7)
HCT: 40 % (ref 31.2–41.9)
HGB: 13.2 g/dL (ref 10.9–14.3)
LYMPHOCYTE #: 1.7 x10ˆ3/uL (ref 1.10–3.10)
LYMPHOCYTE %: 13 % — ABNORMAL LOW (ref 16–46)
MCH: 32 pg (ref 24.7–32.8)
MCHC: 33.1 g/dL (ref 32.3–35.6)
MCV: 96.5 fL — ABNORMAL HIGH (ref 75.5–95.3)
MONOCYTE #: 0.84 x10ˆ3/uL (ref 0.20–0.90)
MONOCYTE %: 6 % (ref 4–11)
MPV: 6.9 fL — ABNORMAL LOW (ref 7.9–10.8)
NEUTROPHIL #: 10.45 x10ˆ3/uL — ABNORMAL HIGH (ref 1.90–8.20)
NEUTROPHIL %: 78 % — ABNORMAL HIGH (ref 43–77)
PLATELETS: 330 10*3/uL (ref 140–440)
RBC: 4.14 x10ˆ6/uL (ref 3.63–4.92)
RDW: 15.9 % (ref 12.3–17.7)
WBC: 13.5 x10ˆ3/uL — ABNORMAL HIGH (ref 3.8–11.8)

## 2023-09-30 LAB — URINALYSIS, MACRO/MICRO
BLOOD: NEGATIVE mg/dL
GLUCOSE: NEGATIVE mg/dL
KETONES: NEGATIVE mg/dL
NITRITE: NEGATIVE
PH: 6 (ref 4.6–8.0)
PROTEIN: 30 mg/dL — AB
SPECIFIC GRAVITY: 1.025 (ref 1.003–1.035)
UROBILINOGEN: 0.2 mg/dL (ref 0.2–1.0)

## 2023-09-30 LAB — COMPREHENSIVE METABOLIC PANEL, NON-FASTING
ALBUMIN/GLOBULIN RATIO: 0.8 (ref 0.8–1.4)
ALBUMIN: 3.7 g/dL (ref 3.4–5.0)
ALKALINE PHOSPHATASE: 96 U/L (ref 46–116)
ALT (SGPT): 21 U/L (ref <=78)
ANION GAP: 12 mmol/L (ref 4–13)
AST (SGOT): 20 U/L (ref 15–37)
BILIRUBIN TOTAL: 0.5 mg/dL (ref 0.2–1.0)
BUN/CREA RATIO: 22
BUN: 20 mg/dL — ABNORMAL HIGH (ref 7–18)
CALCIUM, CORRECTED: 9.5 mg/dL
CALCIUM: 9.3 mg/dL (ref 8.5–10.1)
CHLORIDE: 102 mmol/L (ref 98–107)
CO2 TOTAL: 23 mmol/L (ref 21–32)
CREATININE: 0.93 mg/dL (ref 0.55–1.02)
ESTIMATED GFR: 66 mL/min/1.73mˆ2 (ref >59)
GLOBULIN: 4.5
GLUCOSE: 134 mg/dL — ABNORMAL HIGH (ref 74–106)
OSMOLALITY, CALCULATED: 279 mosm/kg (ref 270–290)
POTASSIUM: 3.2 mmol/L — ABNORMAL LOW (ref 3.5–5.1)
PROTEIN TOTAL: 8.2 g/dL (ref 6.4–8.2)
SODIUM: 137 mmol/L (ref 136–145)

## 2023-09-30 LAB — LIPASE: LIPASE: 23 U/L (ref 16–77)

## 2023-09-30 LAB — URINALYSIS, MICROSCOPIC: HYALINE CASTS: 5 /LPF — ABNORMAL HIGH (ref <0)

## 2023-09-30 LAB — TROPONIN-I: TROPONIN I: 3 ng/L (ref <15)

## 2023-09-30 MED ORDER — NITROFURANTOIN MONOHYDRATE/MACROCRYSTALS 100 MG CAPSULE
100.0000 mg | ORAL_CAPSULE | Freq: Two times a day (BID) | ORAL | 0 refills | Status: AC
Start: 2023-09-30 — End: 2023-10-07

## 2023-09-30 MED ORDER — SODIUM CHLORIDE 0.9 % (FLUSH) INJECTION SYRINGE
3.0000 mL | INJECTION | Freq: Three times a day (TID) | INTRAMUSCULAR | Status: DC
Start: 2023-09-30 — End: 2023-09-30

## 2023-09-30 MED ORDER — DICYCLOMINE 10 MG CAPSULE
10.0000 mg | ORAL_CAPSULE | Freq: Four times a day (QID) | ORAL | 0 refills | Status: AC
Start: 2023-09-30 — End: 2023-10-07

## 2023-09-30 MED ORDER — FAMOTIDINE (PF) 20 MG/2 ML INTRAVENOUS SOLUTION
20.0000 mg | INTRAVENOUS | Status: AC
Start: 2023-09-30 — End: 2023-09-30
  Administered 2023-09-30: 20 mg via INTRAVENOUS

## 2023-09-30 MED ORDER — PANTOPRAZOLE 40 MG TABLET,DELAYED RELEASE
40.0000 mg | DELAYED_RELEASE_TABLET | Freq: Two times a day (BID) | ORAL | 0 refills | Status: AC
Start: 2023-09-30 — End: 2023-10-30

## 2023-09-30 MED ORDER — PANTOPRAZOLE 40 MG INTRAVENOUS SOLUTION
40.0000 mg | INTRAVENOUS | Status: AC
Start: 2023-09-30 — End: 2023-09-30
  Administered 2023-09-30: 40 mg via INTRAVENOUS

## 2023-09-30 MED ORDER — PANTOPRAZOLE 40 MG INTRAVENOUS SOLUTION
INTRAVENOUS | Status: AC
Start: 2023-09-30 — End: 2023-09-30
  Filled 2023-09-30: qty 10

## 2023-09-30 MED ORDER — POTASSIUM CHLORIDE ER 20 MEQ TABLET,EXTENDED RELEASE(PART/CRYST)
ORAL_TABLET | ORAL | Status: AC
Start: 2023-09-30 — End: 2023-09-30
  Filled 2023-09-30: qty 1

## 2023-09-30 MED ORDER — FAMOTIDINE (PF) 20 MG/2 ML INTRAVENOUS SOLUTION
INTRAVENOUS | Status: AC
Start: 2023-09-30 — End: 2023-09-30
  Filled 2023-09-30: qty 2

## 2023-09-30 MED ORDER — FAMOTIDINE 40 MG TABLET
40.0000 mg | ORAL_TABLET | Freq: Two times a day (BID) | ORAL | 0 refills | Status: AC
Start: 2023-09-30 — End: 2023-10-30

## 2023-09-30 MED ORDER — POTASSIUM CHLORIDE ER 20 MEQ TABLET,EXTENDED RELEASE(PART/CRYST)
20.0000 meq | ORAL_TABLET | ORAL | Status: AC
Start: 2023-09-30 — End: 2023-09-30
  Administered 2023-09-30: 20 meq via ORAL

## 2023-09-30 MED ORDER — SODIUM CHLORIDE 0.9 % (FLUSH) INJECTION SYRINGE
3.0000 mL | INJECTION | INTRAMUSCULAR | Status: DC | PRN
Start: 2023-09-30 — End: 2023-09-30

## 2023-09-30 MED ORDER — ONDANSETRON 4 MG DISINTEGRATING TABLET
4.0000 mg | ORAL_TABLET | Freq: Three times a day (TID) | ORAL | 0 refills | Status: DC | PRN
Start: 2023-09-30 — End: 2023-11-11

## 2023-09-30 NOTE — ED Provider Notes (Signed)
 [facility], [place] - Emergency Department  ED Primary Note  History of Present Illness   Brooke Maxwell is a 73 y.o. female who had concerns including Abdominal Pain. Pt states luq abd pain for a few days hx of GERD and gi issues last scope 5 years ago denies cp nor sob  Review of Systems   Constitutional: No fever, chills or weakness   Skin: No rash or diaphoresis  HENT: No headaches, or congestion  Eyes: No vision changes or photophobia   Cardio: No chest pain, palpitations or leg swelling   Respiratory: No cough, wheezing or SOB  GI:  + abd pain  nausea, vomiting no  stool changes  GU:  No dysuria, hematuria, or increased frequency  MSK: No muscle aches, joint or back pain  Neuro: No seizures, LOC, numbness, tingling, or focal weakness  Psychiatric: No depression, SI or substance abuse  All other systems reviewed and are negative.      Physical Exam   ED Triage Vitals [09/30/23 1639]   BP (Non-Invasive) (!) 170/103   Heart Rate 97   Respiratory Rate 20   Temperature 36.5 C (97.7 F)   SpO2 97 %   Weight 97.5 kg (215 lb)   Height 1.549 m (5' 1)     Constitutional:  73 y.o. female who appears in no distress. Normal color, no cyanosis.   HENT:   Head: Normocephalic and atraumatic.   Mouth/Throat: Oropharynx is clear and moist.   Eyes: EOMI, PERRL   Neck: Trachea midline. Neck supple.  Cardiovascular: RRR, No murmurs, rubs or gallops. Intact distal pulses.  Pulmonary/Chest: BS equal bilaterally. No respiratory distress. No wheezes, rales or chest tenderness.   Abdominal: Bowel sounds present and normal. Abdomen soft, no tenderness, no rebound and no guarding.  Back: No midline spinal tenderness, no paraspinal tenderness, no CVA tenderness.           Musculoskeletal: No edema, tenderness or deformity.  Skin: warm and dry. No rash, erythema, pallor or cyanosis  Psychiatric: normal mood and affect. Behavior is normal.   Neurological: Patient keenly alert and responsive, easily able to raise  eyebrows, facial muscles/expressions symmetric, speaking in fluent sentences, moving all extremities equally and fully, normal gait  Patient Data   Labs Ordered/Reviewed   COMPREHENSIVE METABOLIC PANEL, NON-FASTING - Abnormal; Notable for the following components:       Result Value    POTASSIUM 3.2 (*)     BUN 20 (*)     GLUCOSE 134 (*)     All other components within normal limits    Narrative:     Estimated Glomerular Filtration Rate (eGFR) is calculated using the CKD-EPI (2021) equation, intended for patients 12 years of age and older. If gender is not documented or unknown, there will be no eGFR calculation.   CBC WITH DIFF - Abnormal; Notable for the following components:    WBC 13.5 (*)     MCV 96.5 (*)     MPV 6.9 (*)     NEUTROPHIL % 78 (*)     LYMPHOCYTE % 13 (*)     NEUTROPHIL # 10.45 (*)     All other components within normal limits   URINALYSIS, MACRO/MICRO - Abnormal; Notable for the following components:    LEUKOCYTES Trace (*)     PROTEIN 30 (*)     BILIRUBIN Small (*)     All other components within normal limits   URINALYSIS, MICROSCOPIC - Abnormal; Notable for  the following components:    RBCS 3-5 (*)     BACTERIA Few (*)     MUCOUS Moderate (*)     HYALINE CASTS 5 (*)     WBCS 20-50 (*)     All other components within normal limits   LIPASE - Normal   TROPONIN-I - Normal    Narrative:     Values received on females ranging between 12-15 ng/L MUST include the next serial troponin to review changes in the delta differences as the reference range for the Access II chemistry analyzer is lower than the established reference range.     URINE CULTURE,ROUTINE   CBC/DIFF    Narrative:     The following orders were created for panel order CBC/DIFF.  Procedure                               Abnormality         Status                     ---------                               -----------         ------                     CBC WITH IPQQ[[id number]]                Abnormal            Final result                  Please view results for these tests on the individual orders.   URINALYSIS WITH REFLEX MICROSCOPIC AND CULTURE IF POSITIVE    Narrative:     The following orders were created for panel order URINALYSIS WITH REFLEX MICROSCOPIC AND CULTURE IF POSITIVE.  Procedure                               Abnormality         Status                     ---------                               -----------         ------                     URINALYSIS, MACRO/MICRO[[id number]]      Abnormal            Final result               URINALYSIS, MICROSCOPIC[[id number]]      Abnormal            Final result                 Please view results for these tests on the individual orders.     CT ABDOMEN PELVIS WO IV CONTRAST   Final Result by Edi, Radresults In (06/30 1848)   There is wall thickening and inflammation involving the distal stomach and duodenum with surrounding fat stranding. Recommend further evaluation with direct visualization with EGD.  Radiologist location ID: [id number]           Medical Decision Making   Diff dx of gastritis abd pain pud atypical mi pancreatitis. Pt has inflammation in stomach and duodenum wbc 13 , UTI ,K 3.2 trace . No nvd in er. No cp no sob. Neg trop. No st elevation. Pt had some relief. Still had some burning in stomach. Declined further meds here. States she is better and wants to go home. Urged to get another scope.       Medications Ordered/Administered in the ED   NS flush syringe (has no administration in time range)   NS flush syringe (has no administration in time range)   pantoprazole  (PROTONIX ) 4 mg/mL injection (40 mg Intravenous Given 09/30/23 1803)   famotidine (PEPCID) 10 mg/mL injection (20 mg Intravenous Given 09/30/23 1803)   potassium chloride (K-DUR) extended release tablet (20 mEq Oral Given 09/30/23 1910)     Clinical Impression   Acute gastritis without hemorrhage, unspecified gastritis type (Primary)   Duodenitis   Hypokalemia   UTI (urinary tract infection)   LUQ  abdominal pain       Disposition: Discharged

## 2023-09-30 NOTE — Discharge Instructions (Signed)
 Please follow up for another scope asap.

## 2023-09-30 NOTE — ED Nurses Note (Signed)
 Patient discharged home with family/self with treatment of initial complaint upon arrival to ER.  AVS reviewed by physician with patient/care giver.  A written copy of the AVS and discharge instructions was given to the patient/care giver. Scripts handed to patient/care giver or sent to pharmacy indicated on AVS print out. Pt verbalized understanding of taking any medications as prescribed. Questions and concerns sufficiently answered as needed.  Patient/care giver encouraged to follow up with PCP as indicated.  In the event of an emergency, patient/care giver instructed to call 911 or go to the nearest emergency room. No other concerns voiced at time of discharge to physician or nurse. Respiration even and unlabored. Pt ambulatory out of ED.

## 2023-09-30 NOTE — ED Nurses Note (Signed)
 Pt offered any comfort measures (food, water, repositioning, restroom, etc.). Pt expresses no other concerns or needs at this time. Plan of care ongoing. Call light in reach.

## 2023-10-01 DIAGNOSIS — I10 Essential (primary) hypertension: Secondary | ICD-10-CM

## 2023-10-01 LAB — ECG 12 LEAD
Atrial Rate: 91 {beats}/min
Calculated P Axis: 29 degrees
Calculated R Axis: -13 degrees
Calculated T Axis: 21 degrees
PR Interval: 148 ms
QRS Duration: 86 ms
QT Interval: 384 ms
QTC Calculation: 472 ms
Ventricular rate: 91 {beats}/min

## 2023-10-03 LAB — URINE CULTURE,ROUTINE: URINE CULTURE: 3000 — AB

## 2023-10-07 ENCOUNTER — Ambulatory Visit (HOSPITAL_BASED_OUTPATIENT_CLINIC_OR_DEPARTMENT_OTHER): Payer: Self-pay | Admitting: Physician Assistant

## 2023-11-11 ENCOUNTER — Other Ambulatory Visit: Payer: Self-pay

## 2023-11-11 ENCOUNTER — Emergency Department (HOSPITAL_BASED_OUTPATIENT_CLINIC_OR_DEPARTMENT_OTHER)

## 2023-11-11 ENCOUNTER — Emergency Department
Admission: EM | Admit: 2023-11-11 | Discharge: 2023-11-11 | Disposition: A | Discharge status: Home / Self Care | Attending: Emergency Medicine | Admitting: Emergency Medicine

## 2023-11-11 ENCOUNTER — Encounter (HOSPITAL_BASED_OUTPATIENT_CLINIC_OR_DEPARTMENT_OTHER): Payer: Self-pay

## 2023-11-11 DIAGNOSIS — N3000 Acute cystitis without hematuria: Secondary | ICD-10-CM | POA: Insufficient documentation

## 2023-11-11 DIAGNOSIS — E876 Hypokalemia: Secondary | ICD-10-CM | POA: Insufficient documentation

## 2023-11-11 DIAGNOSIS — E860 Dehydration: Secondary | ICD-10-CM | POA: Insufficient documentation

## 2023-11-11 DIAGNOSIS — K529 Noninfective gastroenteritis and colitis, unspecified: Secondary | ICD-10-CM | POA: Insufficient documentation

## 2023-11-11 DIAGNOSIS — R112 Nausea with vomiting, unspecified: Secondary | ICD-10-CM | POA: Insufficient documentation

## 2023-11-11 DIAGNOSIS — R197 Diarrhea, unspecified: Secondary | ICD-10-CM

## 2023-11-11 LAB — COMPREHENSIVE METABOLIC PANEL, NON-FASTING
ALBUMIN/GLOBULIN RATIO: 0.9 (ref 0.8–1.4)
ALBUMIN: 3.8 g/dL (ref 3.4–5.0)
ALKALINE PHOSPHATASE: 85 U/L (ref 46–116)
ALT (SGPT): 19 U/L (ref <=78)
ANION GAP: 12 mmol/L (ref 4–13)
AST (SGOT): 25 U/L (ref 15–37)
BILIRUBIN TOTAL: 0.5 mg/dL (ref 0.2–1.0)
BUN/CREA RATIO: 8
BUN: 8 mg/dL (ref 7–18)
CALCIUM, CORRECTED: 9.5 mg/dL
CALCIUM: 9.3 mg/dL (ref 8.5–10.1)
CHLORIDE: 100 mmol/L (ref 98–107)
CO2 TOTAL: 27 mmol/L (ref 21–32)
CREATININE: 0.96 mg/dL (ref 0.55–1.02)
ESTIMATED GFR: 63 mL/min/1.73mˆ2 (ref >59)
GLOBULIN: 4.1
GLUCOSE: 137 mg/dL — ABNORMAL HIGH (ref 74–106)
OSMOLALITY, CALCULATED: 278 mosm/kg (ref 270–290)
POTASSIUM: 2.7 mmol/L — CL (ref 3.5–5.1)
PROTEIN TOTAL: 7.9 g/dL (ref 6.4–8.2)
SODIUM: 139 mmol/L (ref 136–145)

## 2023-11-11 LAB — URINALYSIS, MACRO/MICRO
BILIRUBIN: NEGATIVE mg/dL
GLUCOSE: NEGATIVE mg/dL
KETONES: NEGATIVE mg/dL
NITRITE: NEGATIVE
PH: 7 (ref 4.6–8.0)
PROTEIN: NEGATIVE mg/dL
SPECIFIC GRAVITY: 1.02 (ref 1.003–1.035)
UROBILINOGEN: 0.2 mg/dL (ref 0.2–1.0)

## 2023-11-11 LAB — URINALYSIS, MICROSCOPIC: HYALINE CASTS: 2 /LPF — ABNORMAL HIGH (ref <0)

## 2023-11-11 LAB — CBC WITH DIFF
BASOPHIL #: 0.02 x10ˆ3/uL (ref 0.00–0.10)
BASOPHIL %: 0 % (ref 0–1)
EOSINOPHIL #: 0.32 x10ˆ3/uL (ref 0.00–0.50)
EOSINOPHIL %: 4 % (ref 1–7)
HCT: 40.6 % (ref 31.2–41.9)
HGB: 13.2 g/dL (ref 10.9–14.3)
LYMPHOCYTE #: 1.57 x10ˆ3/uL (ref 1.10–3.10)
LYMPHOCYTE %: 19 % (ref 16–46)
MCH: 31.3 pg (ref 24.7–32.8)
MCHC: 32.6 g/dL (ref 32.3–35.6)
MCV: 96.2 fL — ABNORMAL HIGH (ref 75.5–95.3)
MONOCYTE #: 0.69 x10ˆ3/uL (ref 0.20–0.90)
MONOCYTE %: 8 % (ref 4–11)
MPV: 6.6 fL — ABNORMAL LOW (ref 7.9–10.8)
NEUTROPHIL #: 5.76 x10ˆ3/uL (ref 1.90–8.20)
NEUTROPHIL %: 69 % (ref 43–77)
PLATELETS: 329 x10ˆ3/uL (ref 140–440)
RBC: 4.22 x10ˆ6/uL (ref 3.63–4.92)
RDW: 17 % (ref 12.3–17.7)
WBC: 8.4 x10ˆ3/uL (ref 3.8–11.8)

## 2023-11-11 LAB — LACTIC ACID LEVEL W/ REFLEX FOR LEVEL >2.0: LACTIC ACID: 1.6 mmol/L (ref 0.4–2.0)

## 2023-11-11 LAB — CREATINE KINASE (CK), TOTAL, SERUM OR PLASMA: CREATINE KINASE: 230 U/L — ABNORMAL HIGH (ref 26–192)

## 2023-11-11 LAB — PT/INR
INR: 1.17 — ABNORMAL HIGH (ref 0.84–1.10)
PROTHROMBIN TIME: 13.2 s — ABNORMAL HIGH (ref 9.8–12.7)

## 2023-11-11 LAB — LIPASE: LIPASE: 30 U/L (ref 16–77)

## 2023-11-11 LAB — MAGNESIUM: MAGNESIUM: 1.3 mg/dL — ABNORMAL LOW (ref 1.8–2.4)

## 2023-11-11 MED ORDER — ONDANSETRON 4 MG DISINTEGRATING TABLET
4.0000 mg | ORAL_TABLET | Freq: Three times a day (TID) | ORAL | 0 refills | Status: AC | PRN
Start: 2023-11-11 — End: ?

## 2023-11-11 MED ORDER — POTASSIUM CHLORIDE 20 MEQ/100ML IN STERILE WATER INTRAVENOUS PIGGYBACK
INJECTION | INTRAVENOUS | Status: AC
Start: 2023-11-11 — End: 2023-11-11
  Filled 2023-11-11: qty 100

## 2023-11-11 MED ORDER — ONDANSETRON HCL (PF) 4 MG/2 ML INJECTION SOLUTION
INTRAMUSCULAR | Status: AC
Start: 2023-11-11 — End: 2023-11-11
  Filled 2023-11-11: qty 2

## 2023-11-11 MED ORDER — MAGNESIUM SULFATE 1 GRAM/100 ML IN DEXTROSE 5 % INTRAVENOUS PIGGYBACK
INJECTION | INTRAVENOUS | Status: AC
Start: 2023-11-11 — End: 2023-11-11
  Filled 2023-11-11: qty 400

## 2023-11-11 MED ORDER — SODIUM CHLORIDE 0.9 % INTRAVENOUS SOLUTION
2.0000 g | INTRAVENOUS | Status: AC
Start: 2023-11-11 — End: 2023-11-11
  Administered 2023-11-11: 0 g via INTRAVENOUS
  Administered 2023-11-11: 2 g via INTRAVENOUS

## 2023-11-11 MED ORDER — MAGNESIUM SULFATE 1 GRAM/100 ML IN DEXTROSE 5 % INTRAVENOUS PIGGYBACK
1.0000 g | INJECTION | Freq: Once | INTRAVENOUS | Status: AC
Start: 2023-11-11 — End: 2023-11-11
  Administered 2023-11-11: 0 g via INTRAVENOUS
  Administered 2023-11-11: 1 g via INTRAVENOUS

## 2023-11-11 MED ORDER — LACTATED RINGERS IV BOLUS
1000.0000 mL | INJECTION | Status: AC
Start: 2023-11-11 — End: 2023-11-11
  Administered 2023-11-11: 1000 mL via INTRAVENOUS
  Administered 2023-11-11: 0 mL via INTRAVENOUS

## 2023-11-11 MED ORDER — DIPHENOXYLATE-ATROPINE 2.5 MG-0.025 MG TABLET
ORAL_TABLET | ORAL | Status: AC
Start: 2023-11-11 — End: 2023-11-11
  Filled 2023-11-11: qty 1

## 2023-11-11 MED ORDER — CEFTRIAXONE 2 GRAM SOLUTION FOR INJECTION
INTRAMUSCULAR | Status: AC
Start: 2023-11-11 — End: 2023-11-11
  Filled 2023-11-11: qty 20

## 2023-11-11 MED ORDER — PANTOPRAZOLE 40 MG INTRAVENOUS SOLUTION
INTRAVENOUS | Status: AC
Start: 2023-11-11 — End: 2023-11-11
  Filled 2023-11-11: qty 10

## 2023-11-11 MED ORDER — MAGNESIUM SULFATE 1 GRAM/100 ML IN DEXTROSE 5 % INTRAVENOUS PIGGYBACK
1.0000 g | INJECTION | Freq: Once | INTRAVENOUS | Status: AC
Start: 2023-11-11 — End: 2023-11-11
  Administered 2023-11-11: 1 g via INTRAVENOUS
  Administered 2023-11-11: 0 g via INTRAVENOUS

## 2023-11-11 MED ORDER — SODIUM CHLORIDE 0.9 % INTRAVENOUS SOLUTION
INTRAVENOUS | Status: AC
Start: 2023-11-11 — End: 2023-11-11
  Filled 2023-11-11: qty 50

## 2023-11-11 MED ORDER — DIPHENOXYLATE-ATROPINE 2.5 MG-0.025 MG TABLET
2.0000 | ORAL_TABLET | ORAL | Status: AC
Start: 2023-11-11 — End: 2023-11-11
  Administered 2023-11-11: 2 via ORAL

## 2023-11-11 MED ORDER — ONDANSETRON HCL (PF) 4 MG/2 ML INJECTION SOLUTION
4.0000 mg | INTRAMUSCULAR | Status: AC
Start: 2023-11-11 — End: 2023-11-11
  Administered 2023-11-11: 4 mg via INTRAVENOUS

## 2023-11-11 MED ORDER — PANTOPRAZOLE 40 MG INTRAVENOUS SOLUTION
40.0000 mg | INTRAVENOUS | Status: AC
Start: 2023-11-11 — End: 2023-11-11
  Administered 2023-11-11: 40 mg via INTRAVENOUS

## 2023-11-11 MED ORDER — POTASSIUM CHLORIDE 20 MEQ/100ML IN STERILE WATER INTRAVENOUS PIGGYBACK
20.0000 meq | INJECTION | INTRAVENOUS | Status: AC
Start: 2023-11-11 — End: 2023-11-11
  Administered 2023-11-11: 0 meq via INTRAVENOUS
  Administered 2023-11-11: 20 meq via INTRAVENOUS

## 2023-11-11 MED ORDER — NITROFURANTOIN MONOHYDRATE/MACROCRYSTALS 100 MG CAPSULE
100.0000 mg | ORAL_CAPSULE | Freq: Two times a day (BID) | ORAL | 0 refills | Status: AC
Start: 2023-11-11 — End: 2023-11-18

## 2023-11-11 NOTE — ED Nurses Note (Signed)
 Up to bathroom gait steady  has had no vomiting or diarrhea since in ER

## 2023-11-11 NOTE — ED Attending Handoff Note (Signed)
 [facility] - Emergency Department  Emergency Department  Course Note    Care/report received from Dr. Rexford att. providers found at  07:39.  Per report:  Brooke Maxwell is a 73 y.o. female who had concerns including Vomiting.  Patient presents with vomiting also nausea and diarrhea for the past 4 days.  Has had body aches and cramping all over.  Patient states she is unable to keep anything down including water  over the past 6-8 hours.  She is complaining of diffuse abdominal cramping.  Patient states she has also been urinating frequently.  She denies any hematuria.  She denies any dysuria.  She is complaining of generalized weakness and light-headedness when she stands.    Pending labs/imaging/consults:  CAT scan abdomen pelvis shows increased stranding of the antrum and proximal duodenum consistent with gastro enteritis.  CBC is totally normal.  Patient's potassium is 2.7.  Plan:  Plan is to hydrate the patient.  She is also awaiting the rest of her labs.  Patient will receive potassium as supplement to get her potassium level up.  She also received Imodium  to stop some of the diarrhea.    Course:  Patient thus far has not had any nausea vomiting or diarrhea here in the ED.     Following the history, physical exam, and ED workup, the patient was deemed stable and suitable for discharge. The patient/caregiver was advised to return to the ED for any new or worsening symptoms. Discharge medications, and follow-up instructions were discussed with the patient/caregiver in detail, who verbalizes understanding. The patient/caregiver is in agreement and is comfortable with the plan of care.    Disposition: Discharged         Current Discharge Medication List        START taking these medications.        Details   nitrofurantoin  monohyd/m-cryst 100 mg Capsule  Commonly known as: Macrobid    100 mg, Oral, 2 TIMES DAILY  Qty: 14 Capsule  Refills: 0            CONTINUE these medications which have  CHANGED during your visit.        Details   ondansetron  4 mg Tablet, Rapid Dissolve  Commonly known as: ZOFRAN  ODT  What changed: Another medication with the same name was removed. Continue taking this medication, and follow the directions you see here.   4 mg, Oral, EVERY 8 HOURS PRN  Qty: 12 Tablet  Refills: 0            CONTINUE these medications - NO CHANGES were made during your visit.        Details   ARIPiprazole  30 mg Tablet  Commonly known as: ABILIFY    1 Tablet, Oral, Daily  Refills: 0     cyclobenzaprine  10 mg Tablet  Commonly known as: FLEXERIL    10 mg, Oral, 3 TIMES DAILY PRN  Qty: 12 Tablet  Refills: 0     dicyclomine  10 mg Capsule  Commonly known as: BENTYL    10 mg, Oral, 4 TIMES DAILY  Qty: 40 Capsule  Refills: 0     ketorolac  tromethamine  10 mg Tablet  Commonly known as: TORADOL    10 mg, Oral, EVERY 6 HOURS PRN  Qty: 20 Tablet  Refills: 0     lisinopriL  20 mg Tablet  Commonly known as: PRINIVIL    20 mg, Oral, Daily  Qty: 30 Tablet  Refills: 0     TraZODone  300 mg Tablet  Commonly  known as: DESYREL    600 mg, NIGHTLY  Refills: 0            ASK your doctor about these medications.        Details   famotidine  40 mg Tablet  Commonly known as: PEPCID   Ask about: Should I take this medication?   40 mg, Oral, 2 TIMES DAILY  Qty: 60 Tablet  Refills: 0     pantoprazole  40 mg Tablet, Delayed Release (E.C.)  Commonly known as: PROTONIX   Ask about: Should I take this medication?   40 mg, Oral, 2 TIMES DAILY  Qty: 60 Tablet  Refills: 0            Follow up:   Chesley Million, DO  [address]  [address]  [place] [address]  [phone]    Schedule an appointment as soon as possible for a visit in 3 days  If symptoms worsen      Clinical Impression   Gastroenteritis (Primary)   Dehydration   Hypokalemia   Hypomagnesemia   Nausea vomiting and diarrhea   Acute cystitis without hematuria         Oneil GORMAN Falls, MD

## 2023-11-11 NOTE — ED Provider Notes (Signed)
 [facility] emergency department         HISTORY OF PRESENT ILLNESS     Date:  11/11/2023  Patient's Name:  Brooke Maxwell  Date of Birth:  Aug 28, 1950    Patient is a 73 year old female presenting with vomiting nausea and diarrhea for the past 4 days.  Patient states she has had body aches and cramping all over.  Patient states she has been unable to keep anything down including water  over the last 6-8 hours.  She has had diffuse abdominal cramping.  She also complains of lower back pain and was concerned that she may have a urinary tract infection.  She denied any hematuria.  She states no one else at home is sick with diarrhea and vomiting.        Review of Systems     Review of Systems   Constitutional: Negative.    HENT: Negative.     Eyes: Negative.    Respiratory: Negative.     Cardiovascular: Negative.    Gastrointestinal:  Positive for diarrhea, nausea and vomiting.   Genitourinary: Negative.    Musculoskeletal: Negative.    Neurological:  Positive for weakness and light-headedness.   Psychiatric/Behavioral: Negative.     All other systems reviewed and are negative.      Previous History     Past Medical History:  Past Medical History:   Diagnosis Date    Arthropathy     Bipolar disorder, unspecified     Depression     Kidney stone     PUD (peptic ulcer disease)     Wears dentures        Past Surgical History:  Past Surgical History:   Procedure Laterality Date    Abdominal hernia repair      Abdominal hysterectomy      Ankle surgery Right     Colectomy partial / total      Hx breast biopsy Left     Hx carpal tunnel release Bilateral     Hx cyst removal      Lithotripsy         Social History:  Social History[1]  Social History     Substance and Sexual Activity   Drug Use Never       Family History:  No family history on file.    Medication History:  Current Outpatient Medications   Medication Sig    ARIPiprazole  (ABILIFY ) 30 mg Oral Tablet Take 1 Tablet (30 mg total)  by mouth Once a day    cyclobenzaprine  (FLEXERIL ) 10 mg Oral Tablet Take 1 Tablet (10 mg total) by mouth Three times a day as needed for Muscle spasms    dicyclomine  (BENTYL ) 10 mg Oral Capsule Take 1 Capsule (10 mg total) by mouth Four times a day    ketorolac  tromethamine  (TORADOL ) 10 mg Oral Tablet Take 1 Tablet (10 mg total) by mouth Every 6 hours as needed for Pain    lisinopriL  (PRINIVIL ) 20 mg Oral Tablet Take 1 Tablet (20 mg total) by mouth Once a day    ondansetron  (ZOFRAN  ODT) 4 mg Oral Tablet, Rapid Dissolve Take 1 Tablet (4 mg total) by mouth Every 8 hours as needed for Nausea/Vomiting    ondansetron  (ZOFRAN  ODT) 4 mg Oral Tablet, Rapid Dissolve Take 1 Tablet (4 mg total) by mouth Every 8 hours as needed for Nausea/Vomiting    TraZODone  (DESYREL ) 300 mg Oral Tablet Take 2 Tablets (600 mg total)  by mouth Every night (Patient not taking: Reported on 07/27/2022)       Allergies:  Allergies[2]    Physical Exam     Vitals:    BP (!) 155/106   Pulse (!) 116   Temp 36 C (96.8 F)   Resp 20   Ht 1.575 m (5' 2)   Wt 90.3 kg (199 lb)   SpO2 96%   BMI 36.40 kg/m           Physical Exam    Diagnostic Studies/Treatment     Medications:  Medications Ordered/Administered in the ED   LR bolus infusion 1,000 mL (has no administration in time range)   ondansetron  (ZOFRAN ) 2 mg/mL injection (has no administration in time range)   pantoprazole  (PROTONIX ) 4 mg/mL injection (has no administration in time range)   diphenoxylate -atropine  (LOMOTIL ) 2.5-0.025 mg per tablet (has no administration in time range)       New Prescriptions    No medications on file       Labs:    No results found for this or any previous visit (from the past 12 hours).     Radiology:  CT ABDOMEN PELVIS WO IV CONTRAST    No orders to display       ECG:  NONE            Differential diagnosis  Gastroenteritis, nausea and vomiting, dehydration, abdominal pain    Course/Disposition/Plan     Course:    Patient will have baseline labs obtained  patient will receive IV fluids antiemetics.  Will obtain CT abdomen pelvis to rule out small-bowel obstruction colitis    Disposition:    Data Unavailable    Condition at Disposition:   Stable    Follow up:   No follow-up provider specified.    Clinical Impression:     Clinical Impression   Gastroenteritis (Primary)   Dehydration         Jesslyn Ponto, MD        [1]   Social History  Tobacco Use    Smoking status: Never    Smokeless tobacco: Never   Vaping Use    Vaping status: Never Used   Substance Use Topics    Alcohol  use: Never    Drug use: Never   [2]   Allergies  Allergen Reactions    Cefaclor Anaphylaxis    Citalopram  Mental Status Effect and Itching

## 2023-11-13 ENCOUNTER — Ambulatory Visit (HOSPITAL_BASED_OUTPATIENT_CLINIC_OR_DEPARTMENT_OTHER): Payer: Self-pay | Admitting: Family

## 2023-11-13 LAB — URINE CULTURE,ROUTINE: URINE CULTURE: 10000 — AB

## 2023-11-16 ENCOUNTER — Emergency Department (HOSPITAL_BASED_OUTPATIENT_CLINIC_OR_DEPARTMENT_OTHER)

## 2023-11-16 ENCOUNTER — Emergency Department
Admission: EM | Admit: 2023-11-16 | Discharge: 2023-11-16 | Disposition: A | Discharge status: Home / Self Care | Source: Home / Self Care | Attending: FAMILY PRACTICE | Admitting: FAMILY PRACTICE

## 2023-11-16 ENCOUNTER — Other Ambulatory Visit: Payer: Self-pay

## 2023-11-16 ENCOUNTER — Encounter (HOSPITAL_BASED_OUTPATIENT_CLINIC_OR_DEPARTMENT_OTHER): Payer: Self-pay

## 2023-11-16 DIAGNOSIS — Z791 Long term (current) use of non-steroidal anti-inflammatories (NSAID): Secondary | ICD-10-CM | POA: Insufficient documentation

## 2023-11-16 DIAGNOSIS — Z8711 Personal history of peptic ulcer disease: Secondary | ICD-10-CM | POA: Insufficient documentation

## 2023-11-16 DIAGNOSIS — R112 Nausea with vomiting, unspecified: Secondary | ICD-10-CM | POA: Insufficient documentation

## 2023-11-16 DIAGNOSIS — Z87440 Personal history of urinary (tract) infections: Secondary | ICD-10-CM | POA: Insufficient documentation

## 2023-11-16 DIAGNOSIS — Z8719 Personal history of other diseases of the digestive system: Secondary | ICD-10-CM | POA: Insufficient documentation

## 2023-11-16 DIAGNOSIS — R1114 Bilious vomiting: Secondary | ICD-10-CM | POA: Insufficient documentation

## 2023-11-16 DIAGNOSIS — F1990 Other psychoactive substance use, unspecified, uncomplicated: Secondary | ICD-10-CM

## 2023-11-16 LAB — CBC WITH DIFF
BASOPHIL #: 0.02 x10ˆ3/uL (ref 0.00–0.10)
BASOPHIL %: 0 % (ref 0–1)
EOSINOPHIL #: 0.37 x10ˆ3/uL (ref 0.00–0.50)
EOSINOPHIL %: 3 % (ref 1–7)
HCT: 41.2 % (ref 31.2–41.9)
HGB: 13.8 g/dL (ref 10.9–14.3)
LYMPHOCYTE #: 1.67 x10ˆ3/uL (ref 1.10–3.10)
LYMPHOCYTE %: 15 % — ABNORMAL LOW (ref 16–46)
MCH: 32 pg (ref 24.7–32.8)
MCHC: 33.5 g/dL (ref 32.3–35.6)
MCV: 95.4 fL — ABNORMAL HIGH (ref 75.5–95.3)
MONOCYTE #: 0.91 x10ˆ3/uL — ABNORMAL HIGH (ref 0.20–0.90)
MONOCYTE %: 8 % (ref 4–11)
MPV: 7.1 fL — ABNORMAL LOW (ref 7.9–10.8)
NEUTROPHIL #: 8.39 x10ˆ3/uL — ABNORMAL HIGH (ref 1.90–8.20)
NEUTROPHIL %: 74 % (ref 43–77)
PLATELETS: 317 x10ˆ3/uL (ref 140–440)
RBC: 4.32 x10ˆ6/uL (ref 3.63–4.92)
RDW: 16.4 % (ref 12.3–17.7)
WBC: 11.4 x10ˆ3/uL (ref 3.8–11.8)

## 2023-11-16 LAB — URINALYSIS, MACRO/MICRO
BLOOD: NEGATIVE mg/dL
GLUCOSE: NEGATIVE mg/dL
LEUKOCYTES: NEGATIVE WBCs/uL
NITRITE: NEGATIVE
PH: 7 (ref 4.6–8.0)
SPECIFIC GRAVITY: 1.015 (ref 1.003–1.035)
UROBILINOGEN: 0.2 mg/dL (ref 0.2–1.0)

## 2023-11-16 LAB — COMPREHENSIVE METABOLIC PANEL, NON-FASTING
ALBUMIN/GLOBULIN RATIO: 0.9 (ref 0.8–1.4)
ALBUMIN: 3.9 g/dL (ref 3.4–5.0)
ALKALINE PHOSPHATASE: 88 U/L (ref 46–116)
ALT (SGPT): 20 U/L (ref <=78)
ANION GAP: 14 mmol/L — ABNORMAL HIGH (ref 4–13)
AST (SGOT): 37 U/L (ref 15–37)
BILIRUBIN TOTAL: 0.7 mg/dL (ref 0.2–1.0)
BUN/CREA RATIO: 12
BUN: 12 mg/dL (ref 7–18)
CALCIUM, CORRECTED: 9.7 mg/dL
CALCIUM: 9.6 mg/dL (ref 8.5–10.1)
CHLORIDE: 98 mmol/L (ref 98–107)
CO2 TOTAL: 25 mmol/L (ref 21–32)
CREATININE: 1.01 mg/dL (ref 0.55–1.02)
ESTIMATED GFR: 60 mL/min/1.73mˆ2 (ref >59)
GLOBULIN: 4.4
GLUCOSE: 103 mg/dL (ref 74–106)
OSMOLALITY, CALCULATED: 274 mosm/kg (ref 270–290)
POTASSIUM: 4.1 mmol/L (ref 3.5–5.1)
PROTEIN TOTAL: 8.3 g/dL — ABNORMAL HIGH (ref 6.4–8.2)
SODIUM: 137 mmol/L (ref 136–145)

## 2023-11-16 LAB — MAGNESIUM: MAGNESIUM: 1.5 mg/dL — ABNORMAL LOW (ref 1.8–2.4)

## 2023-11-16 LAB — URINALYSIS, MICROSCOPIC: HYALINE CASTS: 1 /LPF — ABNORMAL HIGH (ref <0)

## 2023-11-16 LAB — LACTIC ACID - FIRST REFLEX: LACTIC ACID: 1.6 mmol/L (ref 0.4–2.0)

## 2023-11-16 LAB — LACTIC ACID LEVEL W/ REFLEX FOR LEVEL >2.0: LACTIC ACID: 2.2 mmol/L — ABNORMAL HIGH (ref 0.4–2.0)

## 2023-11-16 MED ORDER — LACTATED RINGERS IV BOLUS
1000.0000 mL | INJECTION | Status: AC
Start: 2023-11-16 — End: 2023-11-16
  Administered 2023-11-16: 0 mL via INTRAVENOUS
  Administered 2023-11-16: 1000 mL via INTRAVENOUS

## 2023-11-16 MED ORDER — MAGNESIUM SULFATE 1 GRAM/100 ML IN DEXTROSE 5 % INTRAVENOUS PIGGYBACK
1.0000 g | INJECTION | INTRAVENOUS | Status: AC
Start: 2023-11-16 — End: 2023-11-16
  Administered 2023-11-16: 0 g via INTRAVENOUS
  Administered 2023-11-16 (×2): 1 g via INTRAVENOUS
  Administered 2023-11-16: 0 g via INTRAVENOUS

## 2023-11-16 MED ORDER — MAGNESIUM SULFATE 1 GRAM/100 ML IN DEXTROSE 5 % INTRAVENOUS PIGGYBACK
INJECTION | INTRAVENOUS | Status: AC
Start: 2023-11-16 — End: 2023-11-16
  Filled 2023-11-16: qty 100

## 2023-11-16 MED ORDER — SODIUM CHLORIDE 0.9 % (FLUSH) INJECTION SYRINGE
3.0000 mL | INJECTION | INTRAMUSCULAR | Status: DC | PRN
Start: 2023-11-16 — End: 2023-11-16

## 2023-11-16 MED ORDER — SODIUM CHLORIDE 0.9 % (FLUSH) INJECTION SYRINGE
3.0000 mL | INJECTION | Freq: Three times a day (TID) | INTRAMUSCULAR | Status: DC
Start: 2023-11-16 — End: 2023-11-16

## 2023-11-16 NOTE — ED Provider Notes (Signed)
 [facility], [place] - Emergency Department  ED Primary Note  History of Present Illness   Brooke Maxwell is a 73 y.o. female who had concerns including Vomiting.  This 73 year old female patient presents emergency department with nausea and vomiting of even water , and mild shortness of breath that started yesterday.  She is also complaining of some dizziness with positional changes at time as well, but no fevers, sweats, chills.  She denies any diarrhea.  She was recently placed on Augmentin for her urinary tract infection on 23 July but she states he is on an antibiotic for a urinary tract infection currently, and I can not locate it.  We are trying to get a pharmacy printout for confirmation of her medications.  This is the 3rd visit sensory 23 July with nausea and vomiting and what he states are urinary tract infections.    Past medical history:  Depression, bipolar disorder, kidney stones, peptic ulcer disease, arthropathy.    Past surgical history: Abdominal hysterectomy, left breast biopsy, abdominal wall herniorrhaphy, bilateral carpal tunnel release, partial colectomy and Ree anastomosis, lithotripsy, right ankle surgery.  Social: She has never smoked or vaped, never used alcohol , marijuana or illicit drugs.    Physical Exam   ED Triage Vitals [11/16/23 0924]   BP (Non-Invasive) (!) 120/90   Heart Rate 90   Respiratory Rate 17   Temperature 36.1 C (97 F)   SpO2 100 %   Weight 82.6 kg (182 lb)   Height 1.575 m (5' 2)     Physical Exam  General: No acute distress nontoxic.  Ears: TMs are clear without fluid or retraction   Nasal: Pink and moist, patent bilaterally   Oral: Slightly dry oral mucosa   Pharynx: Slightly dry, pink no PND, exudate, petechiae   Neck: Supple   Lungs: Clear and symmetrical with good aeration   Heart: Regular rate rhythm without murmur   Abdomen:  Obese, soft, normal bowel sounds, very mild generalized tenderness without guarding, rebound, or referred pain.     Extremities: Moving symmetrically   Skin: No suspicious rashes, lesions, edema.      Patient Data   Labs Ordered/Reviewed   COMPREHENSIVE METABOLIC PANEL, NON-FASTING - Abnormal; Notable for the following components:       Result Value    ANION GAP 14 (*)     PROTEIN TOTAL 8.3 (*)     All other components within normal limits    Narrative:     Estimated Glomerular Filtration Rate (eGFR) is calculated using the CKD-EPI (2021) equation, intended for patients 38 years of age and older. If gender is not documented or unknown, there will be no eGFR calculation.   MAGNESIUM  - Abnormal; Notable for the following components:    MAGNESIUM  1.5 (*)     All other components within normal limits   CBC WITH DIFF - Abnormal; Notable for the following components:    MCV 95.4 (*)     MPV 7.1 (*)     LYMPHOCYTE % 15 (*)     NEUTROPHIL # 8.39 (*)     MONOCYTE # 0.91 (*)     All other components within normal limits   URINALYSIS, MACRO/MICRO - Abnormal; Notable for the following components:    APPEARANCE Slightly Cloudy (*)     PROTEIN Trace (*)     KETONES Trace (*)     BILIRUBIN Small (*)     All other components within normal limits   LACTIC ACID LEVEL  W/ REFLEX FOR LEVEL >2.0 - Abnormal; Notable for the following components:    LACTIC ACID 2.2 (*)     All other components within normal limits   URINALYSIS, MICROSCOPIC - Abnormal; Notable for the following components:    BACTERIA Few (*)     HYALINE CASTS 1 (*)     All other components within normal limits   LACTIC ACID - FIRST REFLEX - Normal   CBC/DIFF    Narrative:     The following orders were created for panel order CBC/DIFF.  Procedure                               Abnormality         Status                     ---------                               -----------         ------                     CBC WITH IPQQ[[id number]]                Abnormal            Final result                 Please view results for these tests on the individual orders.   URINALYSIS WITH REFLEX  MICROSCOPIC AND CULTURE IF POSITIVE    Narrative:     The following orders were created for panel order URINALYSIS WITH REFLEX MICROSCOPIC AND CULTURE IF POSITIVE.  Procedure                               Abnormality         Status                     ---------                               -----------         ------                     URINALYSIS, MACRO/MICRO[[id number]]      Abnormal            Final result               URINALYSIS, MICROSCOPIC[[id number]]      Abnormal            Final result                 Please view results for these tests on the individual orders.     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  With the nausea and vomiting her renal function was  good, white count slightly elevated, neutrophilic shift, lipase was normal, lactic acid was 2.2, was given IV fluids and her magnesium  at 1.5 was replaced with 2 g of magnesium  intravenously.  Juice, crackers initiated and dispositioned.  Was recommended that she has follow up with her primary care provider, and with Dr. Tobie, her gastroenterologist for interventions regarding her stomach and to stop the ibuprofen 800 mg and she is to discontinue all ketorolac  and not take that medication again due to her age and risk factors for kidney failure adult ulcers.    Amount and/or Complexity of Data Reviewed  Labs: ordered.     Details: Labs reviewed and noted, magnesium  replaced    Risk  Prescription drug management.                Medications Ordered/Administered in the ED   NS flush syringe (has no administration in time range)   NS flush syringe (has no administration in time range)   LR bolus infusion 1,000 mL (0 mL  Intravenous Stopped 11/16/23 1031)   magnesium  sulfate 1 G in D5W 100 mL premix IVPB (1 g Intravenous New Bag/New Syringe 11/16/23 1311)     Clinical Impression   Bilious vomiting with nausea (Primary)   Hypomagnesemia   Excessive use of nonsteroidal anti-inflammatory drugs (NSAIDs)       Disposition: Discharged

## 2023-11-16 NOTE — ED Nurses Note (Signed)
 Patient ate crackers and drank water  and kept down.  Patient states she is feeling better.

## 2023-11-16 NOTE — Discharge Instructions (Signed)
 With your Motrin use that needs to stop, you may take 400 mg of ibuprofen up to 3 times a day for 3 days and you must stop for more than a week.  The safest medication that you can use for pain at age over 5 would be Tylenol .  Those for Tylenol , generic name acetaminophen , he is 650 mg every 6 hours if needed for pain.  This is the little around 325 mg tablets that were use a few years ago and commonly in use and it would be to take 2 of those tablets every 6 hours if needed for pain.  Use a bland diet for a week, continue your Protonix  every day and Zofran  if needed for nausea and vomiting.    You not take ibuprofen extensively, and throw the Toradol  or ketorolac  pills away and do not ever take a ketorolac  again.    Continue other home medications as prescribed, call follow up with your primary care provider and Dr. Tobie since you have had recurrent episodes of nausea and vomiting.

## 2023-11-16 NOTE — ED Nurses Note (Signed)

## 2023-11-17 ENCOUNTER — Emergency Department (HOSPITAL_BASED_OUTPATIENT_CLINIC_OR_DEPARTMENT_OTHER)

## 2023-11-17 ENCOUNTER — Other Ambulatory Visit: Payer: Self-pay

## 2023-11-17 ENCOUNTER — Emergency Department
Admission: EM | Admit: 2023-11-17 | Discharge: 2023-11-17 | Disposition: A | Discharge status: Home / Self Care | Attending: FAMILY PRACTICE | Admitting: FAMILY PRACTICE

## 2023-11-17 ENCOUNTER — Encounter (HOSPITAL_BASED_OUTPATIENT_CLINIC_OR_DEPARTMENT_OTHER): Payer: Self-pay

## 2023-11-17 DIAGNOSIS — J9811 Atelectasis: Secondary | ICD-10-CM | POA: Insufficient documentation

## 2023-11-17 DIAGNOSIS — R112 Nausea with vomiting, unspecified: Secondary | ICD-10-CM | POA: Insufficient documentation

## 2023-11-17 DIAGNOSIS — R1010 Upper abdominal pain, unspecified: Secondary | ICD-10-CM | POA: Insufficient documentation

## 2023-11-17 DIAGNOSIS — R1013 Epigastric pain: Secondary | ICD-10-CM

## 2023-11-17 DIAGNOSIS — R1114 Bilious vomiting: Secondary | ICD-10-CM

## 2023-11-17 DIAGNOSIS — F558 Abuse of other non-psychoactive substances: Secondary | ICD-10-CM | POA: Insufficient documentation

## 2023-11-17 DIAGNOSIS — F1990 Other psychoactive substance use, unspecified, uncomplicated: Secondary | ICD-10-CM

## 2023-11-17 DIAGNOSIS — K2980 Duodenitis without bleeding: Secondary | ICD-10-CM | POA: Insufficient documentation

## 2023-11-17 DIAGNOSIS — K2950 Unspecified chronic gastritis without bleeding: Secondary | ICD-10-CM | POA: Insufficient documentation

## 2023-11-17 DIAGNOSIS — R109 Unspecified abdominal pain: Secondary | ICD-10-CM

## 2023-11-17 LAB — COMPREHENSIVE METABOLIC PANEL, NON-FASTING
ALBUMIN/GLOBULIN RATIO: 0.9 (ref 0.8–1.4)
ALBUMIN: 4.1 g/dL (ref 3.4–5.0)
ALKALINE PHOSPHATASE: 93 U/L (ref 46–116)
ALT (SGPT): 27 U/L (ref <=78)
ANION GAP: 17 mmol/L — ABNORMAL HIGH (ref 4–13)
AST (SGOT): 25 U/L (ref 15–37)
BILIRUBIN TOTAL: 0.7 mg/dL (ref 0.2–1.0)
BUN/CREA RATIO: 9
BUN: 9 mg/dL (ref 7–18)
CALCIUM, CORRECTED: 9.6 mg/dL
CALCIUM: 9.7 mg/dL (ref 8.5–10.1)
CHLORIDE: 98 mmol/L (ref 98–107)
CO2 TOTAL: 24 mmol/L (ref 21–32)
CREATININE: 1.01 mg/dL (ref 0.55–1.02)
ESTIMATED GFR: 60 mL/min/1.73mˆ2 (ref >59)
GLOBULIN: 4.5
GLUCOSE: 105 mg/dL (ref 74–106)
OSMOLALITY, CALCULATED: 277 mosm/kg (ref 270–290)
POTASSIUM: 3.1 mmol/L — ABNORMAL LOW (ref 3.5–5.1)
PROTEIN TOTAL: 8.6 g/dL — ABNORMAL HIGH (ref 6.4–8.2)
SODIUM: 139 mmol/L (ref 136–145)

## 2023-11-17 LAB — LACTIC ACID LEVEL W/ REFLEX FOR LEVEL >2.0: LACTIC ACID: 2.1 mmol/L — ABNORMAL HIGH (ref 0.4–2.0)

## 2023-11-17 LAB — LACTIC ACID - FIRST REFLEX: LACTIC ACID: 1.8 mmol/L (ref 0.4–2.0)

## 2023-11-17 LAB — URINALYSIS, MACRO/MICRO
BILIRUBIN: NEGATIVE mg/dL
GLUCOSE: NEGATIVE mg/dL
KETONES: NEGATIVE mg/dL
NITRITE: NEGATIVE
PH: 7 (ref 4.6–8.0)
PROTEIN: NEGATIVE mg/dL
SPECIFIC GRAVITY: <=1.005 (ref 1.003–1.035)
UROBILINOGEN: 0.2 mg/dL (ref 0.2–1.0)

## 2023-11-17 LAB — LIPASE: LIPASE: 39 U/L (ref 16–77)

## 2023-11-17 LAB — CBC WITH DIFF
BASOPHIL #: 0.03 x10ˆ3/uL (ref 0.00–0.10)
BASOPHIL %: 0 % (ref 0–1)
EOSINOPHIL #: 0.14 x10ˆ3/uL (ref 0.00–0.50)
EOSINOPHIL %: 2 % (ref 1–7)
HCT: 41.4 % (ref 31.2–41.9)
HGB: 13.9 g/dL (ref 10.9–14.3)
LYMPHOCYTE #: 1.45 x10ˆ3/uL (ref 1.10–3.10)
LYMPHOCYTE %: 17 % (ref 16–46)
MCH: 32 pg (ref 24.7–32.8)
MCHC: 33.5 g/dL (ref 32.3–35.6)
MCV: 95.7 fL — ABNORMAL HIGH (ref 75.5–95.3)
MONOCYTE #: 0.69 x10ˆ3/uL (ref 0.20–0.90)
MONOCYTE %: 8 % (ref 4–11)
MPV: 6.6 fL — ABNORMAL LOW (ref 7.9–10.8)
NEUTROPHIL #: 6.35 x10ˆ3/uL (ref 1.90–8.20)
NEUTROPHIL %: 73 % (ref 43–77)
PLATELETS: 302 x10ˆ3/uL (ref 140–440)
RBC: 4.33 x10ˆ6/uL (ref 3.63–4.92)
RDW: 15.9 % (ref 12.3–17.7)
WBC: 8.7 x10ˆ3/uL (ref 3.8–11.8)

## 2023-11-17 LAB — OCCULT BLOOD, STOOL: OCCULT BLOOD: POSITIVE — AB

## 2023-11-17 LAB — URINALYSIS, MICROSCOPIC

## 2023-11-17 LAB — MAGNESIUM: MAGNESIUM: 1.7 mg/dL — ABNORMAL LOW (ref 1.8–2.4)

## 2023-11-17 MED ORDER — ACETAMINOPHEN 1,000 MG/100 ML (10 MG/ML) INTRAVENOUS SOLUTION
1000.0000 mg | Freq: Four times a day (QID) | INTRAVENOUS | Status: DC | PRN
Start: 2023-11-17 — End: 2023-11-17

## 2023-11-17 MED ORDER — LACTATED RINGERS IV BOLUS
1500.0000 mL | INJECTION | Status: AC
Start: 2023-11-17 — End: 2023-11-17
  Administered 2023-11-17: 1500 mL via INTRAVENOUS
  Administered 2023-11-17: 0 mL via INTRAVENOUS

## 2023-11-17 MED ORDER — ACETAMINOPHEN 1,000 MG/100 ML (10 MG/ML) INTRAVENOUS SOLUTION
INTRAVENOUS | Status: AC
Start: 2023-11-17 — End: 2023-11-17
  Filled 2023-11-17: qty 100

## 2023-11-17 MED ORDER — BUPRENORPHINE HCL 0.3 MG/ML INJECTION SOLUTION
0.1500 mg | INTRAMUSCULAR | Status: AC
Start: 2023-11-17 — End: 2023-11-17
  Administered 2023-11-17: 0.15 mg via INTRAMUSCULAR

## 2023-11-17 MED ORDER — MAGNESIUM SULFATE 1 GRAM/100 ML IN DEXTROSE 5 % INTRAVENOUS PIGGYBACK
1.0000 g | INJECTION | Freq: Once | INTRAVENOUS | Status: DC
Start: 2023-11-17 — End: 2023-11-17

## 2023-11-17 MED ORDER — BUPRENORPHINE HCL 0.3 MG/ML INJECTION SOLUTION
INTRAMUSCULAR | Status: AC
Start: 2023-11-17 — End: 2023-11-17
  Filled 2023-11-17: qty 1

## 2023-11-17 MED ORDER — DIATRIZOATE MEGLUMINE-DIATRIZOATE SODIUM 66 %-10 % ORAL SOLUTION
30.0000 mL | ORAL | Status: AC
Start: 2023-11-17 — End: 2023-11-17
  Administered 2023-11-17: 30 mL via ORAL

## 2023-11-17 MED ORDER — ONDANSETRON HCL (PF) 4 MG/2 ML INJECTION SOLUTION
INTRAMUSCULAR | Status: AC
Start: 2023-11-17 — End: 2023-11-17
  Filled 2023-11-17: qty 2

## 2023-11-17 MED ORDER — ONDANSETRON HCL (PF) 4 MG/2 ML INJECTION SOLUTION
4.0000 mg | INTRAMUSCULAR | Status: AC
Start: 2023-11-17 — End: 2023-11-17
  Administered 2023-11-17: 4 mg via INTRAVENOUS

## 2023-11-17 MED ORDER — IOPAMIDOL 370 MG IODINE/ML (76 %) INTRAVENOUS SOLUTION
75.0000 mL | INTRAVENOUS | Status: AC
Start: 2023-11-17 — End: 2023-11-17
  Administered 2023-11-17: 75 mL via INTRAVENOUS
  Filled 2023-11-17: qty 100

## 2023-11-17 MED ORDER — MAGNESIUM SULFATE 1 GRAM/100 ML IN DEXTROSE 5 % INTRAVENOUS PIGGYBACK
1.0000 g | INJECTION | Freq: Once | INTRAVENOUS | Status: AC
Start: 2023-11-17 — End: 2023-11-17
  Administered 2023-11-17: 1 g via INTRAVENOUS
  Administered 2023-11-17: 0 g via INTRAVENOUS

## 2023-11-17 MED ORDER — ACETAMINOPHEN 1,000 MG/100 ML (10 MG/ML) INTRAVENOUS SOLUTION
1000.0000 mg | INTRAVENOUS | Status: AC
Start: 2023-11-17 — End: 2023-11-17
  Administered 2023-11-17: 0 mg via INTRAVENOUS
  Administered 2023-11-17: 1000 mg via INTRAVENOUS

## 2023-11-17 MED ORDER — MAGNESIUM SULFATE 1 GRAM/100 ML IN DEXTROSE 5 % INTRAVENOUS PIGGYBACK
INJECTION | INTRAVENOUS | Status: AC
Start: 2023-11-17 — End: 2023-11-17
  Filled 2023-11-17: qty 100

## 2023-11-17 NOTE — ED Provider Notes (Signed)
 [facility], [place] - Emergency Department  ED Primary Note  History of Present Illness   Brooke Maxwell is a 73 y.o. female who had concerns including Abdominal Pain.  This 73 year old female patient presents emergency department again today after being here yesterday with complaints of nausea and vomiting and abdominal pain.  You also seen on 11 August with similar complaints.  Patient appear to have duodenitis and gastritis in the antrum by CT scan on 11 August, was here yesterday with complaints of abdominal pain, generalized with nausea and vomiting but no diarrhea.  I did discover she was taking a lot of ibuprofen, 800 mg tablets 3 and 4 times a day at intervals which I advised her she must stop.  She has Protonix  at home, got Protonix  here and was discharged to home with a bland diet, taking her Protonix  and she said she already had Zofran  that she can take for nausea and vomiting.  This morning she states you did have a small bowel movement but she has not had a regular or normal bowel movement in about 5 days, she is tolerating water , has a very low appetite but states that she has had some vomiting continue since yesterday.  She is a elusive as to whether or not she is taking her Zofran .    Past medical history: Bipolar disorder, depression, kidney stones, arthritis, peptic ulcer disease.    Past surgical history: Abdominal hysterectomy, left breast biopsy, abdominal herniorrhaphy, bilateral carpal tunnel release, lithotripsy, partial colectomy, right ankle surgery and cyst excision.  Social history: She has never smoked, vape, use alcohol , marijuana or illicit drugs.    Vaccine history: She has pneumococcal, Prevnar, and influenza vaccinated.  She has not had the RSV vaccine.  She has had her COVID vaccination and all 5 boosters.  The patient had had COVID once.    Physical Exam   ED Triage Vitals [11/17/23 0856]   BP (Non-Invasive) (!) 159/95   Heart Rate 87   Respiratory Rate 17    Temperature (!) 35.2 C (95.3 F)   SpO2 100 %   Weight 82.6 kg (182 lb)   Height 1.575 m (5' 2)     Physical Exam  General: No acute distress nontoxic, slightly anxious.    Ears: TMs clear throat for traction   Nasal:  Pink and moist bilaterally patent   Oral:  Tongue is slightly dry, mucosa is pink.    Pharynx: Pink moist without PND, exudate, petechiae   Neck: Supple   Lungs: Clear symmetrical good aeration   Heart: Regular rate rhythm S1-S2 without murmur gallop   Abdomen: Soft, normal bowel sounds, mild epigastric tenderness to palpation without guarding, rebound, or referred pain.    Extremities:  Moving symmetrically   Skin: No suspicious rashes or lesions and no edema.    Neurological: No focal deficits   Psychiatric: Slightly anxious.    Patient Data   Labs Ordered/Reviewed   COMPREHENSIVE METABOLIC PANEL, NON-FASTING - Abnormal; Notable for the following components:       Result Value    POTASSIUM 3.1 (*)     ANION GAP 17 (*)     PROTEIN TOTAL 8.6 (*)     All other components within normal limits    Narrative:     Estimated Glomerular Filtration Rate (eGFR) is calculated using the CKD-EPI (2021) equation, intended for patients 34 years of age and older. If gender is not documented or unknown, there will be no eGFR calculation.  LACTIC ACID LEVEL W/ REFLEX FOR LEVEL >2.0 - Abnormal; Notable for the following components:    LACTIC ACID 2.1 (*)     All other components within normal limits   CBC WITH DIFF - Abnormal; Notable for the following components:    MCV 95.7 (*)     MPV 6.6 (*)     All other components within normal limits   URINALYSIS, MACRO/MICRO - Abnormal; Notable for the following components:    LEUKOCYTES Trace (*)     BLOOD Small (*)     All other components within normal limits   OCCULT BLOOD, STOOL - Abnormal; Notable for the following components:    OCCULT BLOOD Positive (*)     All other components within normal limits    Narrative:     0742 3L  75029H 2027-07   MAGNESIUM  - Abnormal;  Notable for the following components:    MAGNESIUM  1.7 (*)     All other components within normal limits   URINALYSIS, MICROSCOPIC - Abnormal; Notable for the following components:    BACTERIA Rare (*)     All other components within normal limits   LIPASE - Normal   LACTIC ACID - FIRST REFLEX - Normal   URINE CULTURE,ROUTINE   CBC/DIFF    Narrative:     The following orders were created for panel order CBC/DIFF.  Procedure                               Abnormality         Status                     ---------                               -----------         ------                     CBC WITH IPQQ[[id number]]                Abnormal            Final result                 Please view results for these tests on the individual orders.   URINALYSIS WITH REFLEX MICROSCOPIC AND CULTURE IF POSITIVE    Narrative:     The following orders were created for panel order URINALYSIS WITH REFLEX MICROSCOPIC AND CULTURE IF POSITIVE.  Procedure                               Abnormality         Status                     ---------                               -----------         ------                     URINALYSIS, MACRO/MICRO[[id number]]      Abnormal            Final result  URINALYSIS, MICROSCOPIC[[id number]]      Abnormal            Final result                 Please view results for these tests on the individual orders.     CT ABDOMEN PELVIS W IV CONTRAST   Final Result by Edi, Radresults In (08/17 1515)   1. There is mural thickening around the pylorus and proximal duodenum. This could be due to antritis and duodenitis. Negative for free air or abscess..   2. This was also described on prior scans            Radiologist location ID: [id number]         XR ABD FLAT AND UPRIGHT SERIES (W PA CHEST)   Final Result by Edi, Radresults In (08/17 9057)   No acute abdominal findings. Mild bilateral basilar atelectasis.            Radiologist location ID: [id number]           Medical Decision Making        Medical  Decision Making  High complexity patient's presentation, we will reassess CBC, CMP, lipase, lactic acid for hydration, infectious process or electrolyte abnormalities.  Flat and upright abdomen with portable chest to assess for obstipation, free air under the diaphragm, other findings to warrant a 2nd CT scan in 7 days of the abdomen pelvis.  She was given IV Tylenol  for pain, 1500 cc of a 2400 cc projected 30 milligram/kilogram fluid bolus should you have a high lactic acid and white count.  Lactic acid normalized with IV fluids, white count is normal, potassium was 3.1 and replaced, magnesium  3.7 and replaced.  The contrasted CT did again redemonstrate the enteritis and duodenitis around the area of the pylorus.  She stopped her NSAIDs has a yesterday with yesterday's visit, she has not had an EGD or evaluation by Dr. Tobie in about 3-4 years, was advised to follow up with the him.  She is call her family medical doctor or provider for an appointment, call tomorrow and call Dr. Anthony office for a follow up and evaluation as well as she likely does not need another endoscopy.  She is exam your home medications with the exception of no Motrin.  Take your Zofran  as needed and bland diet is to continue.    Amount and/or Complexity of Data Reviewed  Labs: ordered.     Details: Labs reviewed and noted, potassium and magnesium  replaced  Radiology: ordered.     Details: Contrasted CT of the abdomen pelvis showed continue enteritis and duodenitis.    Risk  OTC drugs.  Prescription drug management.  Parenteral controlled substances.      ED Course as of 11/17/23 1547   Sun Nov 17, 2023   1319 Patient has drank very little over contrast for the oral contrasted portion of the CT scan, states she had nausea and vomiting at the restroom, however, she was never unhooked from the monitor to go to the restroom.  Her an emesis bag is empty.  She was advised that she must drink this contrast if she wants to have the CT so that we  can get a better picture of what is going on and a better diagnosis to assist with her pain and vomiting.  She was given 4 mg of Zofran  IV, and states that she will try to drink the contrast.  Medications Ordered/Administered in the ED   acetaminophen  (OFIRMEV ) 1,000 mg (10 mg/mL) IV 100 mL (tot vol) (has no administration in time range)   LR bolus infusion 1,500 mL (0 mL Intravenous Stopped 11/17/23 1034)   acetaminophen  (OFIRMEV ) 1,000 mg (10 mg/mL) IV 100 mL (tot vol) (0 mg Intravenous Stopped 11/17/23 1019)   buprenorphine  (BUPRENEX ) 0.3 mg/mL injection (0.15 mg IntraMUSCULAR Given 11/17/23 1247)   ondansetron  (ZOFRAN ) 2 mg/mL injection (4 mg Intravenous Given 11/17/23 1322)   magnesium  sulfate 1 G in D5W 100 mL premix IVPB (0 g Intravenous Stopped 11/17/23 1357)   iopamidol  (ISOVUE -370) 76% infusion (75 mL Intravenous Given 11/17/23 1503)   diatrizoate  meglumine  & sodium oral solution (30 mL Oral Given 11/17/23 1504)     Clinical Impression   Antritis of stomach   Duodenitis   Bilious vomiting with nausea (Primary)   Upper abdominal pain   Excessive use of nonsteroidal anti-inflammatory drug (NSAID)       Disposition: Discharged

## 2023-11-17 NOTE — Discharge Instructions (Addendum)
 You have inflammation of the antral or distal area of the stomach, and the proximal area of the small intestine call the duodenum.  Continue your Protonix , bland diet as you were given yesterday, and no nonsteroidals such as Motrin, Advil, ibuprofen, Naprosyn, a leave, ketoprofen or Voltaren.    Continue other home medications as prescribed, call your primary care provider for follow up and call Dr. Anthony office for an appointment with he or his staff for evaluation.  With continuing inflammation you most likely need an upper endoscopy to have this evaluated with possible biopsies.    Increase your fluids, continue a bland diet avoiding peppers, onions, garlic, tomatoes, high fat foods, fried foods and dairy products.    Continue the Zofran  you have at home as prescribed if needed for nausea and vomiting.

## 2023-11-17 NOTE — ED Nurses Note (Signed)
 Patient discharged home.  AVS reviewed with patient.  A written copy of the AVS and discharge instructions was given to the patient. Understanding verbalized. Questions sufficiently answered as needed.  Patient encouraged to follow up with PCP as indicated.  In the event of an emergency, patient instructed to call 911 or go to the nearest emergency room.

## 2023-11-19 ENCOUNTER — Ambulatory Visit (HOSPITAL_BASED_OUTPATIENT_CLINIC_OR_DEPARTMENT_OTHER): Payer: Self-pay | Admitting: Physician Assistant

## 2023-11-19 LAB — URINE CULTURE,ROUTINE: URINE CULTURE: 10000 — AB

## 2024-02-14 ENCOUNTER — Other Ambulatory Visit: Payer: Self-pay

## 2024-02-14 ENCOUNTER — Inpatient Hospital Stay
Admission: EM | Admit: 2024-02-14 | Discharge: 2024-02-24 | DRG: 189 | Disposition: A | Discharge status: Other Acute Inpatient Hospital | Attending: HOSPITALIST | Admitting: HOSPITALIST

## 2024-02-14 ENCOUNTER — Encounter (HOSPITAL_BASED_OUTPATIENT_CLINIC_OR_DEPARTMENT_OTHER): Payer: Self-pay

## 2024-02-14 ENCOUNTER — Emergency Department (HOSPITAL_BASED_OUTPATIENT_CLINIC_OR_DEPARTMENT_OTHER)

## 2024-02-14 ENCOUNTER — Inpatient Hospital Stay (HOSPITAL_COMMUNITY)

## 2024-02-14 DIAGNOSIS — J189 Pneumonia, unspecified organism: Secondary | ICD-10-CM | POA: Diagnosis present

## 2024-02-14 DIAGNOSIS — R918 Other nonspecific abnormal finding of lung field: Secondary | ICD-10-CM

## 2024-02-14 DIAGNOSIS — N179 Acute kidney failure, unspecified: Secondary | ICD-10-CM

## 2024-02-14 DIAGNOSIS — T502X5A Adverse effect of carbonic-anhydrase inhibitors, benzothiadiazides and other diuretics, initial encounter: Secondary | ICD-10-CM | POA: Diagnosis not present

## 2024-02-14 DIAGNOSIS — J984 Other disorders of lung: Secondary | ICD-10-CM | POA: Diagnosis present

## 2024-02-14 DIAGNOSIS — Z8711 Personal history of peptic ulcer disease: Secondary | ICD-10-CM

## 2024-02-14 DIAGNOSIS — M47814 Spondylosis without myelopathy or radiculopathy, thoracic region: Secondary | ICD-10-CM

## 2024-02-14 DIAGNOSIS — Z1152 Encounter for screening for COVID-19: Secondary | ICD-10-CM

## 2024-02-14 DIAGNOSIS — I509 Heart failure, unspecified: Secondary | ICD-10-CM

## 2024-02-14 DIAGNOSIS — Z79899 Other long term (current) drug therapy: Secondary | ICD-10-CM

## 2024-02-14 DIAGNOSIS — R0602 Shortness of breath: Secondary | ICD-10-CM

## 2024-02-14 DIAGNOSIS — E872 Acidosis, unspecified: Secondary | ICD-10-CM | POA: Diagnosis not present

## 2024-02-14 DIAGNOSIS — N1330 Unspecified hydronephrosis: Secondary | ICD-10-CM | POA: Diagnosis not present

## 2024-02-14 DIAGNOSIS — T380X5A Adverse effect of glucocorticoids and synthetic analogues, initial encounter: Secondary | ICD-10-CM | POA: Diagnosis not present

## 2024-02-14 DIAGNOSIS — J9601 Acute respiratory failure with hypoxia: Principal | ICD-10-CM | POA: Diagnosis present

## 2024-02-14 DIAGNOSIS — R7989 Other specified abnormal findings of blood chemistry: Secondary | ICD-10-CM | POA: Diagnosis present

## 2024-02-14 DIAGNOSIS — F32A Depression, unspecified: Secondary | ICD-10-CM | POA: Diagnosis present

## 2024-02-14 DIAGNOSIS — I5033 Acute on chronic diastolic (congestive) heart failure: Secondary | ICD-10-CM | POA: Diagnosis present

## 2024-02-14 DIAGNOSIS — I358 Other nonrheumatic aortic valve disorders: Secondary | ICD-10-CM | POA: Diagnosis present

## 2024-02-14 DIAGNOSIS — J9600 Acute respiratory failure, unspecified whether with hypoxia or hypercapnia: Secondary | ICD-10-CM | POA: Insufficient documentation

## 2024-02-14 DIAGNOSIS — Z8701 Personal history of pneumonia (recurrent): Secondary | ICD-10-CM

## 2024-02-14 DIAGNOSIS — E875 Hyperkalemia: Secondary | ICD-10-CM | POA: Diagnosis not present

## 2024-02-14 DIAGNOSIS — R0600 Dyspnea, unspecified: Secondary | ICD-10-CM

## 2024-02-14 DIAGNOSIS — Z7982 Long term (current) use of aspirin: Secondary | ICD-10-CM

## 2024-02-14 DIAGNOSIS — R0902 Hypoxemia: Principal | ICD-10-CM

## 2024-02-14 DIAGNOSIS — I21A1 Myocardial infarction type 2: Secondary | ICD-10-CM | POA: Diagnosis present

## 2024-02-14 DIAGNOSIS — F419 Anxiety disorder, unspecified: Secondary | ICD-10-CM | POA: Diagnosis present

## 2024-02-14 DIAGNOSIS — J188 Other pneumonia, unspecified organism: Secondary | ICD-10-CM

## 2024-02-14 DIAGNOSIS — I214 Non-ST elevation (NSTEMI) myocardial infarction: Secondary | ICD-10-CM | POA: Diagnosis present

## 2024-02-14 DIAGNOSIS — I110 Hypertensive heart disease with heart failure: Secondary | ICD-10-CM | POA: Diagnosis present

## 2024-02-14 LAB — COMPREHENSIVE METABOLIC PANEL, NON-FASTING
ALBUMIN/GLOBULIN RATIO: 0.8 (ref 0.8–1.4)
ALBUMIN: 3.3 g/dL — ABNORMAL LOW (ref 3.4–5.0)
ALKALINE PHOSPHATASE: 98 U/L (ref 46–116)
ALT (SGPT): 11 U/L (ref <=78)
ANION GAP: 16 mmol/L — ABNORMAL HIGH (ref 4–13)
AST (SGOT): 43 U/L — ABNORMAL HIGH (ref 15–37)
BILIRUBIN TOTAL: 0.3 mg/dL (ref 0.2–1.0)
BUN/CREA RATIO: 15
BUN: 10 mg/dL (ref 7–18)
CALCIUM, CORRECTED: 9.5 mg/dL
CALCIUM: 8.9 mg/dL (ref 8.5–10.1)
CHLORIDE: 102 mmol/L (ref 98–107)
CO2 TOTAL: 19 mmol/L — ABNORMAL LOW (ref 21–32)
CREATININE: 0.68 mg/dL (ref 0.55–1.02)
ESTIMATED GFR: 93 mL/min/1.73mˆ2 (ref >59)
GLOBULIN: 4.2
GLUCOSE: 102 mg/dL (ref 74–106)
OSMOLALITY, CALCULATED: 273 mosm/kg (ref 270–290)
POTASSIUM: 3.8 mmol/L (ref 3.5–5.1)
PROTEIN TOTAL: 7.5 g/dL (ref 6.4–8.2)
SODIUM: 137 mmol/L (ref 136–145)

## 2024-02-14 LAB — CBC WITH DIFF
BASOPHIL #: 0.04 x10ˆ3/uL (ref 0.00–0.10)
BASOPHIL %: 0 % (ref 0–1)
EOSINOPHIL #: 1.1 x10ˆ3/uL — ABNORMAL HIGH (ref 0.00–0.50)
EOSINOPHIL %: 6 % (ref 1–7)
HCT: 34.7 % (ref 31.2–41.9)
HGB: 11.4 g/dL (ref 10.9–14.3)
LYMPHOCYTE #: 1.36 x10ˆ3/uL (ref 1.10–3.10)
LYMPHOCYTE %: 8 % — ABNORMAL LOW (ref 16–46)
MCH: 29.6 pg (ref 24.7–32.8)
MCHC: 32.9 g/dL (ref 32.3–35.6)
MCV: 90.1 fL (ref 75.5–95.3)
MONOCYTE #: 0.88 x10ˆ3/uL (ref 0.20–0.90)
MONOCYTE %: 5 % (ref 4–11)
MPV: 6.8 fL — ABNORMAL LOW (ref 7.9–10.8)
NEUTROPHIL #: 14.13 x10ˆ3/uL — ABNORMAL HIGH (ref 1.90–8.20)
NEUTROPHIL %: 81 % — ABNORMAL HIGH (ref 43–77)
PLATELETS: 358 x10ˆ3/uL (ref 140–440)
RBC: 3.85 x10ˆ6/uL (ref 3.63–4.92)
RDW: 17.4 % (ref 12.3–17.7)
WBC: 17.5 x10ˆ3/uL — ABNORMAL HIGH (ref 3.8–11.8)

## 2024-02-14 LAB — COVID-19, FLU A/B, RSV RAPID BY PCR
INFLUENZA VIRUS TYPE A: NOT DETECTED
INFLUENZA VIRUS TYPE B: NOT DETECTED
RESPIRATORY SYNCTIAL VIRUS (RSV): NOT DETECTED
SARS-CoV-2: NOT DETECTED

## 2024-02-14 LAB — BLOOD GAS
%FIO2 (ARTERIAL): 21 %
%FIO2 (ARTERIAL): 36 %
BASE DEFICIT: 5.7 mmol/L — ABNORMAL HIGH (ref 0.0–3.0)
BASE EXCESS (ARTERIAL): 3.1 mmol/L — ABNORMAL HIGH (ref 0.0–3.0)
BICARBONATE (ARTERIAL): 20.2 mmol/L — ABNORMAL LOW (ref 21.0–28.0)
BICARBONATE (ARTERIAL): 27.2 mmol/L (ref 21.0–28.0)
O2 SATURATION (ARTERIAL): 88.1 % (ref 94.0–98.0)
O2 SATURATION (ARTERIAL): 92 % (ref 94.0–98.0)
PAO2/FIO2 RATIO: 267
PAO2/FIO2 RATIO: 167
PCO2 (ARTERIAL): 31 mmHg — ABNORMAL LOW (ref 35–45)
PCO2 (ARTERIAL): 38 mmHg (ref 35–45)
PH (ARTERIAL): 7.38 (ref 7.35–7.45)
PH (ARTERIAL): 7.46 — ABNORMAL HIGH (ref 7.35–7.45)
PO2 (ARTERIAL): 56 mmHg — ABNORMAL LOW (ref 83–108)
PO2 (ARTERIAL): 60 mmHg — ABNORMAL LOW (ref 83–108)

## 2024-02-14 LAB — PT/INR
INR: 1.13 — ABNORMAL HIGH (ref 0.84–1.10)
PROTHROMBIN TIME: 12.8 s — ABNORMAL HIGH (ref 9.8–12.7)

## 2024-02-14 LAB — PTT (PARTIAL THROMBOPLASTIN TIME)
APTT: 26.8 s (ref 25.0–38.0)
APTT: 31.5 s (ref 25.0–38.0)

## 2024-02-14 LAB — TROPONIN-I
TROPONIN I: 232 ng/L — ABNORMAL HIGH (ref <15)
TROPONIN I: 255 ng/L — ABNORMAL HIGH (ref <15)
TROPONIN I: 247 ng/L — ABNORMAL HIGH (ref <15)
TROPONIN I: 115 ng/L — ABNORMAL HIGH (ref <15)

## 2024-02-14 LAB — D-DIMER: D-DIMER: 2236 ng{FEU}/mL (ref 215–500)

## 2024-02-14 LAB — LACTIC ACID LEVEL W/ REFLEX FOR LEVEL >2.0: LACTIC ACID: 1.6 mmol/L (ref 0.4–2.0)

## 2024-02-14 LAB — NT-PROBNP: NT-PROBNP: 1454 pg/mL — ABNORMAL HIGH (ref <=125)

## 2024-02-14 LAB — SCAN DIFFERENTIAL
PLATELET MORPHOLOGY COMMENT: NORMAL
RBC MORPHOLOGY COMMENT: NORMAL
SCHISTOCYTES: ABSENT

## 2024-02-14 LAB — MAGNESIUM: MAGNESIUM: 1.3 mg/dL — ABNORMAL LOW (ref 1.8–2.4)

## 2024-02-14 MED ORDER — PIPERACILLIN-TAZOBACTAM 4.5 GRAM INTRAVENOUS SOLUTION
INTRAVENOUS | Status: AC
Start: 2024-02-14 — End: 2024-02-14
  Filled 2024-02-14: qty 20

## 2024-02-14 MED ORDER — ZINC OXIDE 20 % TOPICAL OINTMENT
TOPICAL_OINTMENT | CUTANEOUS | Status: DC | PRN
Start: 2024-02-14 — End: 2024-02-24

## 2024-02-14 MED ORDER — MAGNESIUM SULFATE 1 GRAM/100 ML IN DEXTROSE 5 % INTRAVENOUS PIGGYBACK
1.0000 g | INJECTION | Freq: Once | INTRAVENOUS | Status: AC
Start: 2024-02-14 — End: 2024-02-15
  Administered 2024-02-14: 1 g via INTRAVENOUS
  Administered 2024-02-15: 0 g via INTRAVENOUS
  Filled 2024-02-14: qty 100

## 2024-02-14 MED ORDER — DOXYCYCLINE HYCLATE 100 MG INTRAVENOUS POWDER FOR SOLUTION
INTRAVENOUS | Status: AC
Start: 2024-02-14 — End: 2024-02-14
  Filled 2024-02-14: qty 10

## 2024-02-14 MED ORDER — FUROSEMIDE 10 MG/ML INJECTION SOLUTION
40.0000 mg | INTRAMUSCULAR | Status: AC
Start: 2024-02-14 — End: 2024-02-14
  Administered 2024-02-14: 40 mg via INTRAVENOUS

## 2024-02-14 MED ORDER — HEPARIN (PORCINE) 5,000 UNIT/ML INJECTION SOLUTION
INTRAMUSCULAR | Status: AC
Start: 2024-02-14 — End: 2024-02-14
  Filled 2024-02-14: qty 1

## 2024-02-14 MED ORDER — HEPARIN (PORCINE) 5,000 UNITS/ML BOLUS
60.0000 [IU]/kg | Freq: Once | INTRAMUSCULAR | Status: AC
Start: 2024-02-14 — End: 2024-02-14
  Administered 2024-02-14: 4000 [IU] via INTRAVENOUS

## 2024-02-14 MED ORDER — LEVOFLOXACIN 750 MG/150 ML IN 5 % DEXTROSE INTRAVENOUS PIGGYBACK
750.0000 mg | INJECTION | INTRAVENOUS | Status: DC
Start: 2024-02-15 — End: 2024-02-17
  Administered 2024-02-15: 750 mg via INTRAVENOUS
  Administered 2024-02-15 – 2024-02-16 (×2): 0 mg via INTRAVENOUS
  Administered 2024-02-16 – 2024-02-17 (×2): 750 mg via INTRAVENOUS
  Administered 2024-02-17: 0 mg via INTRAVENOUS
  Filled 2024-02-14 (×4): qty 150

## 2024-02-14 MED ORDER — METHYLPREDNISOLONE SOD SUCCINATE 40 MG/ML SOLUTION FOR INJ. WRAPPER
80.0000 mg | INTRAMUSCULAR | Status: AC
Start: 2024-02-14 — End: 2024-02-14
  Administered 2024-02-14: 80 mg via INTRAVENOUS
  Filled 2024-02-14: qty 2

## 2024-02-14 MED ORDER — IPRATROPIUM 0.5 MG-ALBUTEROL 3 MG (2.5 MG BASE)/3 ML NEBULIZATION SOLN
3.0000 mL | INHALATION_SOLUTION | Freq: Four times a day (QID) | RESPIRATORY_TRACT | Status: DC
Start: 2024-02-14 — End: 2024-02-14

## 2024-02-14 MED ORDER — IPRATROPIUM 0.5 MG-ALBUTEROL 3 MG (2.5 MG BASE)/3 ML NEBULIZATION SOLN
3.0000 mL | INHALATION_SOLUTION | RESPIRATORY_TRACT | Status: AC
Start: 2024-02-14 — End: 2024-02-14
  Administered 2024-02-14: 3 mL via RESPIRATORY_TRACT

## 2024-02-14 MED ORDER — HEPARIN (PORCINE) 5,000 UNITS/ML BOLUS FOR DOSE ADJUSTMENT
50.0000 [IU]/kg | Freq: Once | INTRAMUSCULAR | Status: AC
Start: 2024-02-14 — End: 2024-02-14
  Administered 2024-02-14: 3000 [IU] via INTRAVENOUS
  Filled 2024-02-14: qty 1

## 2024-02-14 MED ORDER — MAGNESIUM SULFATE 1 GRAM/100 ML IN DEXTROSE 5 % INTRAVENOUS PIGGYBACK
INJECTION | INTRAVENOUS | Status: AC
Start: 2024-02-14 — End: 2024-02-14
  Filled 2024-02-14: qty 300

## 2024-02-14 MED ORDER — BUDESONIDE 0.5 MG/2 ML SUSPENSION FOR NEBULIZATION
1.0000 mg | INHALATION_SUSPENSION | Freq: Two times a day (BID) | RESPIRATORY_TRACT | Status: DC
Start: 2024-02-14 — End: 2024-02-24
  Administered 2024-02-14: 0.5 mg via RESPIRATORY_TRACT
  Administered 2024-02-14 – 2024-02-23 (×19): 1 mg via RESPIRATORY_TRACT
  Administered 2024-02-24: 0 mg via RESPIRATORY_TRACT
  Administered 2024-02-24: 1 mg via RESPIRATORY_TRACT
  Administered 2024-02-24: 0 mg via RESPIRATORY_TRACT
  Filled 2024-02-14 (×3): qty 4

## 2024-02-14 MED ORDER — LEVOFLOXACIN 750 MG/150 ML IN 5 % DEXTROSE INTRAVENOUS PIGGYBACK
750.0000 mg | INJECTION | INTRAVENOUS | Status: DC
Start: 2024-02-15 — End: 2024-02-14
  Filled 2024-02-14: qty 150

## 2024-02-14 MED ORDER — CYCLOBENZAPRINE 10 MG TABLET
10.0000 mg | ORAL_TABLET | Freq: Three times a day (TID) | ORAL | Status: DC | PRN
Start: 2024-02-14 — End: 2024-02-24
  Administered 2024-02-17 – 2024-02-23 (×9): 10 mg via ORAL
  Filled 2024-02-14 (×9): qty 1

## 2024-02-14 MED ORDER — ARIPIPRAZOLE 10 MG TABLET
30.0000 mg | ORAL_TABLET | Freq: Every day | ORAL | Status: DC
Start: 2024-02-15 — End: 2024-02-24
  Administered 2024-02-15 – 2024-02-24 (×10): 30 mg via ORAL
  Filled 2024-02-14 (×9): qty 3
  Filled 2024-02-14: qty 2
  Filled 2024-02-14: qty 3

## 2024-02-14 MED ORDER — ATORVASTATIN 40 MG TABLET
80.0000 mg | ORAL_TABLET | Freq: Every evening | ORAL | Status: DC
Start: 2024-02-14 — End: 2024-02-24
  Administered 2024-02-14 – 2024-02-23 (×10): 80 mg via ORAL
  Filled 2024-02-14 (×10): qty 2

## 2024-02-14 MED ORDER — METHYLPREDNISOLONE SOD SUCC 125 MG SOLUTION FOR INJECTION WRAPPER
INTRAVENOUS | Status: AC
Start: 2024-02-14 — End: 2024-02-14
  Filled 2024-02-14: qty 2

## 2024-02-14 MED ORDER — HEPARIN (PORCINE) 25,000 UNIT/250 ML (100 UNIT/ML) IN DEXTROSE 5 % IV
INTRAVENOUS | Status: AC
Start: 2024-02-14 — End: 2024-02-14
  Filled 2024-02-14: qty 250

## 2024-02-14 MED ORDER — IPRATROPIUM 0.5 MG-ALBUTEROL 3 MG (2.5 MG BASE)/3 ML NEBULIZATION SOLN
3.0000 mL | INHALATION_SOLUTION | Freq: Four times a day (QID) | RESPIRATORY_TRACT | Status: DC | PRN
Start: 2024-02-14 — End: 2024-02-19
  Administered 2024-02-17: 3 mL via RESPIRATORY_TRACT

## 2024-02-14 MED ORDER — SODIUM CHLORIDE 0.9 % INTRAVENOUS SOLUTION
100.0000 mg | INTRAVENOUS | Status: AC
Start: 2024-02-14 — End: 2024-02-14
  Administered 2024-02-14: 100 mg via INTRAVENOUS
  Administered 2024-02-14: 0 mg via INTRAVENOUS

## 2024-02-14 MED ORDER — ASPIRIN 81 MG CHEWABLE TABLET
81.0000 mg | CHEWABLE_TABLET | Freq: Every day | ORAL | Status: DC
Start: 2024-02-15 — End: 2024-02-24
  Administered 2024-02-15 – 2024-02-24 (×10): 81 mg via ORAL
  Filled 2024-02-14 (×10): qty 1

## 2024-02-14 MED ORDER — SODIUM CHLORIDE 0.9 % INTRAVENOUS SOLUTION
4.5000 g | INTRAVENOUS | Status: AC
Start: 2024-02-14 — End: 2024-02-14
  Administered 2024-02-14: 4.5 g via INTRAVENOUS
  Administered 2024-02-14: 0 g via INTRAVENOUS

## 2024-02-14 MED ORDER — ZINC OXIDE 20 % TOPICAL OINTMENT
TOPICAL_OINTMENT | CUTANEOUS | Status: AC
Start: 2024-02-14 — End: 2024-02-14
  Filled 2024-02-14: qty 57

## 2024-02-14 MED ORDER — ENOXAPARIN 60 MG/0.6 ML SUBCUTANEOUS SYRINGE
90.0000 mg | INJECTION | Freq: Two times a day (BID) | SUBCUTANEOUS | Status: DC
Start: 2024-02-14 — End: 2024-02-17
  Administered 2024-02-14 – 2024-02-17 (×6): 90 mg via SUBCUTANEOUS
  Filled 2024-02-14 (×6): qty 0.3

## 2024-02-14 MED ORDER — MAGNESIUM SULFATE 1 GRAM/100 ML IN DEXTROSE 5 % INTRAVENOUS PIGGYBACK
1.0000 g | INJECTION | INTRAVENOUS | Status: AC
Start: 2024-02-14 — End: 2024-02-14
  Administered 2024-02-14 (×2): 1 g via INTRAVENOUS
  Administered 2024-02-14 (×3): 0 g via INTRAVENOUS
  Administered 2024-02-14 (×2): 1 g via INTRAVENOUS
  Administered 2024-02-14: 0 g via INTRAVENOUS

## 2024-02-14 MED ORDER — METHYLPREDNISOLONE SOD SUCC 125 MG SOLUTION FOR INJECTION WRAPPER
125.0000 mg | INTRAVENOUS | Status: AC
Start: 2024-02-14 — End: 2024-02-14
  Administered 2024-02-14: 125 mg via INTRAVENOUS

## 2024-02-14 MED ORDER — MAGNESIUM SULFATE 1 GRAM/100 ML IN DEXTROSE 5 % INTRAVENOUS PIGGYBACK
INJECTION | INTRAVENOUS | Status: AC
Start: 2024-02-14 — End: 2024-02-14
  Filled 2024-02-14: qty 100

## 2024-02-14 MED ORDER — BUDESONIDE 0.5 MG/2 ML SUSPENSION FOR NEBULIZATION
INHALATION_SUSPENSION | RESPIRATORY_TRACT | Status: AC
Start: 2024-02-14 — End: 2024-02-14
  Filled 2024-02-14: qty 4

## 2024-02-14 MED ORDER — IOPAMIDOL 370 MG IODINE/ML (76 %) INTRAVENOUS SOLUTION
100.0000 mL | INTRAVENOUS | Status: AC
Start: 2024-02-14 — End: 2024-02-14
  Administered 2024-02-14: 100 mL via INTRAVENOUS
  Filled 2024-02-14: qty 100

## 2024-02-14 MED ORDER — ENOXAPARIN 40 MG/0.4 ML SUBCUTANEOUS SYRINGE
40.0000 mg | INJECTION | SUBCUTANEOUS | Status: DC
Start: 2024-02-14 — End: 2024-02-14

## 2024-02-14 MED ORDER — HEPARIN (PORCINE) 25,000 UNIT/250 ML (100 UNIT/ML) IN DEXTROSE 5 % IV
12.0000 [IU]/kg/h | INTRAVENOUS | Status: DC
Start: 2024-02-14 — End: 2024-02-14
  Administered 2024-02-14: 16 [IU]/kg/h via INTRAVENOUS
  Administered 2024-02-14: 0 [IU]/kg/h via INTRAVENOUS
  Administered 2024-02-14 (×2): 12 [IU]/kg/h via INTRAVENOUS

## 2024-02-14 MED ORDER — FUROSEMIDE 10 MG/ML INJECTION SOLUTION
INTRAMUSCULAR | Status: AC
Start: 2024-02-14 — End: 2024-02-14
  Filled 2024-02-14: qty 4

## 2024-02-14 MED ORDER — ALBUTEROL SULFATE 2.5 MG/3 ML (0.083 %) SOLUTION FOR NEBULIZATION
2.5000 mg | INHALATION_SOLUTION | RESPIRATORY_TRACT | Status: DC | PRN
Start: 2024-02-14 — End: 2024-02-24
  Administered 2024-02-15: 2.5 mg via RESPIRATORY_TRACT
  Filled 2024-02-14 (×2): qty 3

## 2024-02-14 MED ORDER — IPRATROPIUM 0.5 MG-ALBUTEROL 3 MG (2.5 MG BASE)/3 ML NEBULIZATION SOLN
INHALATION_SOLUTION | RESPIRATORY_TRACT | Status: AC
Start: 2024-02-14 — End: 2024-02-14
  Filled 2024-02-14: qty 3

## 2024-02-15 ENCOUNTER — Inpatient Hospital Stay (HOSPITAL_COMMUNITY)

## 2024-02-15 DIAGNOSIS — I214 Non-ST elevation (NSTEMI) myocardial infarction: Secondary | ICD-10-CM

## 2024-02-15 DIAGNOSIS — J9601 Acute respiratory failure with hypoxia: Principal | ICD-10-CM

## 2024-02-15 DIAGNOSIS — I10 Essential (primary) hypertension: Secondary | ICD-10-CM

## 2024-02-15 DIAGNOSIS — J189 Pneumonia, unspecified organism: Secondary | ICD-10-CM

## 2024-02-15 LAB — LIPID PANEL
CHOL/HDL RATIO: 2.7
CHOLESTEROL: 173 mg/dL (ref <200)
HDL CHOL: 65 mg/dL (ref >=40)
LDL CALC: 87 mg/dL (ref 0–100)
TRIGLYCERIDES: 106 mg/dL (ref <=150)
VLDL CALC: 21 mg/dL (ref 0–50)

## 2024-02-15 LAB — CBC WITH DIFF
BASOPHIL #: 0 x10ˆ3/uL (ref 0.00–0.10)
BASOPHIL %: 0 % (ref 0–1)
EOSINOPHIL #: 0 x10ˆ3/uL (ref 0.00–0.50)
EOSINOPHIL %: 0 % — ABNORMAL LOW (ref 1–7)
HCT: 35.2 % (ref 31.2–41.9)
HGB: 11.9 g/dL (ref 10.9–14.3)
LYMPHOCYTE #: 1 x10ˆ3/uL — ABNORMAL LOW (ref 1.10–3.10)
LYMPHOCYTE %: 8 % — ABNORMAL LOW (ref 16–46)
MCH: 28.9 pg (ref 24.7–32.8)
MCHC: 33.9 g/dL (ref 32.3–35.6)
MCV: 85.2 fL (ref 75.5–95.3)
MONOCYTE #: 0.5 x10ˆ3/uL (ref 0.20–0.90)
MONOCYTE %: 4 % (ref 4–11)
MPV: 6.4 fL — ABNORMAL LOW (ref 7.9–10.8)
NEUTROPHIL #: 11.2 x10ˆ3/uL — ABNORMAL HIGH (ref 1.90–8.20)
NEUTROPHIL %: 88 % — ABNORMAL HIGH (ref 43–77)
PLATELETS: 416 x10ˆ3/uL (ref 140–440)
RBC: 4.13 x10ˆ6/uL (ref 3.63–4.92)
RDW: 16.1 % (ref 12.3–17.7)
WBC: 12.8 x10ˆ3/uL — ABNORMAL HIGH (ref 3.8–11.8)

## 2024-02-15 LAB — COMPREHENSIVE METABOLIC PANEL, NON-FASTING
ALBUMIN/GLOBULIN RATIO: 1 (ref 0.8–1.4)
ALBUMIN: 3.9 g/dL (ref 3.5–5.7)
ALKALINE PHOSPHATASE: 75 U/L (ref 34–104)
ALT (SGPT): 7 U/L (ref 7–52)
ANION GAP: 13 mmol/L (ref 4–13)
AST (SGOT): 16 U/L (ref 13–39)
BILIRUBIN TOTAL: 0.3 mg/dL (ref 0.3–1.0)
BUN/CREA RATIO: 20 (ref 6–22)
BUN: 15 mg/dL (ref 7–25)
CALCIUM, CORRECTED: 9.3 mg/dL (ref 8.9–10.8)
CALCIUM: 9.2 mg/dL (ref 8.6–10.3)
CHLORIDE: 100 mmol/L (ref 98–107)
CO2 TOTAL: 22 mmol/L (ref 21–31)
CREATININE: 0.74 mg/dL (ref 0.60–1.30)
ESTIMATED GFR: 86 mL/min/1.73mˆ2 (ref >59)
GLOBULIN: 3.8 — ABNORMAL HIGH (ref 2.0–3.5)
GLUCOSE: 123 mg/dL — ABNORMAL HIGH (ref 74–109)
OSMOLALITY, CALCULATED: 272 mosm/kg (ref 270–290)
POTASSIUM: 3.5 mmol/L (ref 3.5–5.1)
PROTEIN TOTAL: 7.7 g/dL (ref 6.4–8.9)
SODIUM: 135 mmol/L — ABNORMAL LOW (ref 136–145)

## 2024-02-15 LAB — MAGNESIUM: MAGNESIUM: 2.4 mg/dL (ref 1.9–2.7)

## 2024-02-15 LAB — HGA1C (HEMOGLOBIN A1C WITH EST AVG GLUCOSE): HEMOGLOBIN A1C: 6 % (ref 4.0–6.0)

## 2024-02-15 LAB — PROCALCITONIN: PROCALCITONIN: 0.06 ng/mL (ref <0.50)

## 2024-02-15 LAB — TRANSTHORACIC ECHOCARDIOGRAM - ADULT: EF VISUAL ESTIMATE: 60

## 2024-02-15 MED ORDER — SODIUM CHLORIDE 0.9 % INJECTION SOLUTION
2.0000 mL | Freq: Once | INTRAVENOUS | Status: DC | PRN
Start: 2024-02-15 — End: 2024-02-24
  Administered 2024-02-15: 3 mL via INTRAVENOUS

## 2024-02-16 DIAGNOSIS — R7989 Other specified abnormal findings of blood chemistry: Secondary | ICD-10-CM

## 2024-02-16 DIAGNOSIS — I5033 Acute on chronic diastolic (congestive) heart failure: Secondary | ICD-10-CM

## 2024-02-16 DIAGNOSIS — R0902 Hypoxemia: Principal | ICD-10-CM

## 2024-02-16 LAB — CBC WITH DIFF
BASOPHIL #: 0 x10ˆ3/uL (ref 0.00–0.10)
BASOPHIL %: 0 % (ref 0–1)
EOSINOPHIL #: 1.3 x10ˆ3/uL — ABNORMAL HIGH (ref 0.00–0.50)
EOSINOPHIL %: 7 % (ref 1–7)
HCT: 36.1 % (ref 31.2–41.9)
HGB: 12.3 g/dL (ref 10.9–14.3)
LYMPHOCYTE #: 4 x10ˆ3/uL — ABNORMAL HIGH (ref 1.10–3.10)
LYMPHOCYTE %: 22 % (ref 16–46)
MCH: 29.4 pg (ref 24.7–32.8)
MCHC: 34 g/dL (ref 32.3–35.6)
MCV: 86.4 fL (ref 75.5–95.3)
MONOCYTE #: 1.6 x10ˆ3/uL — ABNORMAL HIGH (ref 0.20–0.90)
MONOCYTE %: 9 % (ref 4–11)
MPV: 6.5 fL — ABNORMAL LOW (ref 7.9–10.8)
NEUTROPHIL #: 11.2 x10ˆ3/uL — ABNORMAL HIGH (ref 1.90–8.20)
NEUTROPHIL %: 62 % (ref 43–77)
PLATELETS: 497 x10ˆ3/uL — ABNORMAL HIGH (ref 140–440)
RBC: 4.18 x10ˆ6/uL (ref 3.63–4.92)
RDW: 16 % (ref 12.3–17.7)
WBC: 18.2 x10ˆ3/uL — ABNORMAL HIGH (ref 3.8–11.8)

## 2024-02-16 LAB — MAGNESIUM: MAGNESIUM: 2 mg/dL (ref 1.9–2.7)

## 2024-02-16 LAB — COMPREHENSIVE METABOLIC PANEL, NON-FASTING
ALBUMIN/GLOBULIN RATIO: 0.9 (ref 0.8–1.4)
ALBUMIN: 3.8 g/dL (ref 3.5–5.7)
ALKALINE PHOSPHATASE: 76 U/L (ref 34–104)
ALT (SGPT): 7 U/L (ref 7–52)
ANION GAP: 12 mmol/L (ref 4–13)
AST (SGOT): 16 U/L (ref 13–39)
BILIRUBIN TOTAL: 0.4 mg/dL (ref 0.3–1.0)
BUN/CREA RATIO: 29 — ABNORMAL HIGH (ref 6–22)
BUN: 24 mg/dL (ref 7–25)
CALCIUM, CORRECTED: 9.6 mg/dL (ref 8.9–10.8)
CALCIUM: 9.4 mg/dL (ref 8.6–10.3)
CHLORIDE: 99 mmol/L (ref 98–107)
CO2 TOTAL: 22 mmol/L (ref 21–31)
CREATININE: 0.84 mg/dL (ref 0.60–1.30)
ESTIMATED GFR: 74 mL/min/1.73mˆ2 (ref >59)
GLOBULIN: 4.2 — ABNORMAL HIGH (ref 2.0–3.5)
GLUCOSE: 133 mg/dL — ABNORMAL HIGH (ref 74–109)
OSMOLALITY, CALCULATED: 272 mosm/kg (ref 270–290)
POTASSIUM: 3.6 mmol/L (ref 3.5–5.1)
PROTEIN TOTAL: 8 g/dL (ref 6.4–8.9)
SODIUM: 133 mmol/L — ABNORMAL LOW (ref 136–145)

## 2024-02-16 LAB — ECG 12 LEAD
Atrial Rate: 104 {beats}/min
Atrial Rate: 88 {beats}/min
Calculated P Axis: 14 degrees
Calculated P Axis: 29 degrees
Calculated R Axis: -28 degrees
Calculated R Axis: -28 degrees
Calculated T Axis: 15 degrees
Calculated T Axis: 4 degrees
PR Interval: 156 ms
PR Interval: 144 ms
QRS Duration: 90 ms
QRS Duration: 86 ms
QT Interval: 330 ms
QT Interval: 394 ms
QTC Calculation: 433 ms
QTC Calculation: 476 ms
Ventricular rate: 104 {beats}/min
Ventricular rate: 88 {beats}/min

## 2024-02-16 MED ORDER — POTASSIUM CHLORIDE ER 20 MEQ TABLET,EXTENDED RELEASE(PART/CRYST)
40.0000 meq | ORAL_TABLET | ORAL | Status: AC
Start: 2024-02-16 — End: 2024-02-16
  Administered 2024-02-16: 40 meq via ORAL
  Filled 2024-02-16: qty 2

## 2024-02-16 MED ORDER — PANTOPRAZOLE 40 MG TABLET,DELAYED RELEASE
40.0000 mg | DELAYED_RELEASE_TABLET | Freq: Every day | ORAL | Status: DC
Start: 2024-02-16 — End: 2024-02-24
  Administered 2024-02-17 – 2024-02-24 (×8): 40 mg via ORAL
  Filled 2024-02-16 (×9): qty 1

## 2024-02-16 MED ORDER — FUROSEMIDE 10 MG/ML INJECTION SOLUTION
40.0000 mg | Freq: Once | INTRAMUSCULAR | Status: AC
Start: 2024-02-16 — End: 2024-02-16
  Administered 2024-02-16: 40 mg via INTRAVENOUS
  Filled 2024-02-16: qty 4

## 2024-02-16 NOTE — Assessment & Plan Note (Deleted)
-   patient showed elevated troponin and BNP  - Troponin down trending.   - EKG showed LVH, but negative for new ST-T wave changes.  - patient on therapeutic Lovenox.  - started aspirin  and statins.  - lipid panel and HbA1c were ordered.  - cardiology was consulted.

## 2024-02-16 NOTE — Assessment & Plan Note (Deleted)
 ECHO on 02/15/24 with EF: 60%. No wall motion abnormalities. No diastolic abnormalities noted. Mildly calcified aortic valve.

## 2024-02-16 NOTE — Assessment & Plan Note (Deleted)
-   magnesium  was 1.3 and was replaced.  - Will keep monitoring.

## 2024-02-16 NOTE — Assessment & Plan Note (Deleted)
-   continue home treatment of Abilify .

## 2024-02-16 NOTE — Assessment & Plan Note (Deleted)
-   CT of the chest was negative for PE.

## 2024-02-16 NOTE — Assessment & Plan Note (Deleted)
 Pt presented overnight on 11/14 to our facility due to shortness of breath, worsened with minimal exertion.  - CTA of the chest was positive for bilateral ground-glass pulmonary opacity.  - Patient was started on IV Levaquin .  - procalcitonin, sputum culture, Legionella, streptococcal pneumoniae, MRSA were ordered; pending results.   - patient on 4 L nasal cannula.

## 2024-02-16 NOTE — Progress Notes (Deleted)
 [facility]   Progress Note    Brooke Maxwell  Date of service: 02/16/2024  Date of Admission:  02/14/2024  Hospital Day:  LOS: 2 days     Subjective: Patient continues to be on HFNC today and reports being short of breath. No chest pain or GU/GI Complaints.     Review of Systems:    Review of Systems  10 point ROS negative except mentioned above.     Objective:      Vital Signs:  Vitals:    02/16/24 0738 02/16/24 0900 02/16/24 1041 02/16/24 1313   BP:  129/72     Pulse: (P) 86  97 96   Resp: (P) 20      Temp: (P) 36.4 C (97.5 F)      SpO2:  91% 92%    Weight:       Height:       BMI:                I/O:  I/O last 24 hours:    Intake/Output Summary (Last 24 hours) at 02/16/2024 1658  Last data filed at 02/16/2024 1628  Gross per 24 hour   Intake 940 ml   Output 1375 ml   Net -435 ml       albuterol (PROVENTIL) 2.5 mg / 3 mL (0.083%) neb solution, 2.5 mg, Nebulization, Q4H PRN  ARIPiprazole  (ABILIFY ) tablet, 30 mg, Oral, Daily  aspirin  chewable tablet 81 mg, 81 mg, Oral, Daily  atorvastatin (LIPITOR) tablet, 80 mg, Oral, QPM  budesonide (PULMICORT RESPULES) 0.5 mg/2 mL nebulizer suspension, 1 mg, Nebulization, 2x/day  cyclobenzaprine  (FLEXERIL ) tablet, 10 mg, Oral, 3x/day PRN  enoxaparin PF (LOVENOX) 90 mg SubQ injection (60 mg + 30 mg), 90 mg, Subcutaneous, Q12H  furosemide (LASIX) 10 mg/mL injection, 40 mg, Intravenous, Once  ipratropium-albuterol 0.5 mg-3 mg(2.5 mg base)/3 mL Solution for Nebulization, 3 mL, Nebulization, 4x/day PRN  levoFLOXacin  (LEVAQUIN ) 750 mg in D5W 150 mL premix IVPB, 750 mg, Intravenous, Q24H  perflutren lipid microspheres (DEFINITY) 1.3 mL in NS 10 mL (tot vol) injection, 2 mL, Intravenous, Cardiology Once PRN  zinc oxide 56.7 g, clotrimazole (LOTRIMIN) 15 g, hydrocortisone (CORTIZONE-10) 28 g compounded topical cream, , Apply Topically, Q30 Min PRN        Physical Exam:    Physical Exam  Constitutional:       General: She is not in acute distress.  HENT:       Mouth/Throat:      Mouth: Mucous membranes are moist.   Eyes:      Pupils: Pupils are equal, round, and reactive to light.   Cardiovascular:      Rate and Rhythm: Normal rate and regular rhythm.      Heart sounds: No murmur heard.  Pulmonary:      Effort: Respiratory distress present.      Breath sounds: No wheezing or rhonchi.   Abdominal:      General: Abdomen is flat.      Palpations: Abdomen is soft.   Musculoskeletal:      Cervical back: No rigidity or tenderness.   Skin:     Coloration: Skin is not jaundiced or pale.   Neurological:      General: No focal deficit present.      Mental Status: She is alert and oriented to person, place, and time. Mental status is at baseline.   Psychiatric:         Mood and Affect: Mood normal.  Labs:  Results for orders placed or performed during the hospital encounter of 02/14/24 (from the past 24 hours)   CBC/DIFF    Collection Time: 02/16/24  4:53 AM    Narrative    The following orders were created for panel order CBC/DIFF.  Procedure                               Abnormality         Status                     ---------                               -----------         ------                     CBC WITH DIFF[[id number]]                Abnormal            Final result                 Please view results for these tests on the individual orders.   COMPREHENSIVE METABOLIC PANEL, NON-FASTING    Collection Time: 02/16/24  4:53 AM   Result Value Ref Range    SODIUM 133 (L) 136 - 145 mmol/L    POTASSIUM 3.6 3.5 - 5.1 mmol/L    CHLORIDE 99 98 - 107 mmol/L    CO2 TOTAL 22 21 - 31 mmol/L    ANION GAP 12 4 - 13 mmol/L    BUN 24 7 - 25 mg/dL    CREATININE 9.15 9.39 - 1.30 mg/dL    BUN/CREA RATIO 29 (H) 6 - 22    ESTIMATED GFR 74 >59 mL/min/1.57m^2    ALBUMIN 3.8 3.5 - 5.7 g/dL    CALCIUM 9.4 8.6 - 89.6 mg/dL    GLUCOSE 866 (H) 74 - 109 mg/dL    ALKALINE PHOSPHATASE 76 34 - 104 U/L    ALT (SGPT) 7 7 - 52 U/L    AST (SGOT) 16 13 - 39 U/L    BILIRUBIN TOTAL 0.4 0.3 - 1.0 mg/dL     PROTEIN TOTAL 8.0 6.4 - 8.9 g/dL    ALBUMIN/GLOBULIN RATIO 0.9 0.8 - 1.4    OSMOLALITY, CALCULATED 272 270 - 290 mOsm/kg    CALCIUM, CORRECTED 9.6 8.9 - 10.8 mg/dL    GLOBULIN 4.2 (H) 2.0 - 3.5    Narrative    Estimated Glomerular Filtration Rate (eGFR) is calculated using the CKD-EPI (2021) equation, intended for patients 1 years of age and older. If gender is not documented or unknown, there will be no eGFR calculation.     MAGNESIUM     Collection Time: 02/16/24  4:53 AM   Result Value Ref Range    MAGNESIUM  2.0 1.9 - 2.7 mg/dL   CBC WITH DIFF    Collection Time: 02/16/24  4:53 AM   Result Value Ref Range    WBC 18.2 (H) 3.8 - 11.8 x10^3/uL    RBC 4.18 3.63 - 4.92 x10^6/uL    HGB 12.3 10.9 - 14.3 g/dL    HCT 63.8 68.7 - 58.0 %    MCV 86.4 75.5 - 95.3 fL    MCH 29.4 24.7 - 32.8 pg    MCHC 34.0  32.3 - 35.6 g/dL    RDW 83.9 87.6 - 82.2 %    PLATELETS 497 (H) 140 - 440 x10^3/uL    MPV 6.5 (L) 7.9 - 10.8 fL    NEUTROPHIL % 62 43 - 77 %    LYMPHOCYTE % 22 16 - 46 %    MONOCYTE % 9 4 - 11 %    EOSINOPHIL % 7 1 - 7 %    BASOPHIL % 0 0 - 1 %    NEUTROPHIL # 11.20 (H) 1.90 - 8.20 x10^3/uL    LYMPHOCYTE # 4.00 (H) 1.10 - 3.10 x10^3/uL    MONOCYTE # 1.60 (H) 0.20 - 0.90 x10^3/uL    EOSINOPHIL # 1.30 (H) 0.00 - 0.50 x10^3/uL    BASOPHIL # 0.00 0.00 - 0.10 x10^3/uL        Imaging:         Microbiology:  Hospital Encounter on 02/14/24 (from the past 96 hours)   ADULT ROUTINE BLOOD CULTURE, SET OF 2 ADULT BOTTLES (BACTERIA AND YEAST)    Collection Time: 02/14/24 11:44 AM    Specimen: Blood   Culture Result Status    BLOOD CULTURE, ROUTINE No Growth 2 Days Preliminary   ADULT ROUTINE BLOOD CULTURE, SET OF 2 ADULT BOTTLES (BACTERIA AND YEAST)    Collection Time: 02/14/24 11:44 AM    Specimen: Blood   Culture Result Status    BLOOD CULTURE, ROUTINE No Growth 2 Days Preliminary   MRSA SCREEN    Collection Time: 02/15/24  5:05 AM    Specimen: Swab   Culture Result Status    MRSA No Growth Preliminary   RESPIRATORY CULTURE AND GRAM  STAIN, AEROBIC    Collection Time: 02/15/24  2:24 PM    Specimen: Sputum; Other    Narrative    The following orders were created for panel order RESPIRATORY CULTURE AND GRAM STAIN, AEROBIC.  Procedure                               Abnormality         Status                     ---------                               -----------         ------                     SPUTUM SCREEN[[id number]]                                                                 Please view results for these tests on the individual orders.       Assessment/ Plan:   Assessment & Plan  CAP (community acquired pneumonia)  Acute respiratory failure  Pt presented overnight on 11/14 to our facility due to shortness of breath, worsened with minimal exertion.  - CTA of the chest was positive for bilateral ground-glass pulmonary opacity.  - Patient was started on IV Levaquin .  - procalcitonin, sputum culture, Legionella, streptococcal pneumoniae, MRSA were ordered; pending results.   -  patient on 4 L nasal cannula.  NSTEMI (non-ST elevated myocardial infarction) (CMS HCC)  - patient showed elevated troponin and BNP  - Troponin down trending.   - EKG showed LVH, but negative for new ST-T wave changes.  - patient on therapeutic Lovenox.  - started aspirin  and statins.  - lipid panel and HbA1c were ordered.  - cardiology was consulted.  D-dimer, elevated  - CT of the chest was negative for PE.  Hypomagnesemia  - magnesium  was 1.3 and was replaced.  - Will keep monitoring.  Anxiety and depression  - continue home treatment of Abilify .      Acute on chronic heart failure with preserved ejection fraction (HFpEF)  ECHO on 02/15/24 with EF: 60%. No wall motion abnormalities. No diastolic abnormalities noted. Mildly calcified aortic valve.   Hypoxia    Elevated troponin      Nutrition:    DIET REGULAR Do you want to initiate MNT Protocol? Yes    Additional clinical characteristics related to nutrition:    - monitor for weight changes   - monitor intake and  output    - monitor bowel functions                 DVT/PE Prophylaxis: Therapeutic Lovenox.     Daily Orders (From admission, onward)       Start     Ordered    02/14/24 2330  enoxaparin PF (LOVENOX) 90 mg SubQ injection (60 mg + 30 mg)  (MEDIUM RISK VTE LEVEL)  EVERY 12 HOURS        Question Answer Comment   Select Indication of Use Acute Coronary Syndrome    STEMI OR NON-STEMI NSTEMI and Unstable Angina        02/14/24 2253    02/14/24 2245  PT IS INTERMEDIATE RISK FOR VENOUS THROMBOEMBOLISM  (MEDIUM RISK VTE LEVEL)  CONTINUOUS        References:    VTE RISK ASSESSMENT TOOL    02/14/24 2243                  Anticoagulants (last 24 hours)       Date/Time Action Medication Dose    02/16/24 1126 Given    enoxaparin PF (LOVENOX) 90 mg SubQ injection (60 mg + 30 mg) 90 mg    02/15/24 2330 Given    enoxaparin PF (LOVENOX) 90 mg SubQ injection (60 mg + 30 mg) 90 mg               Disposition Planning:   Will get PT/OT.     Daneil Isle, MD    This note was partially generated using MModal Fluency Direct system, and there may be some incorrect words, spellings, and punctuation that were not noted in checking the note before saving.

## 2024-02-17 ENCOUNTER — Inpatient Hospital Stay (HOSPITAL_COMMUNITY)

## 2024-02-17 DIAGNOSIS — I5033 Acute on chronic diastolic (congestive) heart failure: Secondary | ICD-10-CM

## 2024-02-17 DIAGNOSIS — F419 Anxiety disorder, unspecified: Secondary | ICD-10-CM

## 2024-02-17 DIAGNOSIS — I358 Other nonrheumatic aortic valve disorders: Secondary | ICD-10-CM

## 2024-02-17 DIAGNOSIS — F32A Depression, unspecified: Secondary | ICD-10-CM

## 2024-02-17 DIAGNOSIS — J9600 Acute respiratory failure, unspecified whether with hypoxia or hypercapnia: Secondary | ICD-10-CM

## 2024-02-17 LAB — COMPREHENSIVE METABOLIC PANEL, NON-FASTING
ALBUMIN/GLOBULIN RATIO: 0.9 (ref 0.8–1.4)
ALBUMIN: 3.6 g/dL (ref 3.5–5.7)
ALKALINE PHOSPHATASE: 73 U/L (ref 34–104)
ALT (SGPT): 7 U/L (ref 7–52)
ANION GAP: 11 mmol/L (ref 4–13)
AST (SGOT): 14 U/L (ref 13–39)
BILIRUBIN TOTAL: 0.4 mg/dL (ref 0.3–1.0)
BUN/CREA RATIO: 27 — ABNORMAL HIGH (ref 6–22)
BUN: 19 mg/dL (ref 7–25)
CALCIUM, CORRECTED: 9.5 mg/dL (ref 8.9–10.8)
CALCIUM: 9.2 mg/dL (ref 8.6–10.3)
CHLORIDE: 99 mmol/L (ref 98–107)
CO2 TOTAL: 24 mmol/L (ref 21–31)
CREATININE: 0.7 mg/dL (ref 0.60–1.30)
ESTIMATED GFR: 92 mL/min/1.73mˆ2 (ref >59)
GLOBULIN: 3.9 — ABNORMAL HIGH (ref 2.0–3.5)
GLUCOSE: 106 mg/dL (ref 74–109)
OSMOLALITY, CALCULATED: 271 mosm/kg (ref 270–290)
POTASSIUM: 3.7 mmol/L (ref 3.5–5.1)
PROTEIN TOTAL: 7.5 g/dL (ref 6.4–8.9)
SODIUM: 134 mmol/L — ABNORMAL LOW (ref 136–145)

## 2024-02-17 LAB — CBC WITH DIFF
BASOPHIL #: 0.1 x10ˆ3/uL (ref 0.00–0.10)
BASOPHIL %: 0 % (ref 0–1)
EOSINOPHIL #: 1.5 x10ˆ3/uL — ABNORMAL HIGH (ref 0.00–0.50)
EOSINOPHIL %: 11 % — ABNORMAL HIGH (ref 1–7)
HCT: 35.6 % (ref 31.2–41.9)
HGB: 12.1 g/dL (ref 10.9–14.3)
LYMPHOCYTE #: 1.5 x10ˆ3/uL (ref 1.10–3.10)
LYMPHOCYTE %: 12 % — ABNORMAL LOW (ref 16–46)
MCH: 29.2 pg (ref 24.7–32.8)
MCHC: 33.9 g/dL (ref 32.3–35.6)
MCV: 86 fL (ref 75.5–95.3)
MONOCYTE #: 1.1 x10ˆ3/uL — ABNORMAL HIGH (ref 0.20–0.90)
MONOCYTE %: 9 % (ref 4–11)
MPV: 6.5 fL — ABNORMAL LOW (ref 7.9–10.8)
NEUTROPHIL #: 8.6 x10ˆ3/uL — ABNORMAL HIGH (ref 1.90–8.20)
NEUTROPHIL %: 67 % (ref 43–77)
PLATELETS: 464 x10ˆ3/uL — ABNORMAL HIGH (ref 140–440)
RBC: 4.14 x10ˆ6/uL (ref 3.63–4.92)
RDW: 15.9 % (ref 12.3–17.7)
WBC: 12.8 x10ˆ3/uL — ABNORMAL HIGH (ref 3.8–11.8)

## 2024-02-17 LAB — MAGNESIUM: MAGNESIUM: 1.6 mg/dL — ABNORMAL LOW (ref 1.9–2.7)

## 2024-02-17 LAB — MRSA SCREEN: MRSA: NO GROWTH

## 2024-02-17 MED ORDER — MAGNESIUM SULFATE 1 GRAM/100 ML IN DEXTROSE 5 % INTRAVENOUS PIGGYBACK
1.0000 g | INJECTION | INTRAVENOUS | Status: AC
Start: 2024-02-17 — End: 2024-02-17
  Administered 2024-02-17: 0 g via INTRAVENOUS
  Administered 2024-02-17: 1 g via INTRAVENOUS
  Filled 2024-02-17: qty 100

## 2024-02-17 MED ORDER — LEVOFLOXACIN 750 MG TABLET
750.0000 mg | ORAL_TABLET | ORAL | Status: DC
Start: 2024-02-18 — End: 2024-02-24
  Administered 2024-02-18 – 2024-02-24 (×7): 750 mg via ORAL
  Filled 2024-02-17 (×7): qty 1

## 2024-02-17 MED ORDER — ENOXAPARIN 40 MG/0.4 ML SUBCUTANEOUS SYRINGE
40.0000 mg | INJECTION | Freq: Every day | SUBCUTANEOUS | Status: DC
Start: 2024-02-17 — End: 2024-02-24
  Administered 2024-02-17: 0 mg via SUBCUTANEOUS
  Administered 2024-02-18 – 2024-02-24 (×7): 40 mg via SUBCUTANEOUS
  Filled 2024-02-17 (×7): qty 0.4

## 2024-02-17 NOTE — Assessment & Plan Note (Addendum)
-   continue home treatment of Abilify .

## 2024-02-17 NOTE — Respiratory Therapy (Signed)
 02/17/24 2006   High Flow Nasal Cannula   Start Time 2008   Order Updated in EMR Yes   Equipment Adult   HFNC Device Airvo   HFNC Status Currently On   Facial Skin Integrity WNL   H2O Bottle Check (HFNC) Checked   Circuit Checked   Temperature 31 C (87.8 F)   Flow  45 LPM   FiO2 45 %   SpO2 92   $HFNC Subsequent Charge (Resp only) HFNC-Sub     Increased flow and FIO2 to above settings due to patient having period of O2 desaturation. POX currently 92%. Breathing treatment administered via aerogen.

## 2024-02-17 NOTE — Respiratory Therapy (Signed)
 02/17/24 0818   High Flow Nasal Cannula   Start Time 0818   Order Updated in EMR Yes   Equipment Adult   HFNC Device Airvo   HFNC Status Currently On   Facial Skin Integrity WNL   H2O Bottle Check (HFNC) Checked   Circuit Checked   Temperature 31 C (87.8 F)   Flow  45 LPM   FiO2 55 %   SpO2 98   $HFNC Subsequent Charge (Resp only) HFNC-Sub   $Type of Circuit Changed (Resp only) HFNC   Stop Time 0820   Duration 2 Minutes     Pt tolerating HFNC well with SpO2 98%. Flow turned down to 45 LPM. Pt tolerating well with SpO2 99%. Care ongoing

## 2024-02-17 NOTE — Assessment & Plan Note (Signed)
 Pt presented overnight on 11/14 to our facility due to shortness of breath, worsened with minimal exertion.  - CTA of the chest was positive for bilateral ground-glass pulmonary opacity.  - Continue IV Levaquin . Can be changed to po tomorrow.   - procalcitonin, sputum culture, Legionella, streptococcal pneumoniae results are surprisingly still pending. MRSA nares negative.  - Will keep off lasix.   - 02/17/24: On my examination the patient was on HFNC 40$/40L/min however her HFNC was only partially in her nose and she was saturating at 94%. RN also present during the time of exam and adjusted her HFNC back into the correct position. Discussed with patient's RT to mobilize the patient, provided acupap and requested to attempt to wean her off the HFNC.

## 2024-02-17 NOTE — Respiratory Therapy (Signed)
 Changed pt water  bag. On the high flow.

## 2024-02-17 NOTE — Respiratory Therapy (Signed)
 02/17/24 0100   High Flow Nasal Cannula   Start Time 0128   Order Updated in EMR Yes   Equipment Adult   HFNC Device Airvo   HFNC Status Currently On   Facial Skin Integrity WNL   H2O Bottle Check (HFNC) Checked   Circuit Checked   Temperature 31 C (87.8 F)   Flow  50 LPM   FiO2 55 %  (pt sat 86 turned to 55)   SpO2 90   $HFNC Subsequent Charge (Resp only) HFNC-Sub   $Type of Circuit Changed (Resp only) HFNC   Stop Time 0133   Duration 5 Minutes

## 2024-02-17 NOTE — Assessment & Plan Note (Signed)
-   patient showed elevated troponin and BNP  - Troponin down trending.   - EKG showed LVH, but negative for new ST-T wave changes.  - patient on therapeutic Lovenox. Will d/c today.   - started aspirin  and statins.  - lipid panel and HbA1c were ordered.  - cardiology was consulted.

## 2024-02-17 NOTE — Progress Notes (Signed)
 [facility]   Progress Note    Brooke Maxwell  Date of service: 02/17/2024  Date of Admission:  02/14/2024  Hospital Day:  LOS: 3 days     Subjective: Patient continues to be on HFNC today and reports being short of breath. No chest pain or GU/GI Complaints.     Review of Systems:    Review of Systems  10 point ROS negative except mentioned above.     Objective:      Vital Signs:  Vitals:    02/17/24 0721 02/17/24 0818 02/17/24 1113 02/17/24 1530   BP: (!) 130/53   (!) 142/97   Pulse: 85  94 88   Resp: 19   19   Temp: 36.7 C (98.1 F)   36.8 C (98.3 F)   SpO2: 98% 99% 93% 94%   Weight:       Height:       BMI:                I/O:  I/O last 24 hours:    Intake/Output Summary (Last 24 hours) at 02/17/2024 1551  Last data filed at 02/17/2024 1432  Gross per 24 hour   Intake 1400 ml   Output 1925 ml   Net -525 ml       albuterol (PROVENTIL) 2.5 mg / 3 mL (0.083%) neb solution, 2.5 mg, Nebulization, Q4H PRN  ARIPiprazole  (ABILIFY ) tablet, 30 mg, Oral, Daily  aspirin  chewable tablet 81 mg, 81 mg, Oral, Daily  atorvastatin (LIPITOR) tablet, 80 mg, Oral, QPM  budesonide (PULMICORT RESPULES) 0.5 mg/2 mL nebulizer suspension, 1 mg, Nebulization, 2x/day  cyclobenzaprine  (FLEXERIL ) tablet, 10 mg, Oral, 3x/day PRN  enoxaparin PF (LOVENOX) 90 mg SubQ injection (60 mg + 30 mg), 90 mg, Subcutaneous, Q12H  ipratropium-albuterol 0.5 mg-3 mg(2.5 mg base)/3 mL Solution for Nebulization, 3 mL, Nebulization, 4x/day PRN  [START ON 02/18/2024] levoFLOXacin  (LEVAQUIN ) tablet, 750 mg, Oral, Q24H  pantoprazole  (PROTONIX ) delayed release tablet, 40 mg, Oral, Daily  perflutren lipid microspheres (DEFINITY) 1.3 mL in NS 10 mL (tot vol) injection, 2 mL, Intravenous, Cardiology Once PRN  zinc oxide 56.7 g, clotrimazole (LOTRIMIN) 15 g, hydrocortisone (CORTIZONE-10) 28 g compounded topical cream, , Apply Topically, Q30 Min PRN        Physical Exam:    Physical Exam  Constitutional:       General: She is not in acute  distress.  HENT:      Mouth/Throat:      Mouth: Mucous membranes are moist.   Eyes:      Pupils: Pupils are equal, round, and reactive to light.   Cardiovascular:      Rate and Rhythm: Normal rate and regular rhythm.      Heart sounds: No murmur heard.  Pulmonary:      Effort: Respiratory distress present.      Breath sounds: No wheezing or rhonchi.   Abdominal:      General: Abdomen is flat.      Palpations: Abdomen is soft.   Musculoskeletal:      Cervical back: No rigidity or tenderness.   Skin:     Coloration: Skin is not jaundiced or pale.   Neurological:      General: No focal deficit present.      Mental Status: She is alert and oriented to person, place, and time. Mental status is at baseline.   Psychiatric:         Mood and Affect: Mood normal.  Labs:  Results for orders placed or performed during the hospital encounter of 02/14/24 (from the past 24 hours)   CBC/DIFF    Collection Time: 02/17/24  4:20 AM    Narrative    The following orders were created for panel order CBC/DIFF.  Procedure                               Abnormality         Status                     ---------                               -----------         ------                     CBC WITH IPQQ[[id number]]                Abnormal            Final result                 Please view results for these tests on the individual orders.   COMPREHENSIVE METABOLIC PANEL, NON-FASTING    Collection Time: 02/17/24  4:20 AM   Result Value Ref Range    SODIUM 134 (L) 136 - 145 mmol/L    POTASSIUM 3.7 3.5 - 5.1 mmol/L    CHLORIDE 99 98 - 107 mmol/L    CO2 TOTAL 24 21 - 31 mmol/L    ANION GAP 11 4 - 13 mmol/L    BUN 19 7 - 25 mg/dL    CREATININE 9.29 9.39 - 1.30 mg/dL    BUN/CREA RATIO 27 (H) 6 - 22    ESTIMATED GFR 92 >59 mL/min/1.70m^2    ALBUMIN 3.6 3.5 - 5.7 g/dL    CALCIUM 9.2 8.6 - 89.6 mg/dL    GLUCOSE 893 74 - 890 mg/dL    ALKALINE PHOSPHATASE 73 34 - 104 U/L    ALT (SGPT) 7 7 - 52 U/L    AST (SGOT) 14 13 - 39 U/L    BILIRUBIN TOTAL 0.4  0.3 - 1.0 mg/dL    PROTEIN TOTAL 7.5 6.4 - 8.9 g/dL    ALBUMIN/GLOBULIN RATIO 0.9 0.8 - 1.4    OSMOLALITY, CALCULATED 271 270 - 290 mOsm/kg    CALCIUM, CORRECTED 9.5 8.9 - 10.8 mg/dL    GLOBULIN 3.9 (H) 2.0 - 3.5    Narrative    Estimated Glomerular Filtration Rate (eGFR) is calculated using the CKD-EPI (2021) equation, intended for patients 25 years of age and older. If gender is not documented or unknown, there will be no eGFR calculation.     MAGNESIUM     Collection Time: 02/17/24  4:20 AM   Result Value Ref Range    MAGNESIUM  1.6 (L) 1.9 - 2.7 mg/dL   CBC WITH DIFF    Collection Time: 02/17/24  4:20 AM   Result Value Ref Range    WBC 12.8 (H) 3.8 - 11.8 x10^3/uL    RBC 4.14 3.63 - 4.92 x10^6/uL    HGB 12.1 10.9 - 14.3 g/dL    HCT 64.3 68.7 - 58.0 %    MCV 86.0 75.5 - 95.3 fL    MCH 29.2 24.7 - 32.8 pg    MCHC 33.9  32.3 - 35.6 g/dL    RDW 84.0 87.6 - 82.2 %    PLATELETS 464 (H) 140 - 440 x10^3/uL    MPV 6.5 (L) 7.9 - 10.8 fL    NEUTROPHIL % 67 43 - 77 %    LYMPHOCYTE % 12 (L) 16 - 46 %    MONOCYTE % 9 4 - 11 %    EOSINOPHIL % 11 (H) 1 - 7 %    BASOPHIL % 0 0 - 1 %    NEUTROPHIL # 8.60 (H) 1.90 - 8.20 x10^3/uL    LYMPHOCYTE # 1.50 1.10 - 3.10 x10^3/uL    MONOCYTE # 1.10 (H) 0.20 - 0.90 x10^3/uL    EOSINOPHIL # 1.50 (H) 0.00 - 0.50 x10^3/uL    BASOPHIL # 0.10 0.00 - 0.10 x10^3/uL        Imaging:    Results for orders placed or performed during the hospital encounter of 02/14/24 (from the past 24 hours)   XR CHEST AP     Status: None    Narrative    Brooke Maxwell      PROCEDURE DESCRIPTION: XRT CHEST ONE VIEW    CLINICAL :  cannot wean patient off HFNC    COMPARISON:02/14/2024 and 09/12/2019          FINDINGS: A single view of the chest was obtained. A relatively poor inspiratory effort is noted on current examination. Bilateral predominantly interstitial infiltrates, left lung greater than right lung are again seen and appear unchanged since the most recent previous study. The heart size is within the limits of  normal.  No pleural effusion or pneumothorax is noted.         Impression    Bilateral predominantly interstitial infiltrates are again noted which appear unchanged since the most recent previous examination.        Radiologist location ID: [id number]         Microbiology:  Hospital Encounter on 02/14/24 (from the past 96 hours)   ADULT ROUTINE BLOOD CULTURE, SET OF 2 ADULT BOTTLES (BACTERIA AND YEAST)    Collection Time: 02/14/24 11:44 AM    Specimen: Blood   Culture Result Status    BLOOD CULTURE, ROUTINE No Growth 3 Days Preliminary   ADULT ROUTINE BLOOD CULTURE, SET OF 2 ADULT BOTTLES (BACTERIA AND YEAST)    Collection Time: 02/14/24 11:44 AM    Specimen: Blood   Culture Result Status    BLOOD CULTURE, ROUTINE No Growth 3 Days Preliminary   MRSA SCREEN    Collection Time: 02/15/24  5:05 AM    Specimen: Swab   Culture Result Status    MRSA No Growth Final   RESPIRATORY CULTURE AND GRAM STAIN, AEROBIC    Collection Time: 02/15/24  2:24 PM    Specimen: Sputum; Other    Narrative    The following orders were created for panel order RESPIRATORY CULTURE AND GRAM STAIN, AEROBIC.  Procedure                               Abnormality         Status                     ---------                               -----------         ------  SPUTUM SCREEN[[id number]]                                                                 Please view results for these tests on the individual orders.       Assessment/ Plan:   Assessment & Plan  CAP (community acquired pneumonia)  Acute respiratory failure  Pt presented overnight on 11/14 to our facility due to shortness of breath, worsened with minimal exertion.  - CTA of the chest was positive for bilateral ground-glass pulmonary opacity.  - Patient was started on IV Levaquin .  - procalcitonin, sputum culture, Legionella, streptococcal pneumoniae, MRSA were ordered; pending results.   - Will trial one time Lasix to see if the patient can be weaned off HFNC.   NSTEMI  (non-ST elevated myocardial infarction) (CMS HCC)  - patient showed elevated troponin and BNP  - Troponin down trending.   - EKG showed LVH, but negative for new ST-T wave changes.  - patient on therapeutic Lovenox.  - started aspirin  and statins.  - lipid panel and HbA1c were ordered.  - cardiology was consulted.  D-dimer, elevated  - CT of the chest was negative for PE.  Hypomagnesemia  - magnesium  was 1.3 and was replaced.  - Will keep monitoring.  Anxiety and depression  - continue home treatment of Abilify .      Acute on chronic heart failure with preserved ejection fraction (HFpEF)  ECHO on 02/15/24 with EF: 60%. No wall motion abnormalities. No diastolic abnormalities noted. Mildly calcified aortic valve.   Hypoxia    Elevated troponin      Nutrition:    DIET REGULAR Do you want to initiate MNT Protocol? Yes    Additional clinical characteristics related to nutrition:    - monitor for weight changes   - monitor intake and output    - monitor bowel functions                 DVT/PE Prophylaxis: Therapeutic Lovenox.     Daily Orders (From admission, onward)       Start     Ordered    02/14/24 2330  enoxaparin PF (LOVENOX) 90 mg SubQ injection (60 mg + 30 mg)  (MEDIUM RISK VTE LEVEL)  EVERY 12 HOURS        Question Answer Comment   Select Indication of Use Acute Coronary Syndrome    STEMI OR NON-STEMI NSTEMI and Unstable Angina        02/14/24 2253    02/14/24 2245  PT IS INTERMEDIATE RISK FOR VENOUS THROMBOEMBOLISM  (MEDIUM RISK VTE LEVEL)  CONTINUOUS        References:    VTE RISK ASSESSMENT TOOL    02/14/24 2243                  Anticoagulants (last 24 hours)       Date/Time Action Medication Dose    02/17/24 1148 Given    enoxaparin PF (LOVENOX) 90 mg SubQ injection (60 mg + 30 mg) 90 mg    02/17/24 0010 Given    enoxaparin PF (LOVENOX) 90 mg SubQ injection (60 mg + 30 mg) 90 mg               Disposition Planning:  Will get PT/OT.     Daneil Isle, MD    This note was partially generated using MModal  Fluency Direct system, and there may be some incorrect words, spellings, and punctuation that were not noted in checking the note before saving.

## 2024-02-17 NOTE — Assessment & Plan Note (Addendum)
 ECHO on 02/15/24 with EF: 60%. No wall motion abnormalities. No diastolic abnormalities noted. Mildly calcified aortic valve.

## 2024-02-17 NOTE — Assessment & Plan Note (Signed)
-   continue home treatment of Abilify .

## 2024-02-17 NOTE — Assessment & Plan Note (Addendum)
 Pt presented overnight on 11/14 to our facility due to shortness of breath, worsened with minimal exertion.  - CTA of the chest was positive for bilateral ground-glass pulmonary opacity.  - Patient was started on IV Levaquin .  - procalcitonin, sputum culture, Legionella, streptococcal pneumoniae, MRSA were ordered; pending results.   - Will trial one time Lasix to see if the patient can be weaned off HFNC.

## 2024-02-17 NOTE — Respiratory Therapy (Signed)
 02/17/24 1553   High Flow Nasal Cannula   Start Time 1553   Order Updated in EMR Yes   Equipment Adult   HFNC Device Airvo   HFNC Status Currently On   Facial Skin Integrity WNL   H2O Bottle Check (HFNC) Checked   Circuit Checked   Temperature 31 C (87.8 F)   Flow  35 LPM   FiO2 40 %   SpO2 90   $HFNC Subsequent Charge (Resp only) HFNC-Sub   $Type of Circuit Changed (Resp only) HFNC   Stop Time 1555   Duration 2 Minutes     FiO2 decreased to 40% due to SpO2 98%. Pt is tolerating new settings well. SpO2 92%. Care on going

## 2024-02-17 NOTE — Assessment & Plan Note (Signed)
-   CT of the chest was negative for PE.

## 2024-02-17 NOTE — Assessment & Plan Note (Signed)
-   magnesium  was 1.3 and was replaced.  - Will keep monitoring.

## 2024-02-17 NOTE — Care Plan (Signed)
 [facility]  Care Plan Note    Situation: Patient remains in room 302-B. She was admitted with acute respiratory failure and NSTEMI.    Intervention: Monitoring labs and vitals. Assisting with ADLs. On HFNC, RT titrating as tolerated. Cardiology consulted. IV Levaquin  switched to PO for pneumonia. Patient education ongoing. Discharge planning in place.    Response: Patient is tolerating current treatment well at this time.      Espiridion Console, RN        Problem: Wound  Goal: Skin Health and Integrity  Intervention: Optimize Skin Protection  Recent Flowsheet Documentation  Taken 02/17/2024 0800 by Espiridion  Pressure Reduction Techniques:   Mobility is maximized   Moisture, shear and nutrition are maximized   Frequent weight shifting encouraged  Pressure Reduction Devices: Repositioning wedges/pillows utilized     Problem: Adult Inpatient Plan of Care  Goal: Absence of Hospital-Acquired Illness or Injury  Intervention: Identify and Manage Fall Risk  Recent Flowsheet Documentation  Taken 02/17/2024 0800 by Espiridion  Safety Promotion/Fall Prevention:   activity supervised   fall prevention program maintained   motion sensor pad activated   nonskid shoes/slippers when out of bed   safety round/check completed     Problem: Adult Inpatient Plan of Care  Goal: Absence of Hospital-Acquired Illness or Injury  Intervention: Prevent and Manage VTE (Venous Thromboembolism) Risk  Recent Flowsheet Documentation  Taken 02/17/2024 0800 by Espiridion  VTE Prevention/Management:   ambulation promoted   dorsiflexion/plantar flexion performed   anticoagulant therapy maintained     Problem: Adult Inpatient Plan of Care  Goal: Optimal Comfort and Wellbeing  Intervention: Provide Person-Centered Care  Recent Flowsheet Documentation  Taken 02/17/2024 0800 by Espiridion  Trust Relationship/Rapport:   care explained   choices provided   questions answered   questions encouraged   thoughts/feelings acknowledged

## 2024-02-17 NOTE — Respiratory Therapy (Signed)
 02/17/24 1238   High Flow Nasal Cannula   Start Time 1238   Order Updated in EMR Yes   Equipment Adult   HFNC Device Airvo   HFNC Status Currently On   Facial Skin Integrity WNL   H2O Bottle Check (HFNC) Checked   Circuit Checked   Temperature 31 C (87.8 F)   Flow  35 LPM   FiO2 50 %   SpO2 97   $HFNC Subsequent Charge (Resp only) HFNC-Sub   $Type of Circuit Changed (Resp only) HFNC   Stop Time 1243   Duration 5 Minutes     Pt toleating HFNC well. Flow decreased to 35 LPM and FiO2 decreased to 50%. Pt is tolering adjustments well with SpO2 96%. Care on going

## 2024-02-17 NOTE — Assessment & Plan Note (Addendum)
-   CT of the chest was negative for PE.

## 2024-02-17 NOTE — Care Management Notes (Signed)
 [facility]  Care Management Initial Evaluation    Patient Name: Brooke Maxwell  Date of Birth: [birth year]  Sex: female  Date/Time of Admission: 02/14/2024 11:12 AM  Room/Bed: 302/B  Payor: MEDICARE / Plan: MEDICARE PART A AND B / Product Type: Medicare /   Primary Care Providers:  Pcp, No (General)    Pharmacy Info:   Preferred Pharmacy       [id number] - [place], [place] - [address]    [address] [place] [address]    Phone: [phone] Fax: [phone]    Hours: Not open 24 hours          Emergency Contact Info:   Extended Emergency Contact Information  Primary Emergency Contact: HARMON,LESLIE  Mobile Phone: [phone]  Relation: Daughter  Preferred language: English  Interpreter needed? No    History:   ADRIENE KNIPFER is a 73 y.o., female, admitted 02/14/2024    Height/Weight: 154.9 cm (5' 1) / 94.2 kg (207 lb 9.6 oz)     LOS: 3 days   Admitting Diagnosis: NSTEMI (non-ST elevated myocardial infarction) (CMS [facility]) [I21.4]    Assessment:      02/17/24 1035   Assessment Details   Assessment Type Admission   Date of Care Management Update 02/17/24   Readmission   Is this a readmission? No   Insurance Information/Type   Insurance type Medicare   Employment/Financial   Patient has Prescription Coverage?  Yes        Name of Insurance Coverage for Medications Medicare D   Financial/Environmental Concerns none   Living Environment   Lives With child(ren), adult   Living Arrangements condominium   Able to Return to Prior Arrangements yes   Living Arrangement Comments Lives with adult daughter and grandchildren   Home Safety   Home Assessment: Stairs in Home   Home Accessibility no concerns;bed not on first floor;stairs (1 railing present)   Home Safety Comments Lives in [place]   Custody and Legal Status   Do you have a court appointed guardian/conservator? No   Are you an emancipated minor? No   Custody Issues? No   Paternity Affidavit Requested?  No   Care Management Plan   CM to Do Evaluate for supplemental Oxygen Therapy   Discharge Planning Status initial meeting   Projected Discharge Date 02/19/24   Discharge plan discussed with: Patient   CM will evaluate for rehabilitation potential yes   Discharge Needs Assessment   Outpatient/Agency/Support Group Needs homecare agency   Equipment Currently Used at Home none   Discharge Facility/Level of Care Needs Home vs Home with Home Health   Transportation Available family or friend will provide   Discharge Information   Discharge Disposition home health   Referral Information   Admission Type inpatient   Arrived From home or self-care     On contact, Patient is alert and oriented.  HiFlo Oxygen infusing on contact.   Patient reported living with daughter and grandchildren at physical address [address]/[place]. Patient reported [address].    Patient reported no use of AD for ambulation prior to admission.  Denied having Bed on 2nd Level to be a problem/concern.   Patient denied use of supplemental Oxygen Therapy at home.   Patient denied Home Health/Formal Supports in home.  Patient reported independence in ability to manage self care needs prior to admission.   Patient reported self-transportation.  When unable, daughter  can provide.   [facility]  No PCP reported.    No barriers to healthcare, pharmacy or transportation access identified.   CM will continue to follow throughout admission and address DC needs accordingly.     CM monitoring for possible supplemental O2 need on discharge.   Patient's DC goal is to return to home.     Discharge Plan:  Home vs home with Home Health  Case Manager: Grayce Griffin, MSW  Phone: [phone]

## 2024-02-17 NOTE — Assessment & Plan Note (Addendum)
-   patient showed elevated troponin and BNP  - Troponin down trending.   - EKG showed LVH, but negative for new ST-T wave changes.  - patient on therapeutic Lovenox.  - started aspirin  and statins.  - lipid panel and HbA1c were ordered.  - cardiology was consulted.

## 2024-02-17 NOTE — Progress Notes (Signed)
 [facility]   Progress Note    Brooke Maxwell  Date of service: 02/17/2024  Date of Admission:  02/14/2024  Hospital Day:  LOS: 3 days     Subjective: Patient couldn't be weaned off HFNC overnight. Continues to be on HFNC today and reports being short of breath. No chest pain or GU/GI Complaints.     Review of Systems:    Review of Systems  10 point ROS negative except mentioned above.     Objective:      Vital Signs:  Vitals:    02/17/24 0721 02/17/24 0818 02/17/24 1113 02/17/24 1530   BP: (!) 130/53   (!) 142/97   Pulse: 85  94 88   Resp: 19   19   Temp: 36.7 C (98.1 F)   36.8 C (98.3 F)   SpO2: 98% 99% 93% 94%   Weight:       Height:       BMI:                I/O:  I/O last 24 hours:    Intake/Output Summary (Last 24 hours) at 02/17/2024 1553  Last data filed at 02/17/2024 1432  Gross per 24 hour   Intake 1400 ml   Output 1925 ml   Net -525 ml       albuterol (PROVENTIL) 2.5 mg / 3 mL (0.083%) neb solution, 2.5 mg, Nebulization, Q4H PRN  ARIPiprazole  (ABILIFY ) tablet, 30 mg, Oral, Daily  aspirin  chewable tablet 81 mg, 81 mg, Oral, Daily  atorvastatin (LIPITOR) tablet, 80 mg, Oral, QPM  budesonide (PULMICORT RESPULES) 0.5 mg/2 mL nebulizer suspension, 1 mg, Nebulization, 2x/day  cyclobenzaprine  (FLEXERIL ) tablet, 10 mg, Oral, 3x/day PRN  enoxaparin PF (LOVENOX) 90 mg SubQ injection (60 mg + 30 mg), 90 mg, Subcutaneous, Q12H  ipratropium-albuterol 0.5 mg-3 mg(2.5 mg base)/3 mL Solution for Nebulization, 3 mL, Nebulization, 4x/day PRN  [START ON 02/18/2024] levoFLOXacin  (LEVAQUIN ) tablet, 750 mg, Oral, Q24H  pantoprazole  (PROTONIX ) delayed release tablet, 40 mg, Oral, Daily  perflutren lipid microspheres (DEFINITY) 1.3 mL in NS 10 mL (tot vol) injection, 2 mL, Intravenous, Cardiology Once PRN  zinc oxide 56.7 g, clotrimazole (LOTRIMIN) 15 g, hydrocortisone (CORTIZONE-10) 28 g compounded topical cream, , Apply Topically, Q30 Min PRN        Physical Exam:    Physical Exam  Constitutional:        General: She is not in acute distress.  HENT:      Mouth/Throat:      Mouth: Mucous membranes are moist.   Eyes:      Pupils: Pupils are equal, round, and reactive to light.   Cardiovascular:      Rate and Rhythm: Normal rate and regular rhythm.      Heart sounds: No murmur heard.  Pulmonary:      Effort: Respiratory distress present.      Breath sounds: No wheezing or rhonchi.   Abdominal:      General: Abdomen is flat.      Palpations: Abdomen is soft.   Musculoskeletal:      Cervical back: No rigidity or tenderness.   Skin:     Coloration: Skin is not jaundiced or pale.   Neurological:      General: No focal deficit present.      Mental Status: She is alert and oriented to person, place, and time. Mental status is at baseline.   Psychiatric:         Mood  and Affect: Mood normal.         Labs:  Results for orders placed or performed during the hospital encounter of 02/14/24 (from the past 24 hours)   CBC/DIFF    Collection Time: 02/17/24  4:20 AM    Narrative    The following orders were created for panel order CBC/DIFF.  Procedure                               Abnormality         Status                     ---------                               -----------         ------                     CBC WITH IPQQ[[id number]]                Abnormal            Final result                 Please view results for these tests on the individual orders.   COMPREHENSIVE METABOLIC PANEL, NON-FASTING    Collection Time: 02/17/24  4:20 AM   Result Value Ref Range    SODIUM 134 (L) 136 - 145 mmol/L    POTASSIUM 3.7 3.5 - 5.1 mmol/L    CHLORIDE 99 98 - 107 mmol/L    CO2 TOTAL 24 21 - 31 mmol/L    ANION GAP 11 4 - 13 mmol/L    BUN 19 7 - 25 mg/dL    CREATININE 9.29 9.39 - 1.30 mg/dL    BUN/CREA RATIO 27 (H) 6 - 22    ESTIMATED GFR 92 >59 mL/min/1.86m^2    ALBUMIN 3.6 3.5 - 5.7 g/dL    CALCIUM 9.2 8.6 - 89.6 mg/dL    GLUCOSE 893 74 - 890 mg/dL    ALKALINE PHOSPHATASE 73 34 - 104 U/L    ALT (SGPT) 7 7 - 52 U/L    AST (SGOT) 14 13 - 39  U/L    BILIRUBIN TOTAL 0.4 0.3 - 1.0 mg/dL    PROTEIN TOTAL 7.5 6.4 - 8.9 g/dL    ALBUMIN/GLOBULIN RATIO 0.9 0.8 - 1.4    OSMOLALITY, CALCULATED 271 270 - 290 mOsm/kg    CALCIUM, CORRECTED 9.5 8.9 - 10.8 mg/dL    GLOBULIN 3.9 (H) 2.0 - 3.5    Narrative    Estimated Glomerular Filtration Rate (eGFR) is calculated using the CKD-EPI (2021) equation, intended for patients 57 years of age and older. If gender is not documented or unknown, there will be no eGFR calculation.     MAGNESIUM     Collection Time: 02/17/24  4:20 AM   Result Value Ref Range    MAGNESIUM  1.6 (L) 1.9 - 2.7 mg/dL   CBC WITH DIFF    Collection Time: 02/17/24  4:20 AM   Result Value Ref Range    WBC 12.8 (H) 3.8 - 11.8 x10^3/uL    RBC 4.14 3.63 - 4.92 x10^6/uL    HGB 12.1 10.9 - 14.3 g/dL    HCT 64.3 68.7 - 58.0 %    MCV 86.0 75.5 - 95.3 fL  MCH 29.2 24.7 - 32.8 pg    MCHC 33.9 32.3 - 35.6 g/dL    RDW 84.0 87.6 - 82.2 %    PLATELETS 464 (H) 140 - 440 x10^3/uL    MPV 6.5 (L) 7.9 - 10.8 fL    NEUTROPHIL % 67 43 - 77 %    LYMPHOCYTE % 12 (L) 16 - 46 %    MONOCYTE % 9 4 - 11 %    EOSINOPHIL % 11 (H) 1 - 7 %    BASOPHIL % 0 0 - 1 %    NEUTROPHIL # 8.60 (H) 1.90 - 8.20 x10^3/uL    LYMPHOCYTE # 1.50 1.10 - 3.10 x10^3/uL    MONOCYTE # 1.10 (H) 0.20 - 0.90 x10^3/uL    EOSINOPHIL # 1.50 (H) 0.00 - 0.50 x10^3/uL    BASOPHIL # 0.10 0.00 - 0.10 x10^3/uL        Imaging:    Results for orders placed or performed during the hospital encounter of 02/14/24 (from the past 24 hours)   XR CHEST AP     Status: None    Narrative    Brooke Maxwell      PROCEDURE DESCRIPTION: XRT CHEST ONE VIEW    CLINICAL :  cannot wean patient off HFNC    COMPARISON:02/14/2024 and 09/12/2019          FINDINGS: A single view of the chest was obtained. A relatively poor inspiratory effort is noted on current examination. Bilateral predominantly interstitial infiltrates, left lung greater than right lung are again seen and appear unchanged since the most recent previous study. The heart  size is within the limits of normal.  No pleural effusion or pneumothorax is noted.         Impression    Bilateral predominantly interstitial infiltrates are again noted which appear unchanged since the most recent previous examination.        Radiologist location ID: [id number]         Microbiology:  Hospital Encounter on 02/14/24 (from the past 96 hours)   ADULT ROUTINE BLOOD CULTURE, SET OF 2 ADULT BOTTLES (BACTERIA AND YEAST)    Collection Time: 02/14/24 11:44 AM    Specimen: Blood   Culture Result Status    BLOOD CULTURE, ROUTINE No Growth 3 Days Preliminary   ADULT ROUTINE BLOOD CULTURE, SET OF 2 ADULT BOTTLES (BACTERIA AND YEAST)    Collection Time: 02/14/24 11:44 AM    Specimen: Blood   Culture Result Status    BLOOD CULTURE, ROUTINE No Growth 3 Days Preliminary   MRSA SCREEN    Collection Time: 02/15/24  5:05 AM    Specimen: Swab   Culture Result Status    MRSA No Growth Final   RESPIRATORY CULTURE AND GRAM STAIN, AEROBIC    Collection Time: 02/15/24  2:24 PM    Specimen: Sputum; Other    Narrative    The following orders were created for panel order RESPIRATORY CULTURE AND GRAM STAIN, AEROBIC.  Procedure                               Abnormality         Status                     ---------                               -----------         ------  SPUTUM SCREEN[[id number]]                                                                 Please view results for these tests on the individual orders.       Assessment/ Plan:   Assessment & Plan  CAP (community acquired pneumonia)  Acute respiratory failure  Pt presented overnight on 11/14 to our facility due to shortness of breath, worsened with minimal exertion.  - CTA of the chest was positive for bilateral ground-glass pulmonary opacity.  - Continue IV Levaquin . Can be changed to po tomorrow.   - procalcitonin, sputum culture, Legionella, streptococcal pneumoniae results are surprisingly still pending. MRSA nares negative.  - Will keep  off lasix.   - 02/17/24: On my examination the patient was on HFNC 40$/40L/min however her HFNC was only partially in her nose and she was saturating at 94%. RN also present during the time of exam and adjusted her HFNC back into the correct position. Discussed with patient's RT to mobilize the patient, provided acupap and requested to attempt to wean her off the HFNC.     NSTEMI (non-ST elevated myocardial infarction) (CMS HCC)  - patient showed elevated troponin and BNP  - Troponin down trending.   - EKG showed LVH, but negative for new ST-T wave changes.  - patient on therapeutic Lovenox. Will d/c today.   - started aspirin  and statins.  - lipid panel and HbA1c were ordered.  - cardiology was consulted.  D-dimer, elevated  - CT of the chest was negative for PE.  Hypomagnesemia  - magnesium  was 1.3 and was replaced.  - Will keep monitoring.  Anxiety and depression  - continue home treatment of Abilify .      Acute on chronic heart failure with preserved ejection fraction (HFpEF)  ECHO on 02/15/24 with EF: 60%. No wall motion abnormalities. No diastolic abnormalities noted. Mildly calcified aortic valve.   Hypoxia    Elevated troponin      Nutrition:    DIET REGULAR Do you want to initiate MNT Protocol? Yes    Additional clinical characteristics related to nutrition:    - monitor for weight changes   - monitor intake and output    - monitor bowel functions                 DVT/PE Prophylaxis: Therapeutic Lovenox.  Now switched to VTE ppx Lovenox.   Daily Orders (From admission, onward)       Start     Ordered    02/14/24 2330  enoxaparin PF (LOVENOX) 90 mg SubQ injection (60 mg + 30 mg)  (MEDIUM RISK VTE LEVEL)  EVERY 12 HOURS        Question Answer Comment   Select Indication of Use Acute Coronary Syndrome    STEMI OR NON-STEMI NSTEMI and Unstable Angina        02/14/24 2253    02/14/24 2245  PT IS INTERMEDIATE RISK FOR VENOUS THROMBOEMBOLISM  (MEDIUM RISK VTE LEVEL)  CONTINUOUS        References:    VTE RISK  ASSESSMENT TOOL    02/14/24 2243                  Anticoagulants (last 24 hours)  Date/Time Action Medication Dose    02/17/24 1148 Given    enoxaparin PF (LOVENOX) 90 mg SubQ injection (60 mg + 30 mg) 90 mg    02/17/24 0010 Given    enoxaparin PF (LOVENOX) 90 mg SubQ injection (60 mg + 30 mg) 90 mg               Disposition Planning:   Will get PT/OT.     [place], MD    This note was partially generated using MModal Fluency Direct system, and there may be some incorrect words, spellings, and punctuation that were not noted in checking the note before saving.

## 2024-02-17 NOTE — Assessment & Plan Note (Addendum)
-   magnesium  was 1.3 and was replaced.  - Will keep monitoring.

## 2024-02-17 NOTE — Assessment & Plan Note (Signed)
 ECHO on 02/15/24 with EF: 60%. No wall motion abnormalities. No diastolic abnormalities noted. Mildly calcified aortic valve.

## 2024-02-18 LAB — BASIC METABOLIC PANEL
ANION GAP: 9 mmol/L (ref 4–13)
BUN/CREA RATIO: 25 — ABNORMAL HIGH (ref 6–22)
BUN: 16 mg/dL (ref 7–25)
CALCIUM: 9.5 mg/dL (ref 8.6–10.3)
CHLORIDE: 99 mmol/L (ref 98–107)
CO2 TOTAL: 27 mmol/L (ref 21–31)
CREATININE: 0.65 mg/dL (ref 0.60–1.30)
ESTIMATED GFR: 94 mL/min/1.73mˆ2 (ref >59)
OSMOLALITY, CALCULATED: 273 mosm/kg (ref 270–290)
POTASSIUM: 4.1 mmol/L (ref 3.5–5.1)
SODIUM: 135 mmol/L — ABNORMAL LOW (ref 136–145)

## 2024-02-18 LAB — CBC
HCT: 37.4 % (ref 31.2–41.9)
HGB: 12.4 g/dL (ref 10.9–14.3)
MCH: 28.6 pg (ref 24.7–32.8)
MCHC: 33 g/dL (ref 32.3–35.6)
MCV: 86.8 fL (ref 75.5–95.3)
MPV: 6.4 fL — ABNORMAL LOW (ref 7.9–10.8)
PLATELETS: 437 x10ˆ3/uL (ref 140–440)
RBC: 4.31 x10ˆ6/uL (ref 3.63–4.92)
RDW: 15.5 % (ref 12.3–17.7)
WBC: 12.1 x10ˆ3/uL — ABNORMAL HIGH (ref 3.8–11.8)

## 2024-02-18 LAB — MAGNESIUM: MAGNESIUM: 1.8 mg/dL — ABNORMAL LOW (ref 1.9–2.7)

## 2024-02-18 LAB — STREP PNEUMONIAE AND LEGIONELLA ANTIGEN, URINE
LEGIONELLA ANTIGEN: NEGATIVE
S.PNEUMONIAE ANTIGEN: NEGATIVE

## 2024-02-18 MED ORDER — AMITRIPTYLINE 25 MG TABLET
25.0000 mg | ORAL_TABLET | Freq: Every evening | ORAL | Status: DC | PRN
Start: 2024-02-18 — End: 2024-02-24

## 2024-02-18 MED ORDER — ROPINIROLE 0.5 MG TABLET
0.2500 mg | ORAL_TABLET | Freq: Every evening | ORAL | Status: DC
Start: 2024-02-18 — End: 2024-02-24
  Administered 2024-02-18 – 2024-02-23 (×6): 0.25 mg via ORAL
  Filled 2024-02-18 (×6): qty 1

## 2024-02-18 MED ORDER — BUSPIRONE 10 MG TABLET
10.0000 mg | ORAL_TABLET | Freq: Two times a day (BID) | ORAL | Status: DC
Start: 2024-02-18 — End: 2024-02-23
  Administered 2024-02-18 – 2024-02-22 (×9): 10 mg via ORAL
  Administered 2024-02-23: 0 mg via ORAL
  Filled 2024-02-18 (×10): qty 1

## 2024-02-18 MED ORDER — FLUOXETINE 20 MG CAPSULE
20.0000 mg | ORAL_CAPSULE | Freq: Every day | ORAL | Status: DC
Start: 2024-02-19 — End: 2024-02-24
  Administered 2024-02-19 – 2024-02-24 (×6): 20 mg via ORAL
  Filled 2024-02-18 (×6): qty 1

## 2024-02-18 MED ORDER — METHYLPREDNISOLONE SOD SUCCINATE 40 MG/ML SOLUTION FOR INJ. WRAPPER
40.0000 mg | Freq: Three times a day (TID) | INTRAMUSCULAR | Status: DC
Start: 2024-02-18 — End: 2024-02-21
  Administered 2024-02-18 – 2024-02-21 (×9): 40 mg via INTRAVENOUS
  Filled 2024-02-18 (×9): qty 1

## 2024-02-18 NOTE — Respiratory Therapy (Signed)
 02/18/24 0900   STOP BANG SCREENING (RT)   Do you snore loudly 0   Do you often feel tired, fatigued, or sleepy during the day 0   Has anyone observed you stop breathing during your sleep 0   Do you have or are you being treated for high BP 0   Are you obese/very overweight-BMI>35kg/m2 1   Age of 73 years old 1   Neck circumference >16 inches 0   Are you female 0   STOP BANG Total Score 2     Pt BMI is 39. Stop bang score 2, overnight pulse ox not ordered.

## 2024-02-18 NOTE — Respiratory Therapy (Signed)
 02/18/24 1253   High Flow Nasal Cannula   Start Time 1253   Order Updated in EMR Yes   Equipment Adult   HFNC Device Airvo   HFNC Status Currently On   Facial Skin Integrity WNL   H2O Bottle Check (HFNC) Checked   Circuit Checked   Temperature 31 C (87.8 F)   Flow  50 LPM   FiO2 56 %   SpO2 96   $HFNC Subsequent Charge (Resp only) HFNC-Sub   Stop Time 1257   Duration 4 Minutes     No changes made to HFNC. PT resting comfortably with adequate ventilation.

## 2024-02-18 NOTE — Progress Notes (Signed)
 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  Reviewed CT imaging does show some inflammatory changes concerning for pneumonitis.  Patient did report exposure to Clorox bleach recently before her symptoms started      Intake & Output:    Intake/Output Summary (Last 24 hours) at 02/18/2024 1531  Last data filed at 02/18/2024 1200  Gross per 24 hour   Intake 460 ml   Output 400 ml   Net 60 ml       Vital Signs:  Filed Vitals:    02/18/24 0051 02/18/24 0354 02/18/24 0800 02/18/24 1023   BP: 108/62 133/75 116/75    Pulse: 78 74 78 89   Resp: 18 16 17     Temp: 37 C (98.6 F) 36.4 C (97.5 F) 36.6 C (97.9 F)    SpO2: 97% 93% 98%         albuterol (PROVENTIL) 2.5 mg / 3 mL (0.083%) neb solution, 2.5 mg, Nebulization, Q4H PRN  ARIPiprazole  (ABILIFY ) tablet, 30 mg, Oral, Daily  aspirin  chewable tablet 81 mg, 81 mg, Oral, Daily  atorvastatin (LIPITOR) tablet, 80 mg, Oral, QPM  budesonide (PULMICORT RESPULES) 0.5 mg/2 mL nebulizer suspension, 1 mg, Nebulization, 2x/day  cyclobenzaprine  (FLEXERIL ) tablet, 10 mg, Oral, 3x/day PRN  enoxaparin PF (LOVENOX) 40 mg/0.4 mL SubQ injection, 40 mg, Subcutaneous, Daily  ipratropium-albuterol 0.5 mg-3 mg(2.5 mg base)/3 mL Solution for Nebulization, 3 mL, Nebulization, 4x/day PRN  levoFLOXacin  (LEVAQUIN ) tablet, 750 mg, Oral, Q24H  methylPREDNISolone  sod succ (SOLU-medrol ) 40 mg/mL injection, 40 mg, Intravenous, Q8H  pantoprazole  (PROTONIX ) delayed release tablet, 40 mg, Oral, Daily  perflutren lipid microspheres (DEFINITY) 1.3 mL in NS 10 mL (tot vol) injection, 2 mL, Intravenous, Cardiology Once PRN  zinc oxide 56.7 g, clotrimazole  (LOTRIMIN) 15 g, hydrocortisone (CORTIZONE-10) 28 g compounded topical cream, , Apply Topically, Q30 Min PRN           Assessment/ Plan:     # possible pneumonitis   Did have 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  Community-acquired pneumonia   Infectious workup has been negative so far   We will complete the course of levofloxacin     #Demand ischemia   Likely secondary from the respiratory distress  Troponins elevated and downtrending   No ST segment or T-wave changes in the EKG  Cardiology was consulted    #Elevated D-dimer   CT chest negative for any PE   Could be secondary from inflammation     #history of anxiety and depression   Continue home medication      Disposition Planning:  Pending clinical course      IVF:  None  Diet:  Regular  Gastric Ulcer Prophylaxis:  Pantoprazole    Bowel Regimen:  None    DVT/PE Prophylaxis :  Lovenox  Daily Orders (From admission,  onward)       Start     Ordered    02/14/24 2245  PT IS INTERMEDIATE RISK FOR VENOUS THROMBOEMBOLISM  (MEDIUM RISK VTE LEVEL)  CONTINUOUS        References:    VTE RISK ASSESSMENT TOOL    02/14/24 2243                   Lines/Drains:         On the day of the encounter, a total of 25 minutes was spent on this patient encounter including review of historical information, examination, documentation and post-visit activities. The time documented excludes procedural time and time spent on wellness portion of the visit.     Zailey Audia [name], MD  02/18/2024  [facility] MEDICINE HOSPITALIST

## 2024-02-18 NOTE — Respiratory Therapy (Signed)
 02/18/24 0900   High Flow Nasal Cannula   Start Time 0909   Order Updated in EMR Yes   Equipment Adult   HFNC Device Airvo   HFNC Status Currently On   Facial Skin Integrity WNL   H2O Bottle Check (HFNC) Checked   Circuit Checked   Temperature 31 C (87.8 F)   Flow  45 LPM   FiO2 35 %   SpO2 97   $HFNC Subsequent Charge (Resp only) HFNC-Sub   $Type of Circuit Changed (Resp only) HFNC   Stop Time 0910   Duration 1 Minutes     Pt FiO2 decreased to 35% due to SpO2 98%. Pt is tolerating new setting well with SpO2 96%. Care on going

## 2024-02-18 NOTE — Respiratory Therapy (Signed)
 02/18/24 1000   High Flow Nasal Cannula   Start Time 1000   Order Updated in EMR Yes   Equipment Adult   HFNC Device Airvo   HFNC Status Currently On   Facial Skin Integrity WNL   H2O Bottle Check (HFNC) Checked   Circuit Checked   Temperature 31 C (87.8 F)   Flow  50 LPM   FiO2 55 %   SpO2 93   $HFNC Subsequent Charge (Resp only) HFNC-Sub   $Type of Circuit Changed (Resp only) HFNC   Stop Time 1006   Duration 6 Minutes     Pt FiO2 and flow increased to settings above due to O2 dropping. Pt SpO2 93% with new settings. Care on going

## 2024-02-18 NOTE — PT Evaluation (Signed)
 .. [facility]  [address]  [place], [address]  [phone]  (Fax) [phone]  Rehabilitation Services  Physical Therapy Inpatient Initial Evaluation    Patient Name: Brooke Maxwell  Date of Birth: [birth year]  Height: Height: 154.9 cm (5' 1)  Weight: Weight: 94.2 kg (207 lb 9.6 oz)  Room/Bed: 302/B  Payor: MEDICARE / Plan: MEDICARE PART A AND B / Product Type: Medicare /       PMH:  Past Medical History:   Diagnosis Date    Arthropathy     Bipolar disorder, unspecified     Depression     Kidney stone     PUD (peptic ulcer disease)     Wears dentures            Assessment:      (P) Pt is a 73-YO female ADM for NSTEMI, hx of pneumonia, heart failure, bipolar disorder. Pt presents with mild generalized weakness and is on HFNC today. Pt can tolerate about 1-3 mins of standing/gait activities prior to dropping below 91% while on current HFNC setting. No unsteadiness noted during limited gait drills. Pt required BUE support while trying to transfer from bedside to chair by cruising along the chair. RPE is 5/10. Follow for transfer and gait strengthening. Hopefully pt can be titrated to just O2 NC so we can see how much she could walk. Can DC to IRF if breathing improves.    Total Distance Ambulated: (P) 5  Independence: (P) contact guard assist, stand-by assistance  Assistive Device: (P) walker, front wheeled      Discharge Needs:    Equipment Recommendation: (P) four wheeled walker      The patient presents with mobility limitations due to impaired balance and impaired functional activity tolerance that significantly impair/prevent patient's ability to participate in mobility-related activities of daily living (MRADLs) including  ambulation and transfers in order to safely complete, safely entering/exiting the home, in reasonable time. This functional mobility deficit can be sufficiently resolved with the use of a (P) four wheeled walker  in order to decrease the risk of  falls, morbidity, and mortality in performance of these MRADLs.  Patient is able to safely use this assistive device.    Discharge Disposition: (P) inpatient rehabilitation facility, home with assist    JUSTIFICATION OF DISCHARGE RECOMMENDATION   Based on current diagnosis, functional performance prior to admission, and current functional performance, this patient requires continued PT services in (P) inpatient rehabilitation facility, home with assist in order to achieve significant functional improvements in these deficit areas: (P) aerobic capacity/endurance, gait, locomotion, and balance.        Plan:   Current Intervention: (P) balance training, gait training, home exercise program, patient/family education, postural re-education, strengthening, transfer training  To provide physical therapy services (P) 1x/day, 2x/day, minimum of 1x/week  for duration of (P) until discharge.    The risks/benefits of therapy have been discussed with the patient/caregiver and he/she is in agreement with the established plan of care.       Subjective & Objective     Past Medical History:   Diagnosis Date    Arthropathy     Bipolar disorder, unspecified     Depression     Kidney stone     PUD (peptic ulcer disease)     Wears dentures             Past Surgical History:   Procedure Laterality Date    ABDOMINAL HERNIA  REPAIR      ABDOMINAL HYSTERECTOMY      ANKLE SURGERY Right     COLECTOMY PARTIAL / TOTAL      HX BREAST BIOPSY Left     HX CARPAL TUNNEL RELEASE Bilateral     HX CYST REMOVAL      Cyst removed from neck    LITHOTRIPSY                   02/18/24 1505   Rehab Session   Document Type evaluation   PT Visit Date 02/18/24   General Information   Patient Profile Reviewed yes   Medical Lines PIV Line;Telemetry   Respiratory Status high flow nasal cannula   Existing Precautions/Restrictions fall precautions;full code;oxygen therapy device and L/min   Mutuality/Individual Preferences   Anxieties, Fears or Concerns SOB    Individualized Care Needs up to [facility] adjacent to bed   Patient-Specific Goals (Include Timeframe) DC to home when clear   Plan of Care Reviewed With patient   Living Environment   Lives With child(ren), adult;grandchild(ren)   Living Arrangements house   Home Assessment: Stairs in Home   Home Accessibility bed and bath on same level;stairs to enter home;stairs within home;tub/shower is not walk in   Stairs Within Home, Primary   Stairs, Within Home, Primary 15   Home Main Entrance   Number of Stairs, Main Entrance eight   Functional Level Prior   Ambulation 0 - independent   Transferring 0 - independent   Toileting 0 - independent   Bathing 0 - independent   Dressing 0 - independent   Eating 0 - independent   Communication 0 - understands/communicates without difficulty   Prior Functional Level Comment Pt is indep with amb and ADLs without use of assist device, on room air, drives and a community-dweller   Pre Treatment Status   Pre Treatment Patient Status Patient supine in bed;Call light within reach;Patient safety alarm activated;Nurse approved session   Support Present Pre Treatment  None   Communication Pre Treatment  Nurse   Communication Pre Treatment Comment Clear   Cognitive Assessment/Interventions   Behavior/Mood Observations alert;behavior appropriate to situation, WNL/WFL;cooperative   Orientation Status oriented x 4   Attention WNL/WFL   Follows Commands WFL   Pre- Treatment Vital Signs   Pre-Treatment Heart Rate (beats/min) 95   Pre SpO2 (%) 95   O2 Delivery Pre Treatment room air   Pre-Treatment Pain   Pretreatment Pain Rating 0/10 - no pain   RLE Assessment   RLE Assessment WFL- Within Functional Limits   LLE Assessment   LLE Assessment WFL- Within Functional Limits   Trunk Assessment   Trunk Assessment WFL-Within Functional Limits   Mobility Assessment/Training   Additional Documentation Bed Mobility Assessment/Treatment (Group);Transfer Assessment/Treatment (Group);Gait Assessment/Treatment  (Group)   Bed Mobility   Scoot/Bridge Independence stand-by assistance;supervision required   Supine-Sit Independence stand-by assistance   Bed Mobility, Assistive Device bed rails;Head of Bed Elevated   Impairments balance impaired;endurance   Transfer Assessment/Treatment   Sit-Stand Independence stand-by assistance;supervision required   Stand-Sit Independence supervision required   Sit-Stand-Sit, Assist Device walker, front wheeled   Bed-Chair Independence stand-by assistance   Bed-Chair-Bed Assist Device walker, front wheeled   Transfer Safety Issues balance decreased during turns;step length decreased   Transfer Impairments balance impaired;endurance   Gait Assessment/Treatment   Total Distance Ambulated 5   Independence  contact guard assist;stand-by assistance   Assistive Device  walker, front wheeled   Distance in  Feet limited due to HFNC machine   Gait Speed slow   Deviations  cadence decreased;double stance time increased;limb motion velocity decreased;step length decreased   Safety Issues  balance decreased during turns;step length decreased   Impairments  balance impaired;endurance   Motor Skills/Interventions   Additional Documentation Balance Skills Training (Group)   Balance   Sitting Balance: Static good balance   Sitting, Dynamic (Balance) fair + balance   Sit-to-Stand Balance fair balance   Standing Balance: Static fair balance   Standing Balance: Dynamic fair - balance   Post Treatment Status   Post Treatment Patient Status Patient sitting in bedside chair or w/c;Call light within reach;Patient safety alarm activated;Other (See comments)  (legs up)   Support Present Post Treatment  None   Patient Effort good   Post-Treatment Vital Signs   Post-treatment Heart Rate (beats/min) 92   Post SpO2 (%) 93   O2 Delivery Post Treatment supplemental O2   Post-Treatment Pain   Posttreatment Pain Rating 0/10 - no pain   Physical Therapy Clinical Impression   Assessment Pt is a 73-YO female ADM for NSTEMI, hx  of pneumonia, heart failure, bipolar disorder. Pt presents with mild generalized weakness and is on HFNC today. Pt can tolerate about 1-3 mins of standing/gait activities prior to dropping below 91% while on current HFNC setting. No unsteadiness noted during limited gait drills. Pt required BUE support while trying to transfer from bedside to chair by cruising along the chair. RPE is 5/10. Follow for transfer and gait strengthening. Hopefully pt can be titrated to just O2 NC so we can see how much she could walk. Can DC to IRF if breathing improves.   Criteria for Skilled Therapeutic yes;meets criteria;skilled treatment is necessary   Impairments Found (describe specific impairments) aerobic capacity/endurance;gait, locomotion, and balance   Functional Limitations in Following  self-care;home management   Rehab Potential good   Therapy Frequency 1x/day;2x/day;minimum of 1x/week   Predicted Duration of Therapy Intervention (days/wks) until discharge   Anticipated Equipment Needs at Discharge (PT) four wheeled walker   Anticipated Discharge Disposition inpatient rehabilitation facility;home with assist   Evaluation Complexity Justification   Patient History: Co-morbidity/factors that impact Plan of Care 3 or more that impact Plan of Care;One or more other medical co-morbidity;Home accessibility barriers/factors   Examination Components Vital signs (BP, HR, RR, or O2 saturation);Range of motion;Strength;Balance;Bed mobility;Transfers;Ambulation   Presentation Stable: Uncomplicated, straight-forward, problem focused   Clinical Decision Making Low complexity   Evaluation Complexity Low complexity   Care Plan Goals   PT Rehab Goals Transfer Training Goal;Gait Training Goal   Planned Therapy Interventions, PT Eval   Planned Therapy Interventions (PT) balance training;gait training;home exercise program;patient/family education;postural re-education;strengthening;transfer training   Transfer Training Goal   Transfer  Training Goal, Date Established 02/18/24   Transfer Training Goal, Time to Achieve by discharge   Transfer Training Goal, Activity Type sit-to-stand/stand-to-sit;bed-to-chair/chair-to-bed   Transfer Training Goal, Current Status stand-by assistance   Transfer Training Goal, Independence Level modified independence;set up required   Transfer Training Goal, Assist Device least restrictive assistive device   Gait Training  Goal, Distance to Achieve   Gait Training  Goal, Date Established 02/18/24   Gait Training  Goal, Time to Achieve by discharge   Gait Training  Goal, Current Status stand-by assistance;contact guard assist   Gait Training  Goal, Independence Level set up required;modified independence   Gait Training  Goal, Assist Device walker, rolling   Gait Training  Goal, Distance to Achieve 150  Gait Training  Goal, Additional Goal standing pause break only   Physical Therapy Time and Intention   Total PT Minutes: 25               INTERVENTION MINUTES: EVALUATION 15 minutes and THERAPEUTIC ACTIVITY 10 minutes    EVALUATION COMPLEXITY : CLINICAL DECISION MAKING OF LOW COMPLEXITY AS INDICATED BY PMH, PHYSICAL THERAPY ASSESSMENT OF MUSCULOSKELETAL AND NEUROLOGICAL SYSTEMS AND ACTIVITY LIMITATIONS. CLINICAL PRESENTATION IS STABLE AND UNCOMPLICATED    Therapist:     Mertie Haslem, PT  02/18/2024, 17:28

## 2024-02-18 NOTE — Respiratory Therapy (Signed)
 02/18/24 1554   High Flow Nasal Cannula   Start Time 1554   Order Updated in EMR Yes   Equipment Adult   HFNC Device Airvo   HFNC Status Currently On   Facial Skin Integrity WNL   H2O Bottle Check (HFNC) Checked   Circuit Checked   Temperature 31 C (87.8 F)   Flow  50 LPM   FiO2 46 %   SpO2 95   $HFNC Subsequent Charge (Resp only) HFNC-Sub   $Type of Circuit Changed (Resp only) HFNC   Stop Time 1602   Duration 8 Minutes     Pt FiO2 decreased to setting above due to SpO2 97%. Pt tolerating new settings well with SpO2 95%. Care on going

## 2024-02-18 NOTE — OT Evaluation (Signed)
 [facility] Medicine [facility]  [address]  [place], [address]  [phone]  (Fax) [phone]  Rehabilitation Services  Occupational Therapy Inpatient Initial Evaluation      Patient Name: Brooke Maxwell  Date of Birth: [birth year]  Height: Height: 154.9 cm (5' 1)  Weight: Weight: 94.2 kg (207 lb 9.6 oz)  Room/Bed: 302/B  Payor: MEDICARE / Plan: MEDICARE PART A AND B / Product Type: Medicare /         PMH:   Past Medical History:   Diagnosis Date    Arthropathy     Bipolar disorder, unspecified     Depression     Kidney stone     PUD (peptic ulcer disease)     Wears dentures            Assessment:      Functional Level at Time of Session: (P) Patient is a pleasant 73 year old female admitted for NSTEMI. Prior to admission, she was independent in ADLs, IADLs and functional mobility. During OT eval, patient was alert, oriented x4, cooperative and able to follow multistep commands. She has WFL BUE AROM and fair plus (3+/5) BUE strength. She was Mod I to transfer from supine-sit-supine. She required SBA with LB dressing due to SOB. Patient's O2 noted to drop to 74 with simple effort. O2 increased to 84 at rest and patient was noted to be in distress. CN notified and at bedside. OT to follow up during acute stay for ADL training.    Discharge Needs:   Equipment Recommendation:  TBD    The patient presents with mobility limitations due to impaired functional activity tolerance that significantly impair/prevent patient's ability to participate in mobility-related activities of daily living (MRADLs) including  ambulation and transfers in order to safely complete, toileting, bathing, safely entering/exiting the home, in reasonable time. This functional mobility deficit can be sufficiently resolved with the use of a Anticipated Equipment Needs at Discharge: to be determined  in order to decrease the risk of falls, morbidity, and mortality in performance of these MRADLs.  Patient is able to safely  use this assistive device.    Discharge Disposition:  home with assist    JUSTIFICATION OF DISCHARGE RECOMMENDATION   Based on current diagnosis, functional performance prior to admission, and current functional performance, this patient requires continued OT services in Anticipated Discharge Disposition: home with assist, home with home health  in order to achieve significant functional improvements.    Plan:   Current Intervention:  Predicted Duration of Therapy: until discharge    To provide Occupational therapy services  Therapy Frequency: minimum of 1x/week for duration of Predicted Duration of Therapy: until discharge  .       The risks/benefits of therapy have been discussed with the patient/caregiver and he/she is in agreement with the established plan of care.       Subjective & Objective     MEDICAL HISTORY:   Past Medical History:   Diagnosis Date    Arthropathy     Bipolar disorder, unspecified     Depression     Kidney stone     PUD (peptic ulcer disease)     Wears dentures          SURGICAL HISTORY:   Past Surgical History:   Procedure Laterality Date    ABDOMINAL HERNIA REPAIR      ABDOMINAL HYSTERECTOMY      ANKLE SURGERY Right     COLECTOMY PARTIAL /  TOTAL      HX BREAST BIOPSY Left     HX CARPAL TUNNEL RELEASE Bilateral     HX CYST REMOVAL      Cyst removed from neck    LITHOTRIPSY         ipp    INSERT FLOW SHEET     02/18/24 0943   Rehab Session   Document Type evaluation   OT Visit Date 02/18/24   Total OT Minutes: 10   Patient Effort adequate   Symptoms Noted During/After Treatment shortness of breath   General Information   Patient Profile Reviewed yes   Medical Lines Telemetry;PIV Line   Respiratory Status high flow nasal cannula   Pre Treatment Status   Pre Treatment Patient Status Patient supine in bed;Call light within reach;Telephone within reach;Patient safety alarm activated;Nurse approved session   Support Present Pre Treatment  None   Communication Pre Treatment  Charge Nurse    Communication Pre Treatment Comment Cleared for OT   Mutuality/Individual Preferences   Anxieties, Fears or Concerns SOB with activity   Individualized Care Needs Assistance with ADLs, monitoring vitals   Patient-Specific Goals (Include Timeframe) Feel better   Plan of Care Reviewed With patient   Living Environment   Lives With child(ren), adult;grandchild(ren)   Living Arrangements house  (Town house)   Home Accessibility stairs to enter home;stairs within home;tub/shower is not walk in   Home Main Entrance   Number of Stairs, Main Entrance eight   Stairs Within Home, Primary   Stairs, Within Home, Primary 15   Functional Level Prior   Ambulation 0 - independent   Transferring 0 - independent   Toileting 0 - independent   Bathing 0 - independent   Dressing 0 - independent   Eating 0 - independent   Communication 0 - understands/communicates without difficulty   Swallowing 0-->swallows foods/liquids without difficulty   Vital Signs   Pre-Treatment Heart Rate (beats/min) 92   Post-treatment Heart Rate (beats/min) 90   Pre SpO2 (%) 95   O2 Delivery Pre Treatment supplemental O2   Post SpO2 (%) 84   O2 Delivery Post Treatment supplemental O2   Coping/Psychosocial   Observed Emotional State calm;cooperative   Verbalized Emotional State acceptance   Cognition   Behavior/Mood Observations behavior appropriate to situation, WNL/WFL   Orientation Status oriented x 4   Attention WNL/WFL   Follows Commands WFL   RUE Assessment   RUE Assessment WFL- Within Functional Limits   LUE Assessment   LUE Assessment WFL- Within Functional Limits   Grip Strength   Grip Left (3+/5) fair plus, left   Right Grip (3+/5) fair plus, right   Bed Mobility   Supine-Sit Independence modified independence   Sit to Supine, Independence modified independence   Bed Mobility, Assistive Device bed rails;Head of Bed Elevated   Lower Body Dressing Assessment/Training   Position sitting   DRESSING ASSESSED Don Socks;Doff Socks   Independence Level   standby assist   Impairments activity tolerance impaired  (SOB)   Post Treatment Status   Post Treatment Patient Status Patient supine in bed;Call light within reach;Telephone within reach;Patient safety alarm activated   Support Present Post Treatment  None   Communication Post Treatement Charge Nurse   Communication Post Treatment Comment Updated about patient's O2 level   Care Plan Goals   OT Rehab Goals Bathing Goal;Grooming Goal;LB Dressing Goal;Toileting Goal;UB Dressing Goal   Bathing Goal   Bathing Goal, Date Established 02/18/24   Bathing Goal,  Time to Achieve by discharge   Bathing Goal, Activity Type all bathing tasks   Bathing Goal, Independence Level modified independence   Grooming Goal   Grooming Goal, Date Established 02/18/24   Grooming Goal, Time to Achieve by discharge   Grooming Goal, Activity Type all grooming tasks   Grooming Goal, Independence  modified independence   LB Dressing Goal   LB Dressing Goal, Date Established 02/18/24   LB Dressing Goal, Time to Achieve by discharge   LB Dressing Goal, Activity Type all lower body dressing tasks   LB Dressing Goal, Independence Level modified independence   Toileting Goal   Toileting Goal, Date Established 02/18/24   Toileting Goal, Time to Achieve by discharge   Toileting Goal, Activity Type all toileting tasks   UB Dressing Goal   UB Dressing  Goal, Date Established 02/18/24   UB Dressing Goal, Time to Achieve by discharge   UB Dressing Goal, Activity Type all upper body dressing tasks   UB Dressing Goal, Independence Level independent   Planned Therapy Interventions, OT Eval   Planned Therapy Interventions ADL retraining;endurance training   Clinical Impression   Functional Level at Time of Session Patient is a pleasant 73 year old female admitted for NSTEMI. Prior to admission, she was independent in ADLs, IADLs and functional mobility. During OT eval, patient was alert, oriented x4, cooperative and able to follow multistep commands. She has  WFL BUE AROM and fair plus (3+/5) BUE strength. She was Mod I to transfer from supine-sit-supine. She required SBA with LB dressing due to SOB. Patient's O2 noted to drop to 74 with simple effort. O2 increased to 84 at rest and patient was noted to be in distress. CN notified and at bedside. OT to follow up during acute stay for ADL training.   Criteria for Skilled Therapeutic Interventions Met (OT) yes;meets criteria;skilled treatment is necessary   Rehab Potential good   Therapy Frequency minimum of 1x/week   Predicted Duration of Therapy until discharge   Anticipated Equipment Needs at Discharge to be determined   Anticipated Discharge Disposition home with assist;home with home health   Evaluation Complexity Justification   Occupational Profile Review Brief history   Performance Deficits 1-3 deficits   Clinical Decision Making Low analytic complexity   Evaluation Complexity Low       TREATMENT PLAN: THERAPEUTIC EXERCISES, THERAPEUTIC ACTIVITIES, and ADL/IADL TRAINING  EVALUATION COMPLEXITY: CLINICAL DECISION MAKING OF LOW COMPLEXITY AS INDICATED BY PMH, OCCUPATIONAL THERAPY ASSESSMENT OF MUSCULOSKELETAL AND NEUROLOGICAL SYSTEMS AND ACTIVITY LIMITATIONS. CLINICAL PRESENTATION IS STABLE AND UNCOMPLICATED.      EVALUATION 10 minutes    Therapist:      Johnathyn Viscomi, OT,02/18/2024 10:26

## 2024-02-19 ENCOUNTER — Inpatient Hospital Stay (HOSPITAL_COMMUNITY)

## 2024-02-19 LAB — CBC
HCT: 36.9 % (ref 31.2–41.9)
HGB: 12.5 g/dL (ref 10.9–14.3)
MCH: 29.2 pg (ref 24.7–32.8)
MCHC: 33.8 g/dL (ref 32.3–35.6)
MCV: 86.2 fL (ref 75.5–95.3)
MPV: 6.5 fL — ABNORMAL LOW (ref 7.9–10.8)
PLATELETS: 462 x10ˆ3/uL — ABNORMAL HIGH (ref 140–440)
RBC: 4.28 x10ˆ6/uL (ref 3.63–4.92)
RDW: 16 % (ref 12.3–17.7)
WBC: 10.9 x10ˆ3/uL (ref 3.8–11.8)

## 2024-02-19 LAB — MAGNESIUM: MAGNESIUM: 1.7 mg/dL — ABNORMAL LOW (ref 1.9–2.7)

## 2024-02-19 LAB — BASIC METABOLIC PANEL
ANION GAP: 7 mmol/L (ref 4–13)
BUN/CREA RATIO: 26 — ABNORMAL HIGH (ref 6–22)
BUN: 16 mg/dL (ref 7–25)
CALCIUM: 9.5 mg/dL (ref 8.6–10.3)
CHLORIDE: 99 mmol/L (ref 98–107)
CO2 TOTAL: 27 mmol/L (ref 21–31)
CREATININE: 0.62 mg/dL (ref 0.60–1.30)
ESTIMATED GFR: 95 mL/min/1.73mˆ2 (ref >59)
GLUCOSE: 129 mg/dL — ABNORMAL HIGH (ref 74–109)
OSMOLALITY, CALCULATED: 269 mosm/kg — ABNORMAL LOW (ref 270–290)
POTASSIUM: 4.3 mmol/L (ref 3.5–5.1)
SODIUM: 133 mmol/L — ABNORMAL LOW (ref 136–145)

## 2024-02-19 LAB — ADULT ROUTINE BLOOD CULTURE, SET OF 2 BOTTLES (BACTERIA AND YEAST)
BLOOD CULTURE, ROUTINE: NO GROWTH
BLOOD CULTURE, ROUTINE: NO GROWTH

## 2024-02-19 MED ORDER — ACETYLCYSTEINE 200 MG/ML (20 %) SOLUTION (RT USE CONFIG)
4.0000 mL | Freq: Three times a day (TID) | Status: DC
Start: 2024-02-19 — End: 2024-02-24
  Administered 2024-02-19 – 2024-02-21 (×7): 4 mL via RESPIRATORY_TRACT
  Administered 2024-02-22: 0 mL via RESPIRATORY_TRACT
  Administered 2024-02-22 (×2): 4 mL via RESPIRATORY_TRACT
  Administered 2024-02-23: 0 mL via RESPIRATORY_TRACT
  Administered 2024-02-23: 4 mL via RESPIRATORY_TRACT
  Administered 2024-02-23 – 2024-02-24 (×2): 0 mL via RESPIRATORY_TRACT
  Administered 2024-02-24: 4 mL via RESPIRATORY_TRACT
  Administered 2024-02-24: 0 mL via RESPIRATORY_TRACT
  Filled 2024-02-19 (×5): qty 4

## 2024-02-19 MED ORDER — IPRATROPIUM 0.5 MG-ALBUTEROL 3 MG (2.5 MG BASE)/3 ML NEBULIZATION SOLN
3.0000 mL | INHALATION_SOLUTION | Freq: Four times a day (QID) | RESPIRATORY_TRACT | Status: DC
Start: 2024-02-19 — End: 2024-02-24
  Administered 2024-02-19: 0 mL via RESPIRATORY_TRACT
  Administered 2024-02-19 – 2024-02-20 (×2): 3 mL via RESPIRATORY_TRACT
  Administered 2024-02-20: 0 mL via RESPIRATORY_TRACT
  Administered 2024-02-20 – 2024-02-21 (×6): 3 mL via RESPIRATORY_TRACT
  Administered 2024-02-21: 0 mL via RESPIRATORY_TRACT
  Administered 2024-02-22 – 2024-02-24 (×9): 3 mL via RESPIRATORY_TRACT
  Administered 2024-02-24: 0 mL via RESPIRATORY_TRACT
  Administered 2024-02-24: 3 mL via RESPIRATORY_TRACT
  Filled 2024-02-19 (×3): qty 3

## 2024-02-19 NOTE — Respiratory Therapy (Signed)
 02/19/24 0236   High Flow Nasal Cannula   Order Updated in EMR Yes   Equipment Adult   HFNC Device Airvo   HFNC Status Currently On   H2O Bottle Check (HFNC) Checked   Circuit Checked   Temperature 31 C (87.8 F)   Flow  55 LPM   FiO2 55 %   SpO2 93   $HFNC Subsequent Charge (Resp only) HFNC-Sub   $Type of Circuit Changed (Resp only) HFNC   Stop Time 0237   Duration 3 Minutes

## 2024-02-19 NOTE — Care Plan (Signed)
 Pt admitted with NSTEMI. Pt is on HFNC 55 L, 55 %. Pt up with 2 assist. Turn q2h & incont care. Fall prevention protocol maintained. Bed alarm set with non-slip socks and call light in reach. Education is ongoing. Discharge planning is in place.   Problem: Wound  Goal: Optimal Coping  Outcome: Ongoing (see interventions/notes)  Intervention: Support Patient and Family Response  Recent Flowsheet Documentation  Taken 02/18/2024 2130 by Maryelizabeth PARAS, RN  Family/Support System Care: support provided  Supportive Measures: active listening utilized  Goal: Optimal Functional Ability  Outcome: Ongoing (see interventions/notes)  Intervention: Optimize Functional Ability  Recent Flowsheet Documentation  Taken 02/18/2024 2130 by Maryelizabeth PARAS, RN  Activity Management: ROM, active encouraged  Activity Assistance Provided: assistance, 2 people  Goal: Absence of Infection Signs and Symptoms  Outcome: Ongoing (see interventions/notes)  Intervention: Prevent or Manage Infection  Recent Flowsheet Documentation  Taken 02/18/2024 2130 by Maryelizabeth PARAS, RN  Fever Reduction/Comfort Measures: lightweight bedding  Goal: Improved Oral Intake  Outcome: Ongoing (see interventions/notes)  Goal: Optimal Pain Control and Function  Outcome: Ongoing (see interventions/notes)  Intervention: Prevent or Manage Pain  Recent Flowsheet Documentation  Taken 02/18/2024 2130 by Maryelizabeth PARAS, RN  Sleep/Rest Enhancement: awakenings minimized  Goal: Skin Health and Integrity  Outcome: Ongoing (see interventions/notes)  Intervention: Optimize Skin Protection  Recent Flowsheet Documentation  Taken 02/18/2024 2130 by Maryelizabeth PARAS, RN  Pressure Reduction Techniques:   Patient turned q 2 hours   Mobility is maximized   Moisture, shear and nutrition are maximized   Frequent weight shifting encouraged  Pressure Reduction Devices:   Heel offloading device utilized   Repositioning wedges/pillows utilized  Activity Management: ROM, active encouraged  Head of Bed (HOB) Positioning: HOB  at 45 degrees  Goal: Optimal Wound Healing  Outcome: Ongoing (see interventions/notes)  Intervention: Promote Wound Healing  Recent Flowsheet Documentation  Taken 02/18/2024 2130 by Maryelizabeth PARAS, RN  Pressure Reduction Techniques:   Patient turned q 2 hours   Mobility is maximized   Moisture, shear and nutrition are maximized   Frequent weight shifting encouraged  Pressure Reduction Devices:   Heel offloading device utilized   Repositioning wedges/pillows utilized  Sleep/Rest Enhancement: awakenings minimized  Activity Management: ROM, active encouraged     Problem: Adult Inpatient Plan of Care  Goal: Absence of Hospital-Acquired Illness or Injury  Outcome: Ongoing (see interventions/notes)  Intervention: Identify and Manage Fall Risk  Recent Flowsheet Documentation  Taken 02/18/2024 2130 by Maryelizabeth PARAS, RN  Safety Promotion/Fall Prevention:   activity supervised   fall prevention program maintained   nonskid shoes/slippers when out of bed   safety round/check completed  Intervention: Prevent Skin Injury  Recent Flowsheet Documentation  Taken 02/18/2024 2130 by Maryelizabeth PARAS, RN  Body Position: supine, head elevated  Skin Protection:   adhesive use limited   antimicrobial wipes   incontinence pads utilized   skin-to-device areas padded   skin-to-skin areas padded   transparent dressing maintained   tubing/devices free from skin contact  Intervention: Prevent and Manage VTE (Venous Thromboembolism) Risk  Recent Flowsheet Documentation  Taken 02/18/2024 2130 by Maryelizabeth PARAS, RN  VTE Prevention/Management:   ambulation promoted   dorsiflexion/plantar flexion performed  Intervention: Prevent Infection  Recent Flowsheet Documentation  Taken 02/18/2024 2130 by Maryelizabeth PARAS, RN  Infection Prevention: rest/sleep promoted  Goal: Optimal Comfort and Wellbeing  Outcome: Ongoing (see interventions/notes)  Intervention: Provide Person-Centered Care  Recent Flowsheet Documentation  Taken 02/18/2024 2130 by Maryelizabeth PARAS, RN  Trust  Relationship/Rapport:   care explained   emotional support provided   choices provided   empathic listening provided   questions answered   questions encouraged   reassurance provided   thoughts/feelings acknowledged  Goal: Rounds/Family Conference  Outcome: Ongoing (see interventions/notes)     Problem: Gas Exchange Impaired  Goal: Optimal Gas Exchange  Outcome: Ongoing (see interventions/notes)  Intervention: Optimize Oxygenation and Ventilation  Recent Flowsheet Documentation  Taken 02/18/2024 2130 by Maryelizabeth PARAS, RN  Head of Bed [facility]) Positioning: HOB at 45 degrees     Problem: Pneumonia  Goal: Fluid Balance  Outcome: Ongoing (see interventions/notes)  Goal: Absence of Infection Signs and Symptoms  Outcome: Ongoing (see interventions/notes)  Intervention: Prevent Infection Progression  Recent Flowsheet Documentation  Taken 02/18/2024 2130 by Maryelizabeth PARAS, RN  Fever Reduction/Comfort Measures: lightweight bedding  Goal: Effective Oxygenation and Ventilation  Outcome: Ongoing (see interventions/notes)  Intervention: Optimize Oxygenation and Ventilation  Recent Flowsheet Documentation  Taken 02/18/2024 2130 by Maryelizabeth PARAS, RN  Head of Bed [facility]) Positioning: HOB at 45 degrees     Problem: Adult Inpatient Plan of Care  Goal: Absence of Hospital-Acquired Illness or Injury  Outcome: Ongoing (see interventions/notes)  Intervention: Identify and Manage Fall Risk  Recent Flowsheet Documentation  Taken 02/18/2024 2130 by Maryelizabeth PARAS, RN  Safety Promotion/Fall Prevention:   activity supervised   fall prevention program maintained   nonskid shoes/slippers when out of bed   safety round/check completed  Intervention: Prevent Skin Injury  Recent Flowsheet Documentation  Taken 02/18/2024 2130 by Maryelizabeth PARAS, RN  Body Position: supine, head elevated  Skin Protection:   adhesive use limited   antimicrobial wipes   incontinence pads utilized   skin-to-device areas padded   skin-to-skin areas padded   transparent dressing maintained    tubing/devices free from skin contact  Intervention: Prevent and Manage VTE (Venous Thromboembolism) Risk  Recent Flowsheet Documentation  Taken 02/18/2024 2130 by Maryelizabeth PARAS, RN  VTE Prevention/Management:   ambulation promoted   dorsiflexion/plantar flexion performed  Intervention: Prevent Infection  Recent Flowsheet Documentation  Taken 02/18/2024 2130 by Maryelizabeth PARAS, RN  Infection Prevention: rest/sleep promoted  Goal: Optimal Comfort and Wellbeing  Outcome: Ongoing (see interventions/notes)  Intervention: Provide Person-Centered Care  Recent Flowsheet Documentation  Taken 02/18/2024 2130 by Maryelizabeth PARAS, RN  Trust Relationship/Rapport:   care explained   emotional support provided   choices provided   empathic listening provided   questions answered   questions encouraged   reassurance provided   thoughts/feelings acknowledged  Goal: Rounds/Family Conference  Outcome: Ongoing (see interventions/notes)     Problem: Fall Injury Risk  Goal: Absence of Fall and Fall-Related Injury  Outcome: Ongoing (see interventions/notes)  Intervention: Identify and Manage Contributors  Recent Flowsheet Documentation  Taken 02/18/2024 2130 by Maryelizabeth PARAS, RN  Medication Review/Management: medications reviewed  Intervention: Promote Injury-Free Environment  Recent Flowsheet Documentation  Taken 02/18/2024 2130 by Maryelizabeth PARAS, RN  Safety Promotion/Fall Prevention:   activity supervised   fall prevention program maintained   nonskid shoes/slippers when out of bed   safety round/check completed     Problem: Electrolyte Imbalance  Goal: Electrolyte Balance  Outcome: Ongoing (see interventions/notes)     Problem: Skin Injury Risk Increased  Goal: Skin Health and Integrity  Outcome: Ongoing (see interventions/notes)  Intervention: Optimize Skin Protection  Recent Flowsheet Documentation  Taken 02/18/2024 2130 by Maryelizabeth PARAS, RN  Pressure Reduction Techniques:   Patient turned q 2 hours   Mobility is maximized   Moisture,  shear and nutrition are  maximized   Frequent weight shifting encouraged  Pressure Reduction Devices:   Heel offloading device utilized   Repositioning wedges/pillows utilized  Skin Protection:   adhesive use limited   antimicrobial wipes   incontinence pads utilized   skin-to-device areas padded   skin-to-skin areas padded   transparent dressing maintained   tubing/devices free from skin contact  Activity Management: ROM, active encouraged  Head of Bed (HOB) Positioning: HOB at 45 degrees     Problem: Discharge Needs Assessment  Goal: Discharge Needs Assessment  Outcome: Ongoing (see interventions/notes)

## 2024-02-19 NOTE — Respiratory Therapy (Signed)
 02/19/24 1531   High Flow Nasal Cannula   Start Time 1531   Order Updated in EMR Yes   Equipment Adult   HFNC Device Airvo   HFNC Status Currently On   Facial Skin Integrity WNL   H2O Bottle Check (HFNC) Checked   Circuit Checked   Temperature 31 C (87.8 F)   Flow  55 LPM   FiO2 50 %   SpO2 93   $HFNC Subsequent Charge (Resp only) HFNC-Sub   $Type of Circuit Changed (Resp only) HFNC   Stop Time 1536   Duration 5 Minutes     No changes made to HFNC at this time. Care on going

## 2024-02-19 NOTE — Care Management Notes (Signed)
 Patient remains on HiFlo Oxygen this am  Continues to be alert and oriented.   CM will continue to follow and address DC needs accordingly.     CM monitoring need for possible O2 on discharge.

## 2024-02-19 NOTE — Respiratory Therapy (Signed)
 02/19/24 1217   High Flow Nasal Cannula   Start Time 1217   Order Updated in EMR Yes   Equipment Adult   HFNC Device Airvo   HFNC Status Currently On   Facial Skin Integrity WNL   H2O Bottle Check (HFNC) Checked   Circuit Checked   Temperature 31 C (87.8 F)   Flow  55 LPM   FiO2 50 %   SpO2 96   $HFNC Subsequent Charge (Resp only) HFNC-Sub   $Type of Circuit Changed (Resp only) HFNC   Stop Time 1225   Duration 8 Minutes     Pt FiO2 decreased to 50% due to SpO2 96%. Pt tolerating new settings well with SpO2 of 96%. Care on going

## 2024-02-19 NOTE — PT Treatment (Signed)
 [facility] Medicine [facility]  [address]  [place], [address]  [phone]  (Fax) [phone]  Rehabilitation Department  Physical Therapy Daily Inpatient Note    Date: 02/19/2024  Patient's Name: Brooke Maxwell  Date of Birth: [birth year]  Height: Height: 154.9 cm (5' 1)  Weight: Weight: 94.2 kg (207 lb 9.6 oz)      Plan: Will continue under current POC.      Discharge Disposition: (P) skilled nursing facility, TBD, home with assist, home with home health        Subjective/Objective/Assessment:  Flowsheet    02/19/24 1300   Rehab Session   Document Type therapy progress note (daily note)   PT Visit Date 02/19/24   Total PT Minutes: 23   Patient Effort good   General Information   Patient Profile Reviewed yes   Medical Lines PIV Line;Telemetry   Respiratory Status high flow nasal cannula   Existing Precautions/Restrictions full code;fall precautions   Pre Treatment Status   Pre Treatment Patient Status Patient sitting in bedside chair or w/c;Call light within reach;Patient safety alarm activated;Nurse approved session   Support Present Pre Treatment  None   Cognition   Behavior/Mood Observations behavior appropriate to situation, WNL/WFL   Vital Signs   Pre-Treatment Heart Rate (beats/min) 89   Post-treatment Heart Rate (beats/min) 78   Pre SpO2 (%) 94   O2 Delivery Pre Treatment supplemental O2   Post SpO2 (%) 90   O2 Delivery Post Treatment supplemental O2   Pain Assessment   Pretreatment Pain Rating 0/10 - no pain   Posttreatment Pain Rating 7/10   Pre/Posttreatment Pain Comment Right knee   Transfer Assessment/Treatment   Sit-Stand Independence contact guard assist   Stand-Sit Independence contact guard assist   Sit-Stand-Sit, Assist Device walker, front wheeled   Gait Assessment/Treatment   Total Distance Ambulated 8   Independence  minimum assist (75% patient effort);1 person + 1 person to manage equipment   Assistive Device  walker, front wheeled   Balance   Sitting Balance: Static  good balance   Sitting, Dynamic (Balance) fair + balance   Sit-to-Stand Balance fair balance   Standing Balance: Static fair balance   Standing Balance: Dynamic fair - balance   Post Treatment Status   Post Treatment Patient Status Patient sitting in bedside chair or w/c;Call light within reach;Patient safety alarm activated   Support Present Post Treatment  None   Physical Therapy Clinical Impression   Assessment Patient stands with CGA from vascular chair. She gaits forward and backward with RW and min assist.  She sits to rest. Oxygen SATS drop as low as 86%. She takes several minutes to rest. She stands to gait again, takes a few steps, then marches in place a few times then sits to rest. SATS drop to 81%, and requires over one minute to get back to 90%.   She needs cues for purse lip breathing.  Patient is limited on distance at this time secondary to being on high flow oxygen and SATS decreasing.   Anticipated Discharge Disposition skilled nursing facility;TBD;home with assist;home with home health               Goals:                set up required, modified independence  walker, rolling  150  standing pause break only         sit-to-stand/stand-to-sit, bed-to-chair/chair-to-bed  modified independence, set up required  least restrictive assistive  device                     Intervention minutes: GAIT TRAINING [address] MINUTES    THERAPIST  Blythe Veach, PTA  02/19/2024, 15:27

## 2024-02-19 NOTE — Respiratory Therapy (Signed)
 02/18/24 2116   High Flow Nasal Cannula   Order Updated in EMR Yes   Equipment Adult   HFNC Device Airvo   HFNC Status Currently On   Facial Skin Integrity WNL   H2O Bottle Check (HFNC) Changed   Circuit Checked   Temperature 31 C (87.8 F)   Flow  55 LPM   FiO2 55 %   SpO2 91   $HFNC Subsequent Charge (Resp only) HFNC-Sub   $Type of Circuit Changed (Resp only) HFNC   Stop Time 2121   Duration 5 Minutes     Increased flow from 50 to 55 lpm and FIO2 from 45 to 55 due to low O2 saturations.

## 2024-02-19 NOTE — Respiratory Therapy (Signed)
 02/19/24 2015   Respiratory   Respiratory WDL WDL   Additional Documentation Accupap;Aerosol Therapy Group;HFNC (Group)   Cough Frequency infrequent   Cough Type dry   Breath Sounds   L General Breath Sounds Diminished   R General Breath Sounds Diminished   High Flow Nasal Cannula   Start Time 2019   Order Updated in EMR Yes   Equipment Adult   HFNC Device Airvo   HFNC Status Currently On   Facial Skin Integrity WNL   H2O Bottle Check (HFNC) Checked   Circuit Checked   Temperature 31 C (87.8 F)   Flow  55 LPM   FiO2 50 %   SpO2 93   $HFNC Subsequent Charge (Resp only) HFNC-Sub   Aerosol Therapy (SVN)   Start Time 2015   Treatment Status Given   Daily Review of Necessity (SVN) completed   Respiratory Treatment Status (SVN) given   Route (Aerosol Therapy) aerogen   $ Respiratory Treatment (Resp only) Neb (Sub)   Medications DuoNeb (albuterol -atrovent);Mucomyst  (Acetylcysteine );Pulmicort  (Budesonide )   Patient Position Sitting   Posttreatment Assessment (SVN) increased aeration   Signs of Intolerance (SVN) none   Respiratory Pre/Post-Treatment Assess   Pre-Treatment Heart Rate (beats/min) 84   Pre-Treatment Resp Rate (breaths/min) 16   Post-treatment Heart Rate (beats/min) 84   Device (Oxygen Therapy) heated;high-flow nasal cannula   Flow (L/min) (Oxygen Therapy) 55   Oxygen Concentration (%) 50   AccuPAP   Start Time 2024   $AccuPAP S   Status  G   Review of Necessity  completed   With Medication? No   Route  mouth piece   Breaths  20 breaths   Patient Position Sitting in a chair   Posttreatment Assessment  increased aeration   Signs of Intolerance  none     Pt tolerated treatment and therapy well

## 2024-02-19 NOTE — Nurses Notes (Signed)
 2152- Pt requesting home medications. This nurse verified home medications with pt. Charge nurse & provider notified. Home meds restarted at this time.

## 2024-02-19 NOTE — Progress Notes (Signed)
 [place] MEDICINE [facility]    HOSPITALIST PROGRESS NOTE    Brooke Maxwell  Date of service: 02/19/2024  Date of Admission:  02/14/2024  Hospital Day:  LOS: 5 days     Interval History:   Patient was seen and evaluated at bedside   Continues to be on high-flow nasal cannula       Intake & Output:    Intake/Output Summary (Last 24 hours) at 02/19/2024 1157  Last data filed at 02/19/2024 1142  Gross per 24 hour   Intake 1480 ml   Output 700 ml   Net 780 ml       Vital Signs:  Filed Vitals:    02/19/24 0727 02/19/24 0800 02/19/24 0953 02/19/24 1142   BP: 136/89   137/79   Pulse: 84  92 94   Resp: 18   20   Temp: 36.7 C (98.1 F)   36.7 C (98 F)   SpO2:  94% 96%         albuterol  (PROVENTIL ) 2.5 mg / 3 mL (0.083%) neb solution, 2.5 mg, Nebulization, Q4H PRN  amitriptyline  (ELAVIL ) tablet, 25 mg, Oral, HS PRN  ARIPiprazole  (ABILIFY ) tablet, 30 mg, Oral, Daily  aspirin  chewable tablet 81 mg, 81 mg, Oral, Daily  atorvastatin  (LIPITOR) tablet, 80 mg, Oral, QPM  budesonide  (PULMICORT  RESPULES) 0.5 mg/2 mL nebulizer suspension, 1 mg, Nebulization, 2x/day  busPIRone  (BUSPAR ) tablet, 10 mg, Oral, 2x/day  cyclobenzaprine  (FLEXERIL ) tablet, 10 mg, Oral, 3x/day PRN  enoxaparin  PF (LOVENOX ) 40 mg/0.4 mL SubQ injection, 40 mg, Subcutaneous, Daily  FLUoxetine  (PROzac ) capsule, 20 mg, Oral, Daily  ipratropium-albuterol  0.5 mg-3 mg(2.5 mg base)/3 mL Solution for Nebulization, 3 mL, Nebulization, 4x/day PRN  levoFLOXacin  (LEVAQUIN ) tablet, 750 mg, Oral, Q24H  methylPREDNISolone  sod succ (SOLU-medrol ) 40 mg/mL injection, 40 mg, Intravenous, Q8H  pantoprazole  (PROTONIX ) delayed release tablet, 40 mg, Oral, Daily  perflutren  lipid microspheres (DEFINITY ) 1.3 mL in NS 10 mL (tot vol) injection, 2 mL, Intravenous, Cardiology Once PRN  rOPINIRole  (REQUIP ) tablet, 0.25 mg, Oral, NIGHTLY  zinc  oxide 56.7 g, clotrimazole (LOTRIMIN) 15 g, hydrocortisone (CORTIZONE-10) 28 g compounded topical cream, , Apply Topically, Q30 Min  PRN           Assessment/ Plan:     # possible pneumonitis   Did have exposure to strong fumes of bleach while she was cleaning  We will start her on IV steroids and see if she responds to that      #acute hypoxemic respiratory failure   could be from the above   Currently requiring high-flow nasal cannula      #?  Community-acquired pneumonia   Infectious workup has been negative so far   We will complete the course of levofloxacin      #Demand ischemia   Likely secondary from the respiratory distress  Troponins elevated and downtrending   No ST segment or T-wave changes in the EKG  Cardiology was consulted     #Elevated D-dimer   CT chest negative for any PE   Could be secondary from inflammation      #history of anxiety and depression   Continue home medication        Disposition Planning:  Pending clinical course        IVF:  None  Diet:  Regular  Gastric Ulcer Prophylaxis:  Pantoprazole    Bowel Regimen:  None    DVT/PE Prophylaxis :  Lovenox    Daily Orders (From admission, onward)  Start     Ordered    02/14/24 2245  PT IS INTERMEDIATE RISK FOR VENOUS THROMBOEMBOLISM  (MEDIUM RISK VTE LEVEL)  CONTINUOUS        References:    VTE RISK ASSESSMENT TOOL    02/14/24 2243                   Lines/Drains:         On the day of the encounter, a total of 25 minutes was spent on this patient encounter including review of historical information, examination, documentation and post-visit activities. The time documented excludes procedural time and time spent on wellness portion of the visit.     Brooke Maxwell Brooke Meals, MD  02/19/2024  [facility] MEDICINE HOSPITALIST

## 2024-02-20 LAB — CBC
HCT: 36.6 % (ref 31.2–41.9)
HGB: 12.3 g/dL (ref 10.9–14.3)
MCH: 28.9 pg (ref 24.7–32.8)
MCHC: 33.5 g/dL (ref 32.3–35.6)
MCV: 86.3 fL (ref 75.5–95.3)
MPV: 6.7 fL — ABNORMAL LOW (ref 7.9–10.8)
PLATELETS: 482 x10ˆ3/uL — ABNORMAL HIGH (ref 140–440)
RBC: 4.24 x10ˆ6/uL (ref 3.63–4.92)
RDW: 15.8 % (ref 12.3–17.7)
WBC: 14.8 x10ˆ3/uL — ABNORMAL HIGH (ref 3.8–11.8)

## 2024-02-20 LAB — BASIC METABOLIC PANEL
ANION GAP: 10 mmol/L (ref 4–13)
BUN/CREA RATIO: 29 — ABNORMAL HIGH (ref 6–22)
BUN: 17 mg/dL (ref 7–25)
CALCIUM: 9.5 mg/dL (ref 8.6–10.3)
CHLORIDE: 99 mmol/L (ref 98–107)
CO2 TOTAL: 25 mmol/L (ref 21–31)
CREATININE: 0.59 mg/dL — ABNORMAL LOW (ref 0.60–1.30)
ESTIMATED GFR: 96 mL/min/1.73mˆ2 (ref >59)
GLUCOSE: 127 mg/dL — ABNORMAL HIGH (ref 74–109)
OSMOLALITY, CALCULATED: 271 mosm/kg (ref 270–290)
POTASSIUM: 4.6 mmol/L (ref 3.5–5.1)
SODIUM: 134 mmol/L — ABNORMAL LOW (ref 136–145)

## 2024-02-20 LAB — MAGNESIUM: MAGNESIUM: 1.7 mg/dL — ABNORMAL LOW (ref 1.9–2.7)

## 2024-02-20 MED ORDER — ALPRAZOLAM 0.5 MG TABLET
0.5000 mg | ORAL_TABLET | Freq: Every day | ORAL | Status: DC | PRN
Start: 2024-02-20 — End: 2024-02-23
  Administered 2024-02-20 – 2024-02-23 (×4): 0.5 mg via ORAL
  Filled 2024-02-20 (×4): qty 1

## 2024-02-20 MED ORDER — FUROSEMIDE 10 MG/ML INJECTION SOLUTION
40.0000 mg | Freq: Once | INTRAMUSCULAR | Status: AC
Start: 2024-02-20 — End: 2024-02-20
  Administered 2024-02-20: 40 mg via INTRAVENOUS
  Filled 2024-02-20: qty 4

## 2024-02-20 NOTE — Progress Notes (Signed)
 [place] MEDICINE [facility]    HOSPITALIST PROGRESS NOTE    Brooke Maxwell  Date of service: 02/20/2024  Date of Admission:  02/14/2024  Hospital Day:  LOS: 6 days     Interval History:   Patient was seen and evaluated at bedside   Continues to be on high-flow nasal cannula     Intake & Output:    Intake/Output Summary (Last 24 hours) at 02/20/2024 1503  Last data filed at 02/20/2024 1143  Gross per 24 hour   Intake 940 ml   Output 450 ml   Net 490 ml       Vital Signs:  Filed Vitals:    02/20/24 0958 02/20/24 1150 02/20/24 1200 02/20/24 1335   BP:  (!) 146/86     Pulse: (!) 106 (!) 115     Resp:  20     Temp:  36.8 C (98.3 F)     SpO2: 96% 92% 93% 95%        acetylcysteine  (MUCOMYST ) 20% nebulized solution, 4 mL, Nebulization, 3x/day  albuterol  (PROVENTIL ) 2.5 mg / 3 mL (0.083%) neb solution, 2.5 mg, Nebulization, Q4H PRN  amitriptyline  (ELAVIL ) tablet, 25 mg, Oral, HS PRN  ARIPiprazole  (ABILIFY ) tablet, 30 mg, Oral, Daily  aspirin  chewable tablet 81 mg, 81 mg, Oral, Daily  atorvastatin  (LIPITOR) tablet, 80 mg, Oral, QPM  budesonide  (PULMICORT  RESPULES) 0.5 mg/2 mL nebulizer suspension, 1 mg, Nebulization, 2x/day  busPIRone  (BUSPAR ) tablet, 10 mg, Oral, 2x/day  cyclobenzaprine  (FLEXERIL ) tablet, 10 mg, Oral, 3x/day PRN  enoxaparin  PF (LOVENOX ) 40 mg/0.4 mL SubQ injection, 40 mg, Subcutaneous, Daily  FLUoxetine  (PROzac ) capsule, 20 mg, Oral, Daily  ipratropium-albuterol  0.5 mg-3 mg(2.5 mg base)/3 mL Solution for Nebulization, 3 mL, Nebulization, 4x/day  levoFLOXacin  (LEVAQUIN ) tablet, 750 mg, Oral, Q24H  methylPREDNISolone  sod succ (SOLU-medrol ) 40 mg/mL injection, 40 mg, Intravenous, Q8H  pantoprazole  (PROTONIX ) delayed release tablet, 40 mg, Oral, Daily  perflutren  lipid microspheres (DEFINITY ) 1.3 mL in NS 10 mL (tot vol) injection, 2 mL, Intravenous, Cardiology Once PRN  rOPINIRole  (REQUIP ) tablet, 0.25 mg, Oral, NIGHTLY  zinc  oxide 56.7 g, clotrimazole (LOTRIMIN) 15 g, hydrocortisone  (CORTIZONE-10) 28 g compounded topical cream, , Apply Topically, Q30 Min PRN           Assessment/ Plan:     # possible pneumonitis   Did have exposure to strong fumes of bleach while she was cleaning  We will start her on IV steroids and see if she responds to that      #acute hypoxemic respiratory failure   could be from the above   Currently requiring high-flow nasal cannula      #?  Community-acquired pneumonia   Infectious workup has been negative so far   We will complete the course of levofloxacin      #Demand ischemia   Likely secondary from the respiratory distress  Troponins elevated and downtrending   No ST segment or T-wave changes in the EKG  Cardiology was consulted     #Elevated D-dimer   CT chest negative for any PE   Could be secondary from inflammation      #history of anxiety and depression   Continue home medication        Disposition Planning:  Pending clinical course        IVF:  None  Diet:  Regular  Gastric Ulcer Prophylaxis:  Pantoprazole    Bowel Regimen:  None    DVT/PE Prophylaxis :  Lovenox    Daily Orders (  From admission, onward)       Start     Ordered    02/14/24 2245  PT IS INTERMEDIATE RISK FOR VENOUS THROMBOEMBOLISM  (MEDIUM RISK VTE LEVEL)  CONTINUOUS        References:    VTE RISK ASSESSMENT TOOL    02/14/24 2243                   Lines/Drains:         On the day of the encounter, a total of 25 minutes was spent on this patient encounter including review of historical information, examination, documentation and post-visit activities. The time documented excludes procedural time and time spent on wellness portion of the visit.     Bristyl Mclees [name], MD  02/20/2024  [facility] MEDICINE HOSPITALIST

## 2024-02-20 NOTE — Respiratory Therapy (Signed)
 Patient titrated off HFNC onto 4 lpm NC. Tolerating well. No shortness of breath noted. Pox now 96.

## 2024-02-20 NOTE — Respiratory Therapy (Signed)
 02/20/24 2348   AccuPAP   $AccuPAP S   Status  NG   Reason Not Given Other (Comment)  (Patient sleeping.)

## 2024-02-20 NOTE — Nurses Notes (Addendum)
 Pt. Complaining of spamsing and pan in her lower extremities. Pt. Requesting her PRN medication at this time. Medication to be administered and reassessment to follow.    1305 - Pt. Advised that her legs are feeling much better at this time.

## 2024-02-20 NOTE — Nurses Notes (Signed)
 [facility] Medicine [facility]  [address]  [place], [address]  [phone]  (Fax) [phone]  Rehabilitation Department  Physical Therapy    Restorative Aide Note    Date: 02/20/2024  Patient's Name: Brooke Maxwell  Date of Birth: 21-Apr-1950  Height: Height: 154.9 cm (5' 1)  Weight: Weight: 94.2 kg (207 lb 9.6 oz)      Total minutes: 20    Patient effort: GOOD    Patient profile reviewed: yes    Medical Lines: TELEMETRY    Respiratory Status:AIR VO    Existing precautions/restrictions:FALL PRECAUTIONS    Pre-treatment status: PATIENT SUPINE IN BED    Communication pre-treatment: CHARGE NURSE    Attention/Behavior: WNL/WFL    Pre-treatment HR: 100    Post-treatment HR: 104    PreSpO2: 95    O2 delivery pre-treatment: air vo    Post SpO2: 88    Pre-treatment pain rating: 0/10    Post-treatment pain rating: 0/10    Location of pain: n/a    Bed mobility (roll, bridge, scoot, supine to sit): self to roll and scoot    Sitting assistance:  SBA/CGA    Sit to stand: SBA/CGA    Gait: MINIMUM ASSIST ambulated patient at bedside, on air vo, 2 steps forward and 2 steps back , over to the vascular chair , on air vo after sitting patient ,oxygen saturation dropped to 69%, took a while for oxygen saturation  to come up to 88%, patient had some anxiety at that time,patient became short of breath at this time / nurse is aware        Post treatment status: IN VASCULAR CHAIR, NEEDS IN REACH, BED ALARMED          Eleanor FORBES Blazer, PAT CARE [facility]  02/20/2024, 11:57

## 2024-02-20 NOTE — Respiratory Therapy (Signed)
 02/20/24 0130   High Flow Nasal Cannula   Start Time 0130   Order Updated in EMR Yes   Equipment Adult   HFNC Device Airvo   HFNC Status Currently On   Facial Skin Integrity WNL   H2O Bottle Check (HFNC) Checked   Circuit Checked   Temperature 31 C (87.8 F)   Flow  55 LPM   FiO2 50 %   SpO2 91   $HFNC Subsequent Charge (Resp only) HFNC-Sub     Pt asleep

## 2024-02-20 NOTE — Respiratory Therapy (Signed)
 02/20/24 2000   Respiratory   Respiratory WDL WDL   Cough Frequency infrequent   Breath Sounds   LUL Breath Sounds Anterior:;diminished   RUL Breath Sounds Anterior:;diminished   RT Oxygen Therapy   $ O2 Delivery NC   Flow (L/min) (RT) 4.5   SpO2 91 %   Aerosol Therapy (SVN)   Start Time 2000   Treatment Status Given   Respiratory Treatment Status (SVN) given   Route (Aerosol Therapy) mask   $ Respiratory Treatment (Resp only) Neb (Sub)   Medications DuoNeb (albuterol -atrovent);Mucomyst  (Acetylcysteine );Pulmicort  (Budesonide )   Patient Position HOB elevated   Signs of Intolerance (SVN) none   Stop Time 2018   Duration (minutes) 18 minutes   Patient Position (SVN) HOB elevated   Respiratory Pre/Post-Treatment Assess   Pre-Treatment Heart Rate (beats/min) 115   Pre-Treatment Resp Rate (breaths/min) 20   Post-treatment Heart Rate (beats/min) 122   Post-treatment Resp Rate (breaths/min) 19   Device (Oxygen Therapy) nasal cannula   Breath Sounds Post-Respiratory Treatment   Throughout All Fields Post Treatment LUL;RUL   Breath Sounds Posttreatment LUL no change   Breath Sounds Posttreatment RUL no change   AccuPAP   Start Time 2018   $AccuPAP S   Status  G   Route  mouth piece   Breaths  20 breaths   Patient Position Semi-Fowler's   Posttreatment Assessment  breath sounds unchanged   Signs of Intolerance  none   Cough  Weak, Unable to Expectorate   Stop Time 2025   Duration (minutes) 7 minutes     Patient states that patient feels anxious when they get up and move.

## 2024-02-20 NOTE — Care Plan (Signed)
 Pt admitted with NSTEMI. Pt is on 55L 50% HFNC. Pt up with 2 assist. Fall prevention protocol maintained. Bed alarm set with non-slip socks and call light in reach. Education is ongoing. Discharge planning is in place.   Problem: Adult Inpatient Plan of Care  Goal: Absence of Hospital-Acquired Illness or Injury  Outcome: Ongoing (see interventions/notes)  Intervention: Identify and Manage Fall Risk  Recent Flowsheet Documentation  Taken 02/19/2024 2139 by [place] ORN, RN  Safety Promotion/Fall Prevention:   activity supervised   safety round/check completed   nonskid shoes/slippers when out of bed   fall prevention program maintained  Intervention: Prevent Skin Injury  Recent Flowsheet Documentation  Taken 02/19/2024 2139 by [place] ORN, RN  Body Position: supine, head elevated  Intervention: Prevent and Manage VTE (Venous Thromboembolism) Risk  Recent Flowsheet Documentation  Taken 02/19/2024 2139 by [place] ORN, RN  VTE Prevention/Management:   ambulation promoted   dorsiflexion/plantar flexion performed  Goal: Optimal Comfort and Wellbeing  Outcome: Ongoing (see interventions/notes)  Intervention: Provide Person-Centered Care  Recent Flowsheet Documentation  Taken 02/19/2024 2139 by [place] ORN, RN  Trust Relationship/Rapport: care explained  Goal: Rounds/Family Conference  Outcome: Ongoing (see interventions/notes)     Problem: Adult Inpatient Plan of Care  Goal: Absence of Hospital-Acquired Illness or Injury  Outcome: Ongoing (see interventions/notes)  Intervention: Identify and Manage Fall Risk  Recent Flowsheet Documentation  Taken 02/19/2024 2139 by Sheffield ORN, RN  Safety Promotion/Fall Prevention:   activity supervised   safety round/check completed   nonskid shoes/slippers when out of bed   fall prevention program maintained  Intervention: Prevent Skin Injury  Recent Flowsheet Documentation  Taken 02/19/2024 2139 by [place] ORN, RN  Body Position: supine, head elevated  Intervention: Prevent and Manage VTE (Venous  Thromboembolism) Risk  Recent Flowsheet Documentation  Taken 02/19/2024 2139 by [place] ORN, RN  VTE Prevention/Management:   ambulation promoted   dorsiflexion/plantar flexion performed  Goal: Optimal Comfort and Wellbeing  Outcome: Ongoing (see interventions/notes)  Intervention: Provide Person-Centered Care  Recent Flowsheet Documentation  Taken 02/19/2024 2139 by Sheffield ORN, RN  Trust Relationship/Rapport: care explained  Goal: Rounds/Family Conference  Outcome: Ongoing (see interventions/notes)     Problem: Gas Exchange Impaired  Goal: Optimal Gas Exchange  Outcome: Ongoing (see interventions/notes)  Intervention: Optimize Oxygenation and Ventilation  Recent Flowsheet Documentation  Taken 02/19/2024 2139 by [place] ORN, RN  Head of Bed [facility]) Positioning: HOB at 45 degrees     Problem: Pneumonia  Goal: Fluid Balance  Outcome: Ongoing (see interventions/notes)  Goal: Absence of Infection Signs and Symptoms  Outcome: Ongoing (see interventions/notes)  Intervention: Prevent Infection Progression  Recent Flowsheet Documentation  Taken 02/19/2024 2139 by Sheffield ORN, RN  Fever Reduction/Comfort Measures:   lightweight bedding   lightweight clothing  Goal: Effective Oxygenation and Ventilation  Outcome: Ongoing (see interventions/notes)  Intervention: Optimize Oxygenation and Ventilation  Recent Flowsheet Documentation  Taken 02/19/2024 2139 by Sheffield ORN, RN  Head of Bed [facility]) Positioning: HOB at 45 degrees     Problem: Fall Injury Risk  Goal: Absence of Fall and Fall-Related Injury  Outcome: Ongoing (see interventions/notes)  Intervention: Identify and Manage Contributors  Recent Flowsheet Documentation  Taken 02/19/2024 2139 by Sheffield ORN, RN  Medication Review/Management: medications reviewed  Intervention: Promote Injury-Free Environment  Recent Flowsheet Documentation  Taken 02/19/2024 2139 by Sheffield ORN, RN  Safety Promotion/Fall Prevention:   activity supervised   safety round/check completed   nonskid shoes/slippers when out of  bed   fall prevention  program maintained     Problem: Electrolyte Imbalance  Goal: Electrolyte Balance  Outcome: Ongoing (see interventions/notes)     Problem: Skin Injury Risk Increased  Goal: Skin Health and Integrity  Outcome: Ongoing (see interventions/notes)  Intervention: Optimize Skin Protection  Recent Flowsheet Documentation  Taken 02/19/2024 2139 by Sheffield ORN, RN  Pressure Reduction Techniques:   Heels elevated off of the bed   Patient turned q 2 hours   Mobility is maximized   Moisture, shear and nutrition are maximized   Frequent weight shifting encouraged  Pressure Reduction Devices:   Heel offloading device utilized   Repositioning wedges/pillows utilized  Activity Management: ROM, active encouraged  Head of Bed (HOB) Positioning: HOB at 45 degrees

## 2024-02-20 NOTE — Respiratory Therapy (Signed)
 Pt sleeping therapy not done

## 2024-02-21 ENCOUNTER — Inpatient Hospital Stay (HOSPITAL_COMMUNITY)

## 2024-02-21 DIAGNOSIS — I2510 Atherosclerotic heart disease of native coronary artery without angina pectoris: Secondary | ICD-10-CM

## 2024-02-21 DIAGNOSIS — N1330 Unspecified hydronephrosis: Secondary | ICD-10-CM

## 2024-02-21 LAB — BASIC METABOLIC PANEL
ANION GAP: 11 mmol/L (ref 4–13)
BUN/CREA RATIO: 30 — ABNORMAL HIGH (ref 6–22)
BUN: 25 mg/dL (ref 7–25)
CALCIUM: 9.7 mg/dL (ref 8.6–10.3)
CHLORIDE: 99 mmol/L (ref 98–107)
CO2 TOTAL: 22 mmol/L (ref 21–31)
CREATININE: 0.83 mg/dL (ref 0.60–1.30)
ESTIMATED GFR: 75 mL/min/1.73mˆ2 (ref >59)
GLUCOSE: 141 mg/dL — ABNORMAL HIGH (ref 74–109)
OSMOLALITY, CALCULATED: 271 mosm/kg (ref 270–290)
POTASSIUM: 3.4 mmol/L — ABNORMAL LOW (ref 3.5–5.1)
SODIUM: 132 mmol/L — ABNORMAL LOW (ref 136–145)

## 2024-02-21 LAB — BLOOD GAS W/ CO-OX, LYTES, LACTATE REFLEX
%FIO2 (ARTERIAL): 60 %
BASE EXCESS (ARTERIAL): 2.8 mmol/L (ref 0.0–3.0)
BICARBONATE (ARTERIAL): 27.1 mmol/L (ref 21.0–28.0)
CARBOXYHEMOGLOBIN: 1.5 % (ref <=3.0)
CHLORIDE: 98 mmol/L (ref 98–107)
GLUCOSE: 112 mg/dL (ref 65–125)
HEMATOCRITRT: 41 % (ref 37–50)
HEMOGLOBIN: 13.8 g/dL (ref 12.0–18.0)
IONIZED CALCIUM: 1.23 mmol/L (ref 1.15–1.33)
LACTATE: 1.4 mmol/L (ref <=1.9)
MET-HEMOGLOBIN: 0.7 % (ref <=1.5)
O2 SATURATION (ARTERIAL): 96.4 % (ref 94.0–98.0)
O2CT: 18.5 %
OXYHEMOGLOBIN: 95.3 % — ABNORMAL HIGH (ref 90.0–95.0)
PAO2/FIO2 RATIO: 137
PCO2 (ARTERIAL): 40 mmHg (ref 35–45)
PH (ARTERIAL): 7.44 (ref 7.35–7.45)
PO2 (ARTERIAL): 82 mmHg — ABNORMAL LOW (ref 83–108)
SODIUM: 131 mmol/L — ABNORMAL LOW (ref 136–145)
WHOLE BLOOD POTASSIUM: 3.8 mmol/L (ref 3.5–5.1)

## 2024-02-21 LAB — CBC
HCT: 39.9 % (ref 31.2–41.9)
HGB: 13.5 g/dL (ref 10.9–14.3)
MCH: 29.2 pg (ref 24.7–32.8)
MCHC: 33.7 g/dL (ref 32.3–35.6)
MCV: 86.5 fL (ref 75.5–95.3)
MPV: 6.6 fL — ABNORMAL LOW (ref 7.9–10.8)
PLATELETS: 562 x10ˆ3/uL — ABNORMAL HIGH (ref 140–440)
RBC: 4.62 x10ˆ6/uL (ref 3.63–4.92)
RDW: 16.3 % (ref 12.3–17.7)
WBC: 20.2 x10ˆ3/uL — ABNORMAL HIGH (ref 3.8–11.8)

## 2024-02-21 LAB — SEDIMENTATION RATE: ERYTHROCYTE SEDIMENTATION RATE (ESR): 90 mm/h — ABNORMAL HIGH (ref <30)

## 2024-02-21 LAB — MAGNESIUM: MAGNESIUM: 1.8 mg/dL — ABNORMAL LOW (ref 1.9–2.7)

## 2024-02-21 MED ORDER — FUROSEMIDE 10 MG/ML INJECTION SOLUTION
40.0000 mg | Freq: Two times a day (BID) | INTRAMUSCULAR | Status: DC
Start: 2024-02-21 — End: 2024-02-24
  Administered 2024-02-21 – 2024-02-22 (×2): 40 mg via INTRAVENOUS
  Filled 2024-02-21 (×2): qty 4

## 2024-02-21 MED ORDER — POTASSIUM CHLORIDE ER 20 MEQ TABLET,EXTENDED RELEASE(PART/CRYST)
20.0000 meq | ORAL_TABLET | Freq: Once | ORAL | Status: AC
Start: 2024-02-21 — End: 2024-02-21
  Administered 2024-02-21: 20 meq via ORAL
  Filled 2024-02-21: qty 1

## 2024-02-21 MED ORDER — FUROSEMIDE 10 MG/ML INJECTION SOLUTION
40.0000 mg | Freq: Once | INTRAMUSCULAR | Status: AC
Start: 2024-02-21 — End: 2024-02-21
  Administered 2024-02-21: 40 mg via INTRAVENOUS
  Filled 2024-02-21: qty 4

## 2024-02-21 MED ORDER — METHYLPREDNISOLONE SOD SUCC 125 MG SOLUTION FOR INJECTION WRAPPER
62.5000 mg | Freq: Four times a day (QID) | INTRAVENOUS | Status: DC
Start: 2024-02-21 — End: 2024-02-23
  Administered 2024-02-21 – 2024-02-23 (×8): 62.5 mg via INTRAVENOUS
  Filled 2024-02-21 (×9): qty 2

## 2024-02-21 MED ORDER — ALPRAZOLAM 0.5 MG TABLET
0.5000 mg | ORAL_TABLET | ORAL | Status: AC
  Administered 2024-02-21: 0.5 mg via ORAL
  Filled 2024-02-21: qty 1

## 2024-02-21 NOTE — Respiratory Therapy (Signed)
 02/21/24 0057   High Flow Nasal Cannula   Start Time 0058   Order Updated in EMR Yes   Equipment Adult   HFNC Device Airvo   HFNC Status Placed On HFNC   Facial Skin Integrity WNL   H2O Bottle Check (HFNC) Initial   Circuit Initial   Temperature 31 C (87.8 F)   Flow  40 LPM   FiO2 45 %   SpO2 92   $HFNC Initial Charge (Resp only) HFNC-I   $Type of Circuit Changed (Resp only) HFNC      Patient placed on HFNC after RN had placed patient on 10L/min Oxymask.

## 2024-02-21 NOTE — Respiratory Therapy (Signed)
 02/21/24 0432   High Flow Nasal Cannula   Start Time 0432   Order Updated in EMR Yes   Equipment Adult   HFNC Device Airvo   HFNC Status Patient Refused

## 2024-02-21 NOTE — Respiratory Therapy (Signed)
 02/21/24 1922   Respiratory   Respiratory WDL ex   Cough Frequency infrequent   Breath Sounds   L General Breath Sounds Diminished   R General Breath Sounds Diminished   RT Oxygen Therapy   Start Time 1922   Orders Updated in EMR Yes   Oxygen Check/Change Changed To   $ O2 Delivery HFNC   Flow (L/min) (RT) 60   FiO2 (RT) 60 %  (Titrated FiO2 100 %to 60%.)   SpO2 100 %   High Flow Nasal Cannula   Start Time 1900   Order Updated in EMR Yes   Equipment Adult   HFNC Device Hamilton C1   HFNC Status Currently On   H2O Bottle Check (HFNC) Checked   Circuit Checked   Temperature 31 C (87.8 F)   Flow  60 LPM   $HFNC Subsequent Charge (Resp only) HFNC-Sub   Stop Time 1942   Duration 42 Minutes   Aerosol Therapy (SVN)   Start Time 1922   Treatment Status Given   Respiratory Treatment Status (SVN) given   Route (Aerosol Therapy) aerogen   $ Respiratory Treatment (Resp only) Neb (Sub)   Medications DuoNeb (albuterol -atrovent);Mucomyst  (Acetylcysteine );Pulmicort  (Budesonide )   Patient Position HOB elevated   Posttreatment Assessment (SVN) breath sounds unchanged   Signs of Intolerance (SVN) none        MetaNeb/[place]   Start Time 1922   Treatment Status Given   $MetaNeb Charge Capture (Resp Only) Neb (Sub)   Route mouth piece  (mouth piece)   Medication DuoNeb   CHFO/CPEP CHFO/CPEP   Patient Tolerance good   End Time 1942   Respiratory Pre/Post-Treatment Assess   Pre-Treatment Heart Rate (beats/min) 110   Pre-Treatment Resp Rate (breaths/min) 18   Device (Oxygen Therapy) high-flow nasal cannula;humidified;heated   Flow (L/min) (Oxygen Therapy) 60   Oxygen Concentration (%) 100   Breath Sounds Post-Respiratory Treatment   Throughout All Fields Post Treatment All Fields   Breath Sounds Posttreatment All Fields no change   Chest Physiotherapy (CPT)   Start Time 1922   $ CPT/PD Identifier (Resp only) S   Intermittent Positive Pressure Breathing (IPPB)   Start Time 1922   $IPPB Tx Charge (Resp only) IPPB (Sub)   Signs of  Intolerance (IPPB) none   Stop Time 1942   Duration (minutes) 20 minutes     Patient tolerated [place] and titrating FiO2 to 60% after treatment.  Patient is rest on HFNC.

## 2024-02-21 NOTE — Respiratory Therapy (Signed)
 02/21/24 0228   RT Oxygen Therapy   Start Time 0228   Oxygen Check/Change Changed To   $ O2 Delivery OM   Flow (L/min) (RT) 10   SpO2 96 %     Patient did not tolerate heat lower heat setting on HFNC, placed on 10 L/min oxymask.

## 2024-02-21 NOTE — Respiratory Therapy (Signed)
 02/21/24 0953   High Flow Nasal Cannula   Start Time 0953   Order Updated in EMR Yes   Equipment Adult   HFNC Device Hamilton C1   HFNC Status Currently On   Facial Skin Integrity WNL   H2O Bottle Check (HFNC) Checked   Circuit Checked   Temperature 31 C (87.8 F)   Flow  60 LPM   FiO2 50 %   SpO2 88   $HFNC Subsequent Charge (Resp only) HFNC-Sub   $Type of Circuit Changed (Resp only) HFNC     Dr. Lendia in room. Notified me the FiO2 was decreased to 50%. SpO2 88%. Care ongoing.

## 2024-02-21 NOTE — Respiratory Therapy (Signed)
 02/21/24 1258   High Flow Nasal Cannula   Start Time 1258   Order Updated in EMR Yes   Equipment Adult   HFNC Device Hamilton C1   HFNC Status Currently On   Facial Skin Integrity WNL   H2O Bottle Check (HFNC) Checked   Circuit Checked   Temperature 31 C (87.8 F)   Flow  60 LPM   FiO2 50 %   SpO2 92   $HFNC Subsequent Charge (Resp only) HFNC-Sub   $Type of Circuit Changed (Resp only) HFNC   Stop Time 1323   Duration 25 Minutes     Upon entering room pt's SpO2 97% While at rest. UHFNC adjusted. Pt placed on 50 FiO2. Care ongoing.

## 2024-02-21 NOTE — Respiratory Therapy (Signed)
 02/21/24 1910   Non-Invasive Ventilation Assessment   Start Time 1900   Orders Updated in EMR Yes   $ NIV NIV - subsequent   NIV Status Placed On Bipap   Oxygen Concentration (%) 100   Circuit Checked   Non-Invasive Ventilation Check   Type of Mask FFM   Facial Skin Integrity WNL   Device Hamilton C1    Mode S/T   Set Rate 12   Spontaneous Rate 27 Breaths Per Minute   Minute Volume 16 LPM   FiO2 Set 100%   IPAP 20 cmH20   EPAP 10 cmH2O   I-Time 1 seconds   Alarms   Audible Alarms Checked & Functioning   Hi Rate 40   Lo Rate 5   Hi Pressure 40 cmH2O   Lo Pressure 5 cmH2O   Apnea Alarm 20 Seconds     Patient was placed on BiPAP after RN notified me that patient was desaturating after transport from CT scan.  Patient stabilized  SpO2 was 100%.

## 2024-02-21 NOTE — Respiratory Therapy (Addendum)
 02/21/24 1110   High Flow Nasal Cannula   Start Time 1110   Order Updated in EMR Yes   Equipment Adult   HFNC Device Hamilton C1   HFNC Status Currently On   Facial Skin Integrity WNL   H2O Bottle Check (HFNC) Checked   Circuit Checked   Temperature 31 C (87.8 F)   Flow  60 LPM   FiO2 70 %   SpO2 91   $HFNC Subsequent Charge (Resp only) HFNC-Sub   $Type of Circuit Changed (Resp only) HFNC   Stop Time 1120   Duration 10 Minutes     Called to room by nursing due to pt's SpO2 desatting to the mid to high 70's while moving in chair. Upon entering room pt's SpO2 84%. UHFNC adjusted. FiO2 increased to 70%. SpO2 91%. Care ongoing.

## 2024-02-21 NOTE — Nurses Notes (Signed)
 Patient up to chair per request. O2 sat dropping and remains 80-85%. Attempting to titrate O2 without success. Notified RT to assess.

## 2024-02-21 NOTE — Respiratory Therapy (Signed)
 02/21/24 0842   High Flow Nasal Cannula   Start Time 0842   Order Updated in EMR Yes   Equipment Adult   HFNC Device Hamilton C1   HFNC Status Placed On HFNC   Facial Skin Integrity WNL   H2O Bottle Check (HFNC) Checked   Circuit Checked   Temperature 31 C (87.8 F)   Flow  40 LPM   FiO2 50 %   SpO2 92   $HFNC Subsequent Charge (Resp only) HFNC-Sub   $Type of Circuit Changed (Resp only) HFNC   Stop Time 0912   Duration 30 Minutes     Upon entering room pt's SpO2 96%. UHFNC adjusted. Flow decreased to 40L and FiO2 decreased to 50%. SpO2 92%. Care ongoing.

## 2024-02-21 NOTE — Respiratory Therapy (Signed)
 02/21/24 0549   Non-Invasive Ventilation Assessment   Start Time 0540   Orders Updated in EMR Yes   $ NIV NIV - initial   NIV Status Placed On Bipap   $ O2 Delivery BP   Oxygen Concentration (%) 45   Non-Invasive Ventilation Check   Type of Mask FFM   NG/OG Placed No   Facial Skin Integrity WNL   Device Hamilton C1    Mode S/T   Spontaneous Volume 425 mL   Set Rate 12   Spontaneous Rate 36 Breaths Per Minute   Minute Volume 16.1 LPM   PEEP/CPAP (cm/H2O) 5 CM H20   IPAP 10 cmH20   EPAP 5 cmH2O   SpO2 91 %   Alarms   Audible Alarms Checked & Functioning   Hi Rate 40   Lo Rate 5   Hi Pressure 40 cmH2O   Lo Pressure 5 cmH2O   Apnea Alarm 20 Seconds     Patient placed on BiPAP because patient was unable to maintain SpO2 above 88% on 13 L/ min oxymask.

## 2024-02-21 NOTE — Nurses Notes (Signed)
 Patient refusing BiPAP and high flow. O2 sat 92% on 14L OM. Notified Hospitalist.

## 2024-02-21 NOTE — Nurses Notes (Signed)
 Hospitalist at bedside for assessment. Patient agreeable for high flow. RT at bedside.

## 2024-02-21 NOTE — Respiratory Therapy (Signed)
 02/21/24 0608   Non-Invasive Ventilation Assessment   Orders Updated in EMR Yes   $ NIV NIV - subsequent   NIV Status Patient Refused   (Pa tient wanted Bipap taken off.)     Patient was unable to tolerated BiPAP, RN offered medication to treat anxiety.

## 2024-02-21 NOTE — Nurses Notes (Signed)
 [facility] Medicine [facility]  [address]  [place], [address]  [phone]  (Fax) [phone]  Rehabilitation Department  Physical Therapy    Restorative Aide Note    Date: 02/21/2024  Patient's Name: Brooke Maxwell  Date of Birth: [birth year]  Height: Height: 154.9 cm (5' 1)  Weight: Weight: 94.2 kg (207 lb 9.6 oz)      Total minutes: 10    Patient effort: good    Patient profile reviewed: yes    Medical Lines: TELEMETRY    Respiratory Status:AIR VO    Existing precautions/restrictions:FALL PRECAUTIONS    Pre-treatment status: PATIENT IN BEDSIDE CHAIR    Communication pre-treatment: CHARGE NURSE    Attention/Behavior: WNL/WFL    Pre-treatment HR: 104    Post-treatment HR: 106    PreSpO2: 94    O2 delivery pre-treatment: AIR VO    Post SpO2: 90    Pre-treatment pain rating: 0/10    Post-treatment pain rating: 0/10    Location of pain: N/A    Bed mobility (roll, bridge, scoot, supine to sit): PATIENT IS UP IN VASCULAR CHAIR     Sitting assistance:  SBA/CGA    Sit to stand: SBA/CGA    Gait: SBA/CGA STOOD PATIENT WITH GAIT BELT, ONE ASSIST AND WHEELED WALKER, PATIENT TOOK 2 STEPS FORWARD 2 STEPS BACK/SIT IN CHAIR PATIENT DESATED TO 70%/NURSE IS AWARE/OXYGEN SATURAION CAME UP TO 90%  /TOOK A LITTLE TIME FOR SATURATION TO COME UP       Post treatment status: IN VASCULAR CHAIR, NEEDS IN REACH, BED ALARMED          Eleanor FORBES Blazer, PAT CARE [facility]  02/21/2024, 11:39

## 2024-02-21 NOTE — Respiratory Therapy (Signed)
 02/21/24 2207   Non-Invasive Ventilation Assessment   Start Time 2208   $ NIV NIV - subsequent   NIV Status Patient Refused   (Patient refused Bipap, wanted to stay on HFNC.)   $ O2 Delivery HFNC   Oxygen Concentration (%) 75

## 2024-02-21 NOTE — Respiratory Therapy (Signed)
 02/21/24 0930   High Flow Nasal Cannula   Start Time 0930   Order Updated in EMR Yes   Equipment Adult   HFNC Device Hamilton C1   HFNC Status Currently On   Facial Skin Integrity WNL   H2O Bottle Check (HFNC) Checked   Circuit Checked   Temperature 31 C (87.8 F)   Flow  60 LPM   FiO2 80 %   SpO2 91   $HFNC Subsequent Charge (Resp only) HFNC-Sub   $Type of Circuit Changed (Resp only) HFNC   Stop Time 0940   Duration 10 Minutes     Checked on pt. SpO2 72%. UHFNC adjusted. Flow increased to 60L and FiO2 increased to 80%. Nursing and Physician notified. SpO2 91%. Care ongoing.

## 2024-02-21 NOTE — Respiratory Therapy (Signed)
 Latest Reference Range & Units 02/21/24 07:55   %FIO2 % 60   PH 7.35 - 7.45  7.44   PCO2 35 - 45 mm/Hg 40   PO2 83 - 108 mm/Hg 82 (L)   BICARBONATE 21.0 - 28.0 mmol/L 27.1   BASE EXCESS 0.0 - 3.0 mmol/L 2.8   PAO2/FIO2 RATIO  137   O2CT % 18.5   O2 SATURATION (ARTERIAL) 94.0 - 98.0 % 96.4   HEMATOCRITRT 37 - 50 % 41   CARBOXYHEMOGLOBIN <=3.0 % 1.5   HEMOGLOBIN 12.0 - 18.0 g/dL 86.1   MET-HEMOGLOBIN <=8.4 % 0.7   OXYHEMOGLOBIN 90.0 - 95.0 % 95.3 (H)   SODIUM 136 - 145 mmol/L 131 (L)   LACTATE <=1.9 mmol/L 1.4   CHLORIDE 98 - 107 mmol/L 98   GLUCOSE 65 - 125 mg/dL 887   IONIZED CALCIUM 8.84 - 1.33 mmol/L 1.23   HEMOLYSIS None, Mild  None   WHOLE BLOOD K+ 3.5 - 5.1 mmol/L 3.8   (L): Data is abnormally low  (H): Data is abnormally high    Dr Lendia and Josette, LPN notified of ABG results

## 2024-02-21 NOTE — Progress Notes (Addendum)
 [place] MEDICINE [facility]    HOSPITALIST PROGRESS NOTE    Brooke Maxwell  Date of service: 02/21/2024  Date of Admission:  02/14/2024  Hospital Day:  LOS: 7 days     Interval History:   Patient seen and evaluated at bedside   Continues to be on high-flow at 50 L at 60%      Intake & Output:    Intake/Output Summary (Last 24 hours) at 02/21/2024 1350  Last data filed at 02/21/2024 0500  Gross per 24 hour   Intake 720 ml   Output 600 ml   Net 120 ml       Vital Signs:  Filed Vitals:    02/21/24 0842 02/21/24 1045 02/21/24 1149 02/21/24 1258   BP:   135/89    Pulse:  (!) 114 (!) 113    Resp:   17    Temp:   36.5 C (97.7 F)    SpO2: 92% 97% 96% 91%        acetylcysteine  (MUCOMYST ) 20% nebulized solution, 4 mL, Nebulization, 3x/day  albuterol  (PROVENTIL ) 2.5 mg / 3 mL (0.083%) neb solution, 2.5 mg, Nebulization, Q4H PRN  ALPRAZolam  (XANAX ) tablet, 0.5 mg, Oral, Daily PRN  amitriptyline  (ELAVIL ) tablet, 25 mg, Oral, HS PRN  ARIPiprazole  (ABILIFY ) tablet, 30 mg, Oral, Daily  aspirin  chewable tablet 81 mg, 81 mg, Oral, Daily  atorvastatin  (LIPITOR) tablet, 80 mg, Oral, QPM  budesonide  (PULMICORT  RESPULES) 0.5 mg/2 mL nebulizer suspension, 1 mg, Nebulization, 2x/day  busPIRone  (BUSPAR ) tablet, 10 mg, Oral, 2x/day  cyclobenzaprine  (FLEXERIL ) tablet, 10 mg, Oral, 3x/day PRN  enoxaparin  PF (LOVENOX ) 40 mg/0.4 mL SubQ injection, 40 mg, Subcutaneous, Daily  FLUoxetine  (PROzac ) capsule, 20 mg, Oral, Daily  furosemide  (LASIX ) 10 mg/mL injection, 40 mg, Intravenous, Q12H  ipratropium-albuterol  0.5 mg-3 mg(2.5 mg base)/3 mL Solution for Nebulization, 3 mL, Nebulization, 4x/day  levoFLOXacin  (LEVAQUIN ) tablet, 750 mg, Oral, Q24H  methylPREDNISolone  sod succ (SOLU-medrol ) 125 mg/2 mL injection, 62.5 mg, Intravenous, Q6H  pantoprazole  (PROTONIX ) delayed release tablet, 40 mg, Oral, Daily  perflutren  lipid microspheres (DEFINITY ) 1.3 mL in NS 10 mL (tot vol) injection, 2 mL, Intravenous, Cardiology Once  PRN  rOPINIRole  (REQUIP ) tablet, 0.25 mg, Oral, NIGHTLY  zinc  oxide 56.7 g, clotrimazole (LOTRIMIN) 15 g, hydrocortisone (CORTIZONE-10) 28 g compounded topical cream, , Apply Topically, Q30 Min PRN           Assessment/ Plan:     # possible pneumonitis   Did have exposure to strong fumes of bleach while she was cleaning  We will start her on IV steroids and see if she responds to that   Increased the steroids     #acute hypoxemic respiratory failure   could be from the above   Currently requiring high-flow nasal cannula      #?  Community-acquired pneumonia   Infectious workup has been negative so far   We will complete the course of levofloxacin      #Demand ischemia   Likely secondary from the respiratory distress  Troponins elevated and downtrending   No ST segment or T-wave changes in the EKG  Cardiology was consulted     #Elevated D-dimer   CT chest negative for any PE   Could be secondary from inflammation      #history of anxiety and depression   Continue home medication        Disposition Planning:  Pending clinical course        IVF:  None  Diet:  Regular  Gastric Ulcer Prophylaxis:  Pantoprazole    Bowel Regimen:  None    DVT/PE Prophylaxis :  Lovenox    Daily Orders (From admission, onward)       Start     Ordered    02/14/24 2245  PT IS INTERMEDIATE RISK FOR VENOUS THROMBOEMBOLISM  (MEDIUM RISK VTE LEVEL)  CONTINUOUS        References:    VTE RISK ASSESSMENT TOOL    02/14/24 2243                   Lines/Drains:         On the day of the encounter, a total of 25 minutes was spent on this patient encounter including review of historical information, examination, documentation and post-visit activities. The time documented excludes procedural time and time spent on wellness portion of the visit.     Brooke Maxwell [name], MD  02/21/2024  [facility] MED[facility]INE HOSPITALIST

## 2024-02-21 NOTE — Respiratory Therapy (Signed)
 02/21/24 0638   High Flow Nasal Cannula   Start Time [phone]   Order Updated in EMR Yes   Equipment Adult   HFNC Device Hamilton C1   HFNC Status Placed On HFNC   Flow  50 LPM   FiO2 60 %   SpO2 93     Patient placed on Heated HFNC.

## 2024-02-21 NOTE — Care Management Notes (Addendum)
 Patient placed back on HiFlo oxygen last evening.     CM spoke with Burnard Domino, [facility]/LTACH 02/20/2024 regarding assessment for possible placement.  Yesterday evening, patient was able to wean off HiFLo to 4 lpm via nc last evening however, was then placed back on HiFlo.  Notified Burnard Domino this am of placement back on HiFlo.    Per physician, awaiting pulm consult.      2:19 PM  Patient approved for LTACH placement at [facility].    Physician aware of approval.

## 2024-02-21 NOTE — Nurses Notes (Signed)
 Pt off floor to CT at this time.

## 2024-02-22 DIAGNOSIS — D72829 Elevated white blood cell count, unspecified: Secondary | ICD-10-CM

## 2024-02-22 DIAGNOSIS — I2489 Other forms of acute ischemic heart disease: Secondary | ICD-10-CM

## 2024-02-22 LAB — COMPREHENSIVE METABOLIC PANEL, NON-FASTING
ALBUMIN/GLOBULIN RATIO: 1 (ref 0.8–1.4)
ALBUMIN: 4 g/dL (ref 3.5–5.7)
ALKALINE PHOSPHATASE: 89 U/L (ref 34–104)
ALT (SGPT): 9 U/L (ref 7–52)
ANION GAP: 14 mmol/L — ABNORMAL HIGH (ref 4–13)
AST (SGOT): 17 U/L (ref 13–39)
BILIRUBIN TOTAL: 0.5 mg/dL (ref 0.3–1.0)
BUN/CREA RATIO: 29 — ABNORMAL HIGH (ref 6–22)
BUN: 41 mg/dL — ABNORMAL HIGH (ref 7–25)
CALCIUM, CORRECTED: 9.6 mg/dL (ref 8.9–10.8)
CALCIUM: 9.6 mg/dL (ref 8.6–10.3)
CHLORIDE: 95 mmol/L — ABNORMAL LOW (ref 98–107)
CO2 TOTAL: 24 mmol/L (ref 21–31)
CREATININE: 1.39 mg/dL — ABNORMAL HIGH (ref 0.60–1.30)
ESTIMATED GFR: 41 mL/min/1.73mˆ2 — ABNORMAL LOW (ref >59)
GLOBULIN: 4.1 — ABNORMAL HIGH (ref 2.0–3.5)
GLUCOSE: 166 mg/dL — ABNORMAL HIGH (ref 74–109)
OSMOLALITY, CALCULATED: 280 mosm/kg (ref 270–290)
POTASSIUM: 3.9 mmol/L (ref 3.5–5.1)
PROTEIN TOTAL: 8.1 g/dL (ref 6.4–8.9)
SODIUM: 133 mmol/L — ABNORMAL LOW (ref 136–145)

## 2024-02-22 LAB — CBC
HCT: 40.2 % (ref 31.2–41.9)
HGB: 13.2 g/dL (ref 10.9–14.3)
MCH: 28.6 pg (ref 24.7–32.8)
MCHC: 32.8 g/dL (ref 32.3–35.6)
MCV: 87.1 fL (ref 75.5–95.3)
MPV: 6.7 fL — ABNORMAL LOW (ref 7.9–10.8)
PLATELETS: 618 x10ˆ3/uL — ABNORMAL HIGH (ref 140–440)
RBC: 4.61 x10ˆ6/uL (ref 3.63–4.92)
RDW: 16.2 % (ref 12.3–17.7)
WBC: 21 x10ˆ3/uL — ABNORMAL HIGH (ref 3.8–11.8)

## 2024-02-22 LAB — PHOSPHORUS: PHOSPHORUS: 5.2 mg/dL (ref 3.7–7.2)

## 2024-02-22 LAB — COVID-19, FLU A/B, RSV RAPID BY PCR
INFLUENZA VIRUS TYPE A: NOT DETECTED
INFLUENZA VIRUS TYPE B: NOT DETECTED
RESPIRATORY SYNCTIAL VIRUS (RSV): NOT DETECTED
SARS-CoV-2: NOT DETECTED

## 2024-02-22 LAB — MAGNESIUM: MAGNESIUM: 1.8 mg/dL — ABNORMAL LOW (ref 1.9–2.7)

## 2024-02-22 MED ORDER — ONDANSETRON HCL (PF) 4 MG/2 ML INJECTION SOLUTION
4.0000 mg | Freq: Three times a day (TID) | INTRAMUSCULAR | Status: DC | PRN
Start: 2024-02-22 — End: 2024-02-24
  Administered 2024-02-22 – 2024-02-23 (×2): 4 mg via INTRAVENOUS
  Filled 2024-02-22 (×2): qty 2

## 2024-02-22 NOTE — Respiratory Therapy (Signed)
 02/22/24 0800   High Flow Nasal Cannula   Start Time 0800   Order Updated in EMR Yes   Equipment Adult   HFNC Device Hamilton C1   HFNC Status Currently On   Facial Skin Integrity WNL   H2O Bottle Check (HFNC) Checked   Circuit Checked   Temperature 31 C (87.8 F)   Flow  60 LPM   FiO2 65 %   SpO2 89   $HFNC Subsequent Charge (Resp only) HFNC-Sub     Increased fio2 to 65% with( O2 of 89 on 60%)  97% on 65% and had patient pulled up

## 2024-02-22 NOTE — Care Plan (Signed)
 Problem: Adult Inpatient Plan of Care  Goal: Absence of Hospital-Acquired Illness or Injury  Outcome: Ongoing (see interventions/notes)  Intervention: Prevent Skin Injury  Recent Flowsheet Documentation  Taken 02/22/2024 1200 by Annabella HERO, RN  Body Position:   supine, head elevated   side lying, right  Taken 02/22/2024 1100 by Annabella HERO, RN  Skin Protection: adhesive use limited  Taken 02/22/2024 1000 by Annabella HERO, RN  Body Position: supine, head elevated  Goal: Optimal Comfort and Wellbeing  Outcome: Ongoing (see interventions/notes)  Goal: Rounds/Family Conference  Outcome: Ongoing (see interventions/notes)     Problem: Adult Inpatient Plan of Care  Goal: Absence of Hospital-Acquired Illness or Injury  Outcome: Ongoing (see interventions/notes)  Intervention: Prevent Skin Injury  Recent Flowsheet Documentation  Taken 02/22/2024 1200 by Annabella HERO, RN  Body Position:   supine, head elevated   side lying, right  Taken 02/22/2024 1100 by Annabella HERO, RN  Skin Protection: adhesive use limited  Taken 02/22/2024 1000 by Annabella HERO, RN  Body Position: supine, head elevated  Goal: Optimal Comfort and Wellbeing  Outcome: Ongoing (see interventions/notes)  Goal: Rounds/Family Conference  Outcome: Ongoing (see interventions/notes)     Problem: Gas Exchange Impaired  Goal: Optimal Gas Exchange  Outcome: Ongoing (see interventions/notes)     Problem: Fall Injury Risk  Goal: Absence of Fall and Fall-Related Injury  Outcome: Ongoing (see interventions/notes)     Problem: Skin Injury Risk Increased  Goal: Skin Health and Integrity  Outcome: Ongoing (see interventions/notes)  Intervention: Optimize Skin Protection  Recent Flowsheet Documentation  Taken 02/22/2024 1100 by Annabella HERO, RN  Pressure Reduction Techniques:   Patient turned q 2 hours   Frequent weight shifting encouraged   Moisture, shear and nutrition are maximized   Supplemented with small shifts   Mobility is maximized  Pressure Reduction Devices: Repositioning  wedges/pillows utilized  Skin Protection: adhesive use limited     Problem: Discharge Needs Assessment  Goal: Discharge Needs Assessment  Outcome: Ongoing (see interventions/notes)

## 2024-02-22 NOTE — Respiratory Therapy (Signed)
 High flow flow down to 40L and fio2 to 45%,  spo2 98% on previous settings, will monitor and possible take off high flow if continues to improve

## 2024-02-22 NOTE — Respiratory Therapy (Signed)
 02/22/24 0221   Non-Invasive Ventilation Assessment   Start Time 0221   Orders Updated in EMR Yes   $ NIV NIV - subsequent   NIV Status Patient Refused    $ O2 Delivery HFNC   Oxygen Concentration (%) 60   Non-Invasive Ventilation Check   SpO2 93 %     Patient is resting on HFNC.  SpO2 has been titrated from 100% earlier.

## 2024-02-22 NOTE — Respiratory Therapy (Addendum)
 Dr. Lendia in to see patient decreased high flow to 50L and 55%.  Stated he wants her weaned off ASAP  However,  i will titrate to patients need

## 2024-02-22 NOTE — Respiratory Therapy (Signed)
 02/22/24 0000   RT Oxygen Therapy   SpO2 94 %   High Flow Nasal Cannula   Start Time 0001   Order Updated in EMR Yes   Equipment Adult   HFNC Device Hamilton C1   HFNC Status Currently On   H2O Bottle Check (HFNC) Checked   Circuit Checked   Flow  60 LPM   FiO2 60 %   $HFNC Subsequent Charge (Resp only) HFNC-Sub   $Type of Circuit Changed (Resp only) HFNC   Stop Time 0002   Duration 1 Minutes     Titrated FIO2 to 60%.

## 2024-02-22 NOTE — Respiratory Therapy (Signed)
 02/22/24 0959   High Flow Nasal Cannula   Start Time 0959   Order Updated in EMR Yes   Equipment Adult   HFNC Device Hamilton C1   HFNC Status Currently On   Facial Skin Integrity WNL   H2O Bottle Check (HFNC) Changed   Circuit Checked   Temperature 31 C (87.8 F)   Flow  45 LPM   FiO2 55 %   SpO2 91

## 2024-02-22 NOTE — Progress Notes (Addendum)
 [place] MEDICINE [facility]    HOSPITALIST PROGRESS NOTE    Brooke Maxwell  Date of service: 02/22/2024  Date of Admission:  02/14/2024  Hospital Day:  LOS: 8 days     Interval History:   Patient was seen and evaluated at bedside   Continues to be on high-flow   ANCA studies pending      Intake & Output:    Intake/Output Summary (Last 24 hours) at 02/22/2024 1217  Last data filed at 02/22/2024 0500  Gross per 24 hour   Intake 580 ml   Output 250 ml   Net 330 ml       Vital Signs:  Filed Vitals:    02/22/24 0221 02/22/24 0400 02/22/24 1034 02/22/24 1159   BP:  130/81  131/80   Pulse:  (!) 109 (!) 108 (!) 103   Resp:  18     Temp:  36.7 C (98.1 F)  36.9 C (98.4 F)   SpO2: 93% 91% 93% 95%        acetylcysteine  (MUCOMYST ) 20% nebulized solution, 4 mL, Nebulization, 3x/day  albuterol  (PROVENTIL ) 2.5 mg / 3 mL (0.083%) neb solution, 2.5 mg, Nebulization, Q4H PRN  ALPRAZolam  (XANAX ) tablet, 0.5 mg, Oral, Daily PRN  amitriptyline  (ELAVIL ) tablet, 25 mg, Oral, HS PRN  ARIPiprazole  (ABILIFY ) tablet, 30 mg, Oral, Daily  aspirin  chewable tablet 81 mg, 81 mg, Oral, Daily  atorvastatin  (LIPITOR) tablet, 80 mg, Oral, QPM  budesonide  (PULMICORT  RESPULES) 0.5 mg/2 mL nebulizer suspension, 1 mg, Nebulization, 2x/day  busPIRone  (BUSPAR ) tablet, 10 mg, Oral, 2x/day  cyclobenzaprine  (FLEXERIL ) tablet, 10 mg, Oral, 3x/day PRN  enoxaparin  PF (LOVENOX ) 40 mg/0.4 mL SubQ injection, 40 mg, Subcutaneous, Daily  FLUoxetine  (PROzac ) capsule, 20 mg, Oral, Daily  [Held by provider] furosemide  (LASIX ) 10 mg/mL injection, 40 mg, Intravenous, Q12H  ipratropium-albuterol  0.5 mg-3 mg(2.5 mg base)/3 mL Solution for Nebulization, 3 mL, Nebulization, 4x/day  levoFLOXacin  (LEVAQUIN ) tablet, 750 mg, Oral, Q24H  methylPREDNISolone  sod succ (SOLU-medrol ) 125 mg/2 mL injection, 62.5 mg, Intravenous, Q6H  pantoprazole  (PROTONIX ) delayed release tablet, 40 mg, Oral, Daily  perflutren  lipid microspheres (DEFINITY ) 1.3 mL in NS 10 mL (tot vol)  injection, 2 mL, Intravenous, Cardiology Once PRN  rOPINIRole  (REQUIP ) tablet, 0.25 mg, Oral, NIGHTLY  zinc  oxide 56.7 g, clotrimazole (LOTRIMIN) 15 g, hydrocortisone (CORTIZONE-10) 28 g compounded topical cream, , Apply Topically, Q30 Min PRN           Assessment/ Plan:     # possible pneumonitis   Did have exposure to strong fumes of bleach while she was cleaning  We will start her on IV steroids and see if she responds to that   Increased the steroids  Did consult [facility] at [facility] who recommended to continue IV steroids and wean her off of the high-flow and they suggested that if no change by Monday they would re-evaluate for possible bronchoscopy     #acute hypoxemic respiratory failure   could be from the above   Currently requiring high-flow nasal cannula     #Leukocytosis likely from steroids     #?  Community-acquired pneumonia   Infectious workup has been negative so far   We will complete the course of levofloxacin      #Demand ischemia   Likely secondary from the respiratory distress  Troponins elevated and downtrending   No ST segment or T-wave changes in the EKG  Cardiology was consulted     #Elevated D-dimer   CT chest negative for  any PE   Could be secondary from inflammation      #history of anxiety and depression   Continue home medication        Disposition Planning:  Pending clinical course        IVF:  None  Diet:  Regular  Gastric Ulcer Prophylaxis:  Pantoprazole    Bowel Regimen:  None    DVT/PE Prophylaxis :  Lovenox    Daily Orders (From admission, onward)       Start     Ordered    02/14/24 2245  PT IS INTERMEDIATE RISK FOR VENOUS THROMBOEMBOLISM  (MEDIUM RISK VTE LEVEL)  CONTINUOUS        References:    VTE RISK ASSESSMENT TOOL    02/14/24 2243                   Lines/Drains:         On the day of the encounter, a total of 25 minutes was spent on this patient encounter including review of historical information, examination, documentation and post-visit activities. The time documented  excludes procedural time and time spent on wellness portion of the visit.     Caleel Kiner [name], MD  02/22/2024  [facility] MEDICINE HOSPITALIST

## 2024-02-22 NOTE — Respiratory Therapy (Signed)
 Sat 90-91% on 45L flow, 55% fio2

## 2024-02-22 NOTE — Respiratory Therapy (Signed)
 02/22/24 0558   Non-Invasive Ventilation Assessment   Start Time 0559   Orders Updated in EMR Yes   $ NIV NIV - subsequent   NIV Status Patient Refused    $ O2 Delivery HFNC   Oxygen Concentration (%) 60   Flow (L/min) (Oxygen Therapy) 60     Patient wore HFNC all night.  Currently on HFNC 60L 60%.

## 2024-02-22 NOTE — Respiratory Therapy (Signed)
 02/22/24 1639   RT Oxygen Therapy   Start Time 1640   Oxygen Check/Change Changed To   $ O2 Delivery NC   Flow (L/min) (RT) 5   High Flow Nasal Cannula   Start Time 1639   HFNC Status Taken Off HFNC     Spo2 93%. Care ongoing

## 2024-02-22 NOTE — Respiratory Therapy (Signed)
 02/22/24 2005   Breath Sounds   L General Breath Sounds Diminished   R General Breath Sounds Diminished   RT Oxygen Therapy   Start Time 2005   $ O2 Delivery NC   Flow (L/min) (RT) 5   SpO2 95 %   Aerosol Therapy (SVN)   Start Time 2005   Treatment Status Given   Route (Aerosol Therapy) mouth piece   $ Respiratory Treatment (Resp only) Neb (SX3)   Medications DuoNeb (albuterol -atrovent);Mucomyst  (Acetylcysteine );Pulmicort  (Budesonide )   Patient Position HOB elevated        MetaNeb/Volara   Start Time 2005   Treatment Status Given   $MetaNeb Charge Capture (Resp Only) Neb (SX3)   Route mouth piece   Medication DuoNeb;Pulmicort  (Budesonide )  (Muycomyst)   CHFO/CPEP CHFO/CPEP   Pressure (cm H2O) 20 cm H2O   Patient Tolerance good   End Time 2023   Respiratory Pre/Post-Treatment Assess   Pre-Treatment Heart Rate (beats/min) 108   Pre-Treatment Resp Rate (breaths/min) 16   Device (Oxygen Therapy) nasal cannula   Flow (L/min) (Oxygen Therapy) 5   Chest Physiotherapy (CPT)   Start Time 2005   $ CPT/PD Identifier (Resp only) S        Vibratory PEP   Start Time 2005   Treatment Status Given   Stop Time 2023   Duration (minutes) 18 minutes   Intermittent Positive Pressure Breathing (IPPB)   Start Time 2005   $IPPB Tx Charge (Resp only) IPPB (Sub)   Respiratory Treatment Status (IPPB) given   Medications Pulmicort  (Budesonide );Other (Comment)  (Duo neb, mutco)   Route (IPPB) mouthpiece utilized   Stop Time 2023   Duration (minutes) 18 minutes     Patient tolerated Volara, currently on 5L/ min NC.

## 2024-02-23 ENCOUNTER — Inpatient Hospital Stay (HOSPITAL_COMMUNITY)

## 2024-02-23 DIAGNOSIS — N179 Acute kidney failure, unspecified: Secondary | ICD-10-CM

## 2024-02-23 LAB — CBC WITH DIFF
BASOPHIL #: 0 x10ˆ3/uL (ref 0.00–0.10)
BASOPHIL %: 0 % (ref 0–1)
EOSINOPHIL #: 0 x10ˆ3/uL (ref 0.00–0.50)
EOSINOPHIL %: 0 % — ABNORMAL LOW (ref 1–7)
HCT: 38.8 % (ref 31.2–41.9)
HGB: 13.3 g/dL (ref 10.9–14.3)
LYMPHOCYTE #: 0.6 x10ˆ3/uL — ABNORMAL LOW (ref 1.10–3.10)
LYMPHOCYTE %: 3 % — ABNORMAL LOW (ref 16–46)
MCH: 29.4 pg (ref 24.7–32.8)
MCHC: 34.2 g/dL (ref 32.3–35.6)
MCV: 86.1 fL (ref 75.5–95.3)
MONOCYTE #: 1 x10ˆ3/uL — ABNORMAL HIGH (ref 0.20–0.90)
MONOCYTE %: 4 % (ref 4–11)
MPV: 7.2 fL — ABNORMAL LOW (ref 7.9–10.8)
NEUTROPHIL #: 21.7 x10ˆ3/uL — ABNORMAL HIGH (ref 1.90–8.20)
NEUTROPHIL %: 93 % — ABNORMAL HIGH (ref 43–77)
PLATELETS: 564 x10ˆ3/uL — ABNORMAL HIGH (ref 140–440)
RBC: 4.51 x10ˆ6/uL (ref 3.63–4.92)
RDW: 16.1 % (ref 12.3–17.7)
WBC: 23.3 x10ˆ3/uL — ABNORMAL HIGH (ref 3.8–11.8)

## 2024-02-23 LAB — URINALYSIS, MACROSCOPIC
BILIRUBIN: NEGATIVE mg/dL
BLOOD: 0.03 mg/dL
GLUCOSE: NEGATIVE mg/dL
KETONES: NEGATIVE mg/dL
LEUKOCYTES: 25 WBCs/uL — AB
NITRITE: NEGATIVE
PH: 5.5 (ref 5.0–9.0)
PROTEIN: NEGATIVE mg/dL
SPECIFIC GRAVITY: 1.017 (ref 1.002–1.030)
UROBILINOGEN: NORMAL mg/dL

## 2024-02-23 LAB — BASIC METABOLIC PANEL
ANION GAP: 12 mmol/L (ref 4–13)
ANION GAP: 9 mmol/L (ref 4–13)
BUN/CREA RATIO: 26 — ABNORMAL HIGH (ref 6–22)
BUN/CREA RATIO: 27 — ABNORMAL HIGH (ref 6–22)
BUN: 57 mg/dL — ABNORMAL HIGH (ref 7–25)
BUN: 60 mg/dL — ABNORMAL HIGH (ref 7–25)
CALCIUM: 9.3 mg/dL (ref 8.6–10.3)
CALCIUM: 9.1 mg/dL (ref 8.6–10.3)
CHLORIDE: 94 mmol/L — ABNORMAL LOW (ref 98–107)
CHLORIDE: 96 mmol/L — ABNORMAL LOW (ref 98–107)
CO2 TOTAL: 25 mmol/L (ref 21–31)
CO2 TOTAL: 24 mmol/L (ref 21–31)
CREATININE: 2.17 mg/dL — ABNORMAL HIGH (ref 0.60–1.30)
CREATININE: 2.23 mg/dL — ABNORMAL HIGH (ref 0.60–1.30)
ESTIMATED GFR: 24 mL/min/1.73mˆ2 — ABNORMAL LOW (ref >59)
ESTIMATED GFR: 23 mL/min/1.73mˆ2 — ABNORMAL LOW (ref >59)
GLUCOSE: 138 mg/dL — ABNORMAL HIGH (ref 74–109)
GLUCOSE: 123 mg/dL — ABNORMAL HIGH (ref 74–109)
OSMOLALITY, CALCULATED: 281 mosm/kg (ref 270–290)
OSMOLALITY, CALCULATED: 277 mosm/kg (ref 270–290)
POTASSIUM: 4.1 mmol/L (ref 3.5–5.1)
POTASSIUM: 4 mmol/L (ref 3.5–5.1)
SODIUM: 131 mmol/L — ABNORMAL LOW (ref 136–145)
SODIUM: 129 mmol/L — ABNORMAL LOW (ref 136–145)

## 2024-02-23 LAB — URINALYSIS, MICROSCOPIC
HYALINE CASTS: 4 /LPF — ABNORMAL HIGH (ref <0)
RBCS: 53 /HPF — ABNORMAL HIGH (ref <4)
SQUAMOUS EPITHELIAL: 5 /HPF (ref <28)
TRANSITIONAL EPITHELIAL CELLS URINE: <1 /HPF (ref <6)
WBCS: 6 /HPF — ABNORMAL HIGH (ref <6)

## 2024-02-23 LAB — SODIUM, RANDOM URINE: SODIUM RANDOM URINE: 26 mmol/L — ABNORMAL LOW (ref 40–220)

## 2024-02-23 LAB — SCAN DIFFERENTIAL
PLATELET MORPHOLOGY COMMENT: INCREASED
RBC MORPHOLOGY COMMENT: NORMAL
SCHISTOCYTES: ABSENT

## 2024-02-23 LAB — MAGNESIUM: MAGNESIUM: 1.9 mg/dL (ref 1.9–2.7)

## 2024-02-23 MED ORDER — LACTATED RINGERS IV BOLUS
500.0000 mL | INJECTION | Freq: Once | Status: AC
Start: 2024-02-23 — End: 2024-02-23
  Administered 2024-02-23: 500 mL via INTRAVENOUS
  Administered 2024-02-23: 0 mL via INTRAVENOUS

## 2024-02-23 MED ORDER — METHYLPREDNISOLONE SOD SUCCINATE 40 MG/ML SOLUTION FOR INJ. WRAPPER
40.0000 mg | Freq: Three times a day (TID) | INTRAMUSCULAR | Status: DC
Start: 2024-02-23 — End: 2024-02-24
  Administered 2024-02-23 – 2024-02-24 (×4): 40 mg via INTRAVENOUS
  Filled 2024-02-23 (×4): qty 1

## 2024-02-23 MED ORDER — BUSPIRONE 5 MG TABLET
5.0000 mg | ORAL_TABLET | Freq: Two times a day (BID) | ORAL | Status: DC
Start: 2024-02-23 — End: 2024-02-24
  Administered 2024-02-23 – 2024-02-24 (×3): 5 mg via ORAL
  Filled 2024-02-23 (×3): qty 1

## 2024-02-23 MED ORDER — ALPRAZOLAM 0.5 MG TABLET
0.5000 mg | ORAL_TABLET | Freq: Two times a day (BID) | ORAL | Status: DC | PRN
Start: 2024-02-23 — End: 2024-02-24
  Administered 2024-02-23 – 2024-02-24 (×3): 0.5 mg via ORAL
  Filled 2024-02-23 (×3): qty 1

## 2024-02-23 NOTE — Respiratory Therapy (Signed)
 02/23/24 0833   Breath Sounds   L General Breath Sounds Diminished   R General Breath Sounds Diminished   Throughout All Lung Fields All Fields   All Lung Fields Breath Sounds Anterior:   Aerosol Therapy (SVN)   Start Time [phone]   Treatment Status Given   Route (Aerosol Therapy) mouth piece   $ Respiratory Treatment (Resp only) Neb (Sub)   Medications DuoNeb (albuterol -atrovent);Mucomyst  (Acetylcysteine );Pulmicort  (Budesonide )   Patient Position HOB elevated   Posttreatment Assessment (SVN) breath sounds unchanged   Signs of Intolerance (SVN) none   Respiratory Pre/Post-Treatment Assess   Pre-Treatment Heart Rate (beats/min) 103   Pre-Treatment Resp Rate (breaths/min) 17   Post-treatment Heart Rate (beats/min) 105   Post-treatment Resp Rate (breaths/min) 18   Device (Oxygen Therapy) nasal cannula   Flow (L/min) (Oxygen Therapy) 6   Oxygen Concentration (%) 44   RT Oxygen Therapy   Start Time 0835   Orders Updated in EMR Yes   Oxygen Check/Change Currently On   $ O2 Delivery NC   Flow (L/min) (RT) 6   FiO2 (RT) 44 %   High Flow Nasal Cannula   HFNC Status Other (Comment)  (on standby)        MetaNeb/Volara   Start Time [phone]   Treatment Status Given   $MetaNeb Charge Capture (Resp Only) Neb (Sub)   Route mouth piece   CHFO/CPEP CHFO/CPEP   Patient Tolerance good   Chest Physiotherapy (CPT)   Start Time 0837   $ CPT/PD Identifier (Resp only) S   Intermittent Positive Pressure Breathing (IPPB)   Start Time 0833   $IPPB Tx Charge (Resp only) IPPB (Sub)

## 2024-02-23 NOTE — Respiratory Therapy (Signed)
 02/23/24 0006   Non-Invasive Ventilation Assessment   Start Time 0006   Orders Updated in EMR Yes   $ NIV NIV - subsequent   NIV Status Patient Refused    $ O2 Delivery NC   Flow (L/min) (Oxygen Therapy) 5     Patient refused Bipap machine.

## 2024-02-23 NOTE — Respiratory Therapy (Signed)
 02/23/24 0429   RT Oxygen Therapy   Orders Updated in EMR Yes   $ O2 Delivery NC   Flow (L/min) (Oxygen Therapy) 5     Patient is sleeping on nasal canula. No signs of respiratory distress.

## 2024-02-23 NOTE — Respiratory Therapy (Addendum)
 Called to bedside O2 sat to 5s,  arrived to patient was on 10L oxymask. Able to get to 6L nc  1250 spo2 96% O2 at [facility]

## 2024-02-23 NOTE — Consults (Signed)
 [facility]      NEPHROLOGY CONSULT  Initial Consult Note      Date of Service:  02/23/2024   Encounter Start Date: 02/14/2024  Inpatient Admission Date:  02/14/2024    Information Obtained from: patient and history reviewed via medical record    Reason for Consultation:  AKI    HPI/Discussion:  Brooke Maxwell is a 73 y.o. female with admitted with AHRF, PNA with possible ocupational pneumonitis. Nephrology was consulted for AKI.    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  Was on ibuprofen 800 mg q6hrs for about a week       Past Medical History:   Diagnosis Date    Arthropathy     Bipolar disorder, unspecified     Depression     Kidney stone     PUD (peptic ulcer disease)     Wears dentures        Past Surgical History:   Procedure Laterality Date    ABDOMINAL HERNIA REPAIR      ABDOMINAL HYSTERECTOMY      ANKLE SURGERY Right     COLECTOMY PARTIAL / TOTAL      HX BREAST BIOPSY Left     HX CARPAL TUNNEL RELEASE Bilateral     HX CYST REMOVAL      Cyst removed from neck    LITHOTRIPSY          @Allergies [1]    Family History  Family Medical History:    None         Social History:  Family Medical History:    None          Home Medications:   Outpatient Medications Marked as Taking for the 02/14/24 encounter [facility] Encounter)   Medication Sig    amitriptyline  (ELAVIL ) 25 mg Oral Tablet Take 1 Tablet (25 mg total) by mouth Every night as needed for Insomnia    ARIPiprazole  (ABILIFY ) 30 mg Oral Tablet Take 1 Tablet (30 mg total)  by mouth Daily    busPIRone  (BUSPAR ) 10 mg Oral Tablet Take 1 Tablet (10 mg total) by mouth Twice daily    FLUoxetine  (PROZAC ) 20 mg Oral Capsule Take 1 Capsule (20 mg total) by mouth Daily    metroNIDAZOLE (FLAGYL) 500 mg Oral Tablet Take 1 Tablet (500 mg total) by mouth Every 6 hours    pantoprazole  (PROTONIX ) 40 mg Oral Tablet, Delayed Release (E.C.) Take 1 Tablet (40 mg total) by mouth Daily    rOPINIRole  (REQUIP ) 0.25 mg Oral Tablet Take 1 Tablet (0.25 mg total) by mouth Every night        Inpatient Medications:  @IPMEDS       IV Fluids, Meds, and Drips:      Diet Order:  DIET REGULAR Do you want to initiate MNT Protocol? Yes  Nutrition:    Orders Placed This Encounter   No orders of the following type(s) were placed in this encounter: Nourishments.         Review of  Systems:  The pertinent ROS as addressed in the HPI.    Exam:  Temperature: 36.6 C (97.8 F)  Heart Rate: (!) 105  BP (Non-Invasive): (!) 149/87  Respiratory Rate: 18  SpO2: 95 %    Physical Exam  General: No acute distress. On NC  Heart: Normal S1, S2  Lungs: Symmetrical air entry.  Abdomen: Soft. Non tender.   Extremities: No edema.   Skin: Normal turgor. Warm and dry.   Neuro: Intact, AAO X3.      I/O:  I/O last 24 hours:    Intake/Output Summary (Last 24 hours) at 02/23/2024 1120  Last data filed at 02/23/2024 0927  Gross per 24 hour   Intake 1319.5 ml   Output 400 ml   Net 919.5 ml     I/O current shift:  11/23 0700 - 11/23 1859  In: 499.5 [I.V.:499.5]  Out: -     Labs:    BMP (Last 48 Hours):    Recent Results in last 48 hours     02/22/24  0805 02/23/24  0440   SODIUM 133* 131*   POTASSIUM 3.9 4.1   CHLORIDE 95* 94*   CO2 24 25   BUN 41* 57*   CREATININE 1.39* 2.17*   CALCIUM 9.6 9.3   GLUCOSENF 166* 138*       COMPLETE BLOOD COUNT   Lab Results   Component Value Date    WBC 23.3 (H) 02/23/2024    HGB 13.3 02/23/2024    HCT 38.8 02/23/2024    PLTCNT 564 (H) 02/23/2024       DIFFERENTIAL  Lab Results   Component Value Date    PMNS 93 (H)  02/23/2024    LYMPHOCYTES 3 (L) 02/23/2024    MONOCYTES 4 02/23/2024    EOSINOPHIL 0 (L) 02/23/2024    BASOPHILS 0 02/23/2024    BASOPHILS 0.00 02/23/2024    PMNABS 21.70 (H) 02/23/2024    LYMPHSABS 0.60 (L) 02/23/2024    EOSABS 0.00 02/23/2024    MONOSABS 1.00 (H) 02/23/2024              Impression/Recommendations:   Active Hospital Problems    Diagnosis    Primary Problem: NSTEMI (non-ST elevated myocardial infarction) (CMS HCC)    Acute on chronic heart failure with preserved ejection fraction (HFpEF)    Hypoxia    Elevated troponin    CAP (community acquired pneumonia)    Anxiety and depression    Acute respiratory failure    Hypomagnesemia    D-dimer, elevated       73 y.o. female with admitted with AHRF, PNA with possible ocupational pneumonitis requiring IV diuretics now c/b AKI.      #AKI likely over-diuresis  -Baseline creatinine: < 1  -Creatinine rise noted on 11/21 :  0.83-> 1.39-> 2.17  -Renal failure most likely due to over-diuresis  -Contrast exposure on 02/14/2024 for CTA PE study, however does not fit timeline for current AKI  -Required IV lasix  40 mg from 11/14-11/21  -CT imaging 11/21 showing kidneys with worsening of moderate left hydronephrosis  -non-oliguric      plan  -No acute HD need at this time  -Holding diuretics for today  -s/p IVF LR bolus this AM  -checking urinalysis with urine sodium  -checking dedicated kidney ultrasound  -Obtain daily BMP to monitor renal function, phosphorus  -Will monitor kidney function and urine output closely to assess for renal  -Avoid hypotension, keep MAP more than 65  -Avoid nephrotoxins  as able-no IV contrast, NSAIDs, fleets, baclofen     -Avoid morphine  use in the setting of AKI    -Monitor I/O, daily weights   -Dose meds for AKI/GFR < 24.    -Please see orders for further instructions.      #Acidosis  -bicarb of 25      #Hyperkalemia  -K of 4.1          MY ORDERS LAST 24 (24h ago, onward)       Start     Ordered    02/23/24 1115  SODIUM, RANDOM URINE  ONE  TIME         02/23/24 1113    02/23/24 1115  URINALYSIS, MACROSCOPIC AND MICROSCOPIC  ONE TIME         02/23/24 1113    02/23/24 1115  URINALYSIS, MACROSCOPIC  PROCEDURE ONCE         02/23/24 1113    02/23/24 1115  URINALYSIS, MICROSCOPIC  PROCEDURE ONCE         02/23/24 1113    Unscheduled  US  KIDNEY  ONE TIME (IMAGING ONLY)         02/23/24 1118                      Suzann Gustin, MD                 [1]   Allergies  Allergen Reactions    Cefaclor Anaphylaxis    Citalopram  Mental Status Effect and Itching

## 2024-02-23 NOTE — Respiratory Therapy (Signed)
 02/23/24 0545   Non-Invasive Ventilation Assessment   Start Time 0545   Orders Updated in EMR Yes   $ NIV NIV - subsequent   NIV Status Patient Refused    $ O2 Delivery NC   Flow (L/min) (Oxygen Therapy) 5     Patient was on nasal canula all night.  No signs of respiratory distress.  Currently being cleaned by PCA.

## 2024-02-23 NOTE — Respiratory Therapy (Signed)
 02/23/24 0930   RT Oxygen Therapy   Start Time 0930   Orders Updated in EMR Yes   Oxygen Check/Change Currently On   $ O2 Delivery NC   Flow (L/min) (RT) 4   SpO2 95 %

## 2024-02-23 NOTE — Progress Notes (Signed)
 [place] MEDICINE [facility]    HOSPITALIST PROGRESS NOTE    Brooke Maxwell  Date of service: 02/23/2024  Date of Admission:  02/14/2024  Hospital Day:  LOS: 9 days     Interval History:   Patient was seen and evaluated at bedside   Patient was finally weaned off of high-flow down to nasal cannula and currently saturating well on 5 L nasal cannula      Intake & Output:    Intake/Output Summary (Last 24 hours) at 02/23/2024 1003  Last data filed at 02/23/2024 9072  Gross per 24 hour   Intake 1319.5 ml   Output 400 ml   Net 919.5 ml       Vital Signs:  Filed Vitals:    02/22/24 2152 02/22/24 2344 02/23/24 0744 02/23/24 0930   BP:  127/82 (!) 149/87    Pulse: (!) 102 99 (!) 105    Resp:  18 18    Temp:  36.7 C (98 F) 36.6 C (97.8 F)    SpO2: 98% 90% 91% 95%        acetylcysteine  (MUCOMYST ) 20% nebulized solution, 4 mL, Nebulization, 3x/day  albuterol  (PROVENTIL ) 2.5 mg / 3 mL (0.083%) neb solution, 2.5 mg, Nebulization, Q4H PRN  ALPRAZolam  (XANAX ) tablet, 0.5 mg, Oral, Daily PRN  amitriptyline  (ELAVIL ) tablet, 25 mg, Oral, HS PRN  ARIPiprazole  (ABILIFY ) tablet, 30 mg, Oral, Daily  aspirin  chewable tablet 81 mg, 81 mg, Oral, Daily  atorvastatin  (LIPITOR) tablet, 80 mg, Oral, QPM  budesonide  (PULMICORT  RESPULES) 0.5 mg/2 mL nebulizer suspension, 1 mg, Nebulization, 2x/day  busPIRone  (BUSPAR ) tablet, 5 mg, Oral, 2x/day  cyclobenzaprine  (FLEXERIL ) tablet, 10 mg, Oral, 3x/day PRN  enoxaparin  PF (LOVENOX ) 40 mg/0.4 mL SubQ injection, 40 mg, Subcutaneous, Daily  FLUoxetine  (PROzac ) capsule, 20 mg, Oral, Daily  ipratropium-albuterol  0.5 mg-3 mg(2.5 mg base)/3 mL Solution for Nebulization, 3 mL, Nebulization, 4x/day  levoFLOXacin  (LEVAQUIN ) tablet, 750 mg, Oral, Q24H  methylPREDNISolone  sod succ (SOLU-medrol ) 40 mg/mL injection, 40 mg, Intravenous, Q8H  ondansetron  (ZOFRAN ) 2 mg/mL injection, 4 mg, Intravenous, Q8H PRN  pantoprazole  (PROTONIX ) delayed release tablet, 40 mg, Oral, Daily  perflutren  lipid  microspheres (DEFINITY ) 1.3 mL in NS 10 mL (tot vol) injection, 2 mL, Intravenous, Cardiology Once PRN  rOPINIRole  (REQUIP ) tablet, 0.25 mg, Oral, NIGHTLY  zinc  oxide 56.7 g, clotrimazole (LOTRIMIN) 15 g, hydrocortisone (CORTIZONE-10) 28 g compounded topical cream, , Apply Topically, Q30 Min PRN           Assessment/ Plan:     # possible pneumonitis   Did have exposure to strong fumes of bleach while she was cleaning  We will start her on IV steroids and see if she responds to that   Increased the steroids  Did consult pulmonology at [facility] who recommended to continue IV steroids and wean her off of the high-flow and they suggested that if no change by Monday they would re-evaluate for possible bronchoscopy  Was off of high-flow earlier this morning and currently saturating well on 5 L nasal cannula   We will start tapering the steroids     #acute hypoxemic respiratory failure --improving  could be from the above   Was requiring high-flow nasal cannula now transitioned nasal cannula     #AKI   Could be from diuresis   We will give small bolus of LR and re-evaluate kidney function   Consulted Nephrology     #Leukocytosis likely from steroids     #?  Community-acquired pneumonia  Infectious workup has been negative so far   We will complete the course of levofloxacin      #Demand ischemia   Likely secondary from the respiratory distress  Troponins elevated and downtrending   No ST segment or T-wave changes in the EKG  Cardiology was consulted     #Elevated D-dimer   CT chest negative for any PE   Could be secondary from inflammation      #history of anxiety and depression   Continue home medication        Disposition Planning:  Pending clinical course        IVF:  None  Diet:  Regular  Gastric Ulcer Prophylaxis:  Pantoprazole    Bowel Regimen:  None    DVT/PE Prophylaxis :  Lovenox     Daily Orders (From admission, onward)       Start     Ordered    02/14/24 2245  PT IS INTERMEDIATE RISK FOR VENOUS THROMBOEMBOLISM   (MEDIUM RISK VTE LEVEL)  CONTINUOUS        References:    VTE RISK ASSESSMENT TOOL    02/14/24 2243                   Lines/Drains:         On the day of the encounter, a total of 25 minutes was spent on this patient encounter including review of historical information, examination, documentation and post-visit activities. The time documented excludes procedural time and time spent on wellness portion of the visit.     [name] [name], MD  02/23/2024  [facility] MEDICINE HOSPITALIST

## 2024-02-23 NOTE — Respiratory Therapy (Signed)
 02/23/24 0006   RT Oxygen Therapy   Orders Updated in EMR Yes   $ O2 Delivery NC   Flow (L/min) (Oxygen Therapy) 5   High Flow Nasal Cannula   Start Time 0008  (Patient refused HFNC.)   Order Updated in EMR Yes   $Ventilator Charges$   $ NIV NIV - subsequent

## 2024-02-23 NOTE — Respiratory Therapy (Signed)
 02/23/24 0155   Non-Invasive Ventilation Assessment   Start Time 0156   Orders Updated in EMR Yes   $ NIV NIV - subsequent   NIV Status Currently On   $ O2 Delivery NC   Flow (L/min) (Oxygen Therapy) 5     Patient sleeping on  5L/nasal canula, Spo2 96%

## 2024-02-24 ENCOUNTER — Inpatient Hospital Stay (HOSPITAL_COMMUNITY)

## 2024-02-24 DIAGNOSIS — I110 Hypertensive heart disease with heart failure: Secondary | ICD-10-CM

## 2024-02-24 DIAGNOSIS — N3289 Other specified disorders of bladder: Secondary | ICD-10-CM

## 2024-02-24 DIAGNOSIS — F319 Bipolar disorder, unspecified: Secondary | ICD-10-CM

## 2024-02-24 DIAGNOSIS — J984 Other disorders of lung: Secondary | ICD-10-CM

## 2024-02-24 DIAGNOSIS — R339 Retention of urine, unspecified: Secondary | ICD-10-CM

## 2024-02-24 LAB — BASIC METABOLIC PANEL
ANION GAP: 14 mmol/L — ABNORMAL HIGH (ref 4–13)
BUN/CREA RATIO: 28 — ABNORMAL HIGH (ref 6–22)
BUN: 67 mg/dL — ABNORMAL HIGH (ref 7–25)
CALCIUM: 9.3 mg/dL (ref 8.6–10.3)
CHLORIDE: 96 mmol/L — ABNORMAL LOW (ref 98–107)
CO2 TOTAL: 18 mmol/L — ABNORMAL LOW (ref 21–31)
CREATININE: 2.42 mg/dL — ABNORMAL HIGH (ref 0.60–1.30)
ESTIMATED GFR: 21 mL/min/1.73mˆ2 — ABNORMAL LOW (ref >59)
GLUCOSE: 126 mg/dL — ABNORMAL HIGH (ref 74–109)
OSMOLALITY, CALCULATED: 278 mosm/kg (ref 270–290)
POTASSIUM: 4.1 mmol/L (ref 3.5–5.1)
SODIUM: 128 mmol/L — ABNORMAL LOW (ref 136–145)

## 2024-02-24 LAB — CBC
HCT: 38.7 % (ref 31.2–41.9)
HGB: 12.9 g/dL (ref 10.9–14.3)
MCH: 28.6 pg (ref 24.7–32.8)
MCHC: 33.4 g/dL (ref 32.3–35.6)
MCV: 85.7 fL (ref 75.5–95.3)
MPV: 7.1 fL — ABNORMAL LOW (ref 7.9–10.8)
PLATELETS: 565 x10ˆ3/uL — ABNORMAL HIGH (ref 140–440)
RBC: 4.51 x10ˆ6/uL (ref 3.63–4.92)
RDW: 16.2 % (ref 12.3–17.7)
WBC: 26.8 x10ˆ3/uL — ABNORMAL HIGH (ref 3.8–11.8)

## 2024-02-24 LAB — MAGNESIUM: MAGNESIUM: 2.1 mg/dL (ref 1.9–2.7)

## 2024-02-24 LAB — ANA SCREEN BY IFA WITH REFLEX TO TITER/PATTERN AND SEROLOGY CASCADE, SERUM: ANA INTERPRETATION: NEGATIVE

## 2024-02-24 LAB — ALPHA-1-ANTITRYPSIN: ALPHA 1 ANTITRYPSIN: 172 mg/dL (ref 90–200)

## 2024-02-24 MED ORDER — METHYLPREDNISOLONE SOD SUCCINATE 40 MG/ML SOLUTION FOR INJ. WRAPPER: 40.0000 mg | Freq: Three times a day (TID) | INTRAMUSCULAR | Status: DC

## 2024-02-24 MED ORDER — LEVOFLOXACIN 750 MG TABLET
750.0000 mg | ORAL_TABLET | ORAL | Status: DC
Start: 2024-02-26 — End: 2024-02-24

## 2024-02-24 MED ORDER — ETHYL ALCOHOL 62 % TOPICAL SWAB
1.0000 | Freq: Two times a day (BID) | CUTANEOUS | Status: DC
Start: 2024-02-24 — End: 2024-02-24
  Administered 2024-02-24: 1 via NASAL

## 2024-02-24 MED ORDER — DOCUSATE SODIUM 100 MG CAPSULE
100.0000 mg | ORAL_CAPSULE | Freq: Two times a day (BID) | ORAL | Status: DC
Start: 2024-02-24 — End: 2024-02-24
  Administered 2024-02-24: 100 mg via ORAL
  Filled 2024-02-24: qty 1

## 2024-02-24 MED ORDER — SODIUM BICARBONATE 1 MEQ/ML (8.4 %) INTRAVENOUS SOLUTION
INTRAVENOUS | Status: DC
Start: 2024-02-24 — End: 2024-02-24
  Filled 2024-02-24 (×3): qty 150

## 2024-02-24 MED ORDER — IPRATROPIUM 0.5 MG-ALBUTEROL 3 MG (2.5 MG BASE)/3 ML NEBULIZATION SOLN
3.0000 mL | INHALATION_SOLUTION | Freq: Four times a day (QID) | RESPIRATORY_TRACT | Status: AC
Start: 2024-02-24 — End: ?

## 2024-02-24 MED ORDER — ATORVASTATIN 80 MG TABLET
80.0000 mg | ORAL_TABLET | Freq: Every evening | ORAL | Status: AC
Start: 2024-02-24 — End: ?

## 2024-02-24 MED ORDER — ASPIRIN 81 MG CHEWABLE TABLET
81.0000 mg | CHEWABLE_TABLET | Freq: Every day | ORAL | Status: AC
Start: 2024-02-25 — End: ?

## 2024-02-24 MED ORDER — ALBUMIN, HUMAN 25 % INTRAVENOUS SOLUTION
25.0000 g | Freq: Once | INTRAVENOUS | Status: AC
Start: 2024-02-24 — End: 2024-02-24
  Administered 2024-02-24: 25 g via INTRAVENOUS
  Administered 2024-02-24: 0 g via INTRAVENOUS
  Filled 2024-02-24: qty 100

## 2024-02-24 NOTE — Nurses Notes (Signed)
 Report called to Harlene, RN at [facility]. Patient will be going to room 342. Dr. Retia is accepting physician. PRS called for transport. Number for report [phone]

## 2024-02-24 NOTE — Nurses Notes (Signed)
 PRS here to transport patient to LTAC in [place], Pump provided to continue LR @ 75 for transport to new facility. No s/s of distress noted at this time. Foley emptied prior to leaving.

## 2024-02-24 NOTE — Respiratory Therapy (Signed)
Chest xray reviewed. 

## 2024-02-24 NOTE — Nurses Notes (Signed)
 Patient to be discharged to [facility] facility. Leita Philips, RN called report and scheduled transport. IVs and tele still intact. Patient is awaiting transport at this time.

## 2024-02-24 NOTE — Care Management Notes (Addendum)
 Received t/c from [facility] with [facility] who advised patient continues to meet criteria for [facility] placement this am due to having a baseline Oxygen 5 lpm via nc with no prior use of O2 therapy.     Information will be provided to physician for consideration.     11:30 am    Per Physician, patient can be discharged to [facility] hospital.     Notified [facility].     Notified patient who is alert and oriented this am.  Patient is in agreement.     Notified daughter, Sonny Holms, via t/c who is also in agreement.      3:45 PM    Received t/c from [facility] who advised patient refused to give consent for transfer to [facility].     CM presented to patient room and made telephone call to [facility] [phone] who is the acting liaison for placement at [facility].  Her name is Jodi.    Speaker phone was initiated for patient to ask questions, etc.    Patient gave consent for [facility] placement.   Leita Philips notified and provided number of liaison.

## 2024-02-24 NOTE — Discharge Summary (Signed)
 [place] MEDICINE [facility]     DISCHARGE SUMMARY      PATIENT NAME:  Brooke Maxwell   MRN:  [id number]  DOB:  [birth year]    INPATIENT ADMISSION DATE: 02/14/2024   DATE OF DISCHARGE:  02/24/2024     ATTENDING PHYSICIAN:  Lendia Riesa Skiff, MD    HOSPITAL PRESENTATION:  Please see full admission H&P for details.    As per HPI:    73 year old female with PMH of depression/anxiety who presented to the [facility] ER for shortness of breath. Patient reported that the condition started since yesterday by shortness of breath, worsened with minimal exertion accompanied by coughing with expectoration.     HOSPITAL COURSE:    In the ED patient was tachypneic. She needed to be on 4 L nasal cannula. WBC is elevated at 17.5, D-dimer was elevated to 1233, magnesium  1.3, troponin was elevated to 232, BNP was elevated to 1454.  EKG showed LVH, there is no ST-T wave changes. CTA of the chest was negative for PE, but positive for bilateral ground-glass pulmonary opacity.  Initially admitted on 02/14/2024.  Initially was requiring 4-5 L nasal cannula.  Initial workup for infectious process including respiratory panel, Legionella, strep, sputum culture were all negative.  Procalcitonin was within normal limits.  Respiratory status worsened to patient requiring high-flow nasal cannula.  The ground-glass opacities bilaterally was thought to be likely pneumonitis chemical versus hypersensitive?  Started on high dose steroids and everyday attempts were made to wean her high-flow.  Patient remained on the high-flow for quite a few days and then was finally down to nasal cannula requiring 5 L. but she continues to desaturate on movement.  Was evaluated for long-term acute care for further respiratory care and was accepted.  Continues to be on tapering dose of steroids.  Leukocytosis likely secondary from that.  During the hospital stay developed urinary retention causing hydronephrosis and AKI.  Foley catheter was placed for  that.    Cardio consult:  Recommended that non ST elevation myocardial infarction type 2 likely in the setting of uncontrolled hypertension and acute hypoxemic respiratory failure.  Echo was ordered which showed EF of 60% with no diastolic dysfunction      Discharge Diagnosis:    # acute hypoxemic respiratory failure   # pneumonitis   #Urinary retention status post Foley catheter insertion (02/24/2024)  # non ST elevation myocardial infarction type 2   # hypertension   #depression/anxiety    PROBLEM LIST:  Active Hospital Problems    Diagnosis Date Noted    Principal Problem: NSTEMI (non-ST elevated myocardial infarction) (CMS HCC) [I21.4] 02/14/2024    Acute kidney injury (CMS HCC) [N17.9] 02/23/2024    Acute on chronic heart failure with preserved ejection fraction (HFpEF) [I50.33] 02/16/2024    Hypoxia [R09.02] 02/16/2024    Elevated troponin [R79.89] 02/16/2024    CAP (community acquired pneumonia) [J18.9] 02/14/2024    Anxiety and depression [F41.9, F32.A] 02/14/2024    Acute respiratory failure [J96.00] 02/14/2024    Hypomagnesemia [E83.42] 02/14/2024    D-dimer, elevated [R79.89] 07/27/2022      Resolved Hospital Problems   No resolved problems to display.     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 Yes             Additional clinical characteristics related to nutrition:    - monitor for  weight changes   - monitor intake and output    - monitor bowel functions        Lab Results   Component Value Date    ALBUMIN  4.0 02/22/2024        PHYSICAL EXAM DAY OF DISCHARGE:  BP 129/78   Pulse (!) 103   Temp 36.8 C (98.2 F)   Resp 18   Ht 1.549 m (5' 1)   Wt 94.2 kg (207 lb 9.6 oz)   SpO2 92%   BMI 39.23 kg/m        Physical Exam    LABS WITHIN LAST 24 HOURS:   Results for orders placed or performed during the hospital encounter of 02/14/24 (from the past 24 hours)   SODIUM, RANDOM URINE   Result Value Ref Range    SODIUM RANDOM URINE 26 (L) 40 - 220 mmol/L   URINALYSIS, MACROSCOPIC   Result Value Ref Range    COLOR Light Yellow Colorless, Light Yellow, Yellow    APPEARANCE Turbid (A) Clear    SPECIFIC GRAVITY 1.017 1.002 - 1.030    PH 5.5 5.0 - 9.0    LEUKOCYTES 25 (A) Negative, 100  WBCs/uL    NITRITE Negative Negative    PROTEIN Negative Negative, 10 , 20  mg/dL    GLUCOSE Negative Negative, 30  mg/dL    KETONES Negative Negative, Trace mg/dL    BILIRUBIN Negative Negative, 0.5 mg/dL    BLOOD 9.96 Negative, 0.03 mg/dL    UROBILINOGEN Normal Normal mg/dL   URINALYSIS, MICROSCOPIC   Result Value Ref Range    BACTERIA Rare (A) Negative /hpf    MUCOUS Rare Rare, Occasional, Few /hpf    BUDDING YEAST Many (A) (none) /hpf    RBCS 53 (H) <4 /hpf    WBCS 6 (H) <6 /hpf    HYALINE CASTS 4 (H) <0 /lpf    SQUAMOUS EPITHELIAL 5 <28 /hpf    TRANSITIONAL EPITHELIAL CELLS URINE <1 <6 /hpf   CBC   Result Value Ref Range    WBC 26.8 (H) 3.8 - 11.8 x10^3/uL    RBC 4.51 3.63 - 4.92 x10^6/uL    HGB 12.9 10.9 - 14.3 g/dL    HCT 61.2 68.7 - 58.0 %    MCV 85.7 75.5 - 95.3 fL    MCH 28.6 24.7 - 32.8 pg    MCHC 33.4 32.3 - 35.6 g/dL    RDW 83.7 87.6 - 82.2 %    PLATELETS 565 (H) 140 - 440 x10^3/uL    MPV 7.1 (L) 7.9 - 10.8 fL   BASIC METABOLIC PANEL   Result Value Ref Range    SODIUM 128 (L) 136 - 145 mmol/L    POTASSIUM 4.1 3.5 - 5.1 mmol/L    CHLORIDE 96 (L) 98 - 107 mmol/L    CO2 TOTAL 18 (L) 21 - 31 mmol/L    ANION  GAP 14 (H) 4 - 13 mmol/L  CALCIUM 9.3 8.6 - 10.3 mg/dL    GLUCOSE 873 (H) 74 - 109 mg/dL    BUN 67 (H) 7 - 25 mg/dL    CREATININE 7.57 (H) 0.60 - 1.30 mg/dL    BUN/CREA RATIO 28 (H) 6 - 22    ESTIMATED GFR 21 (L) >59 mL/min/1.110m^2    OSMOLALITY, CALCULATED 278 270 - 290 mOsm/kg   MAGNESIUM    Result Value Ref Range    MAGNESIUM  2.1 1.9 - 2.7 mg/dL        IMAGING WITHIN LAST 24 HOURS:   No results found.  CT ABDOMEN PELVIS WO IV CONTRAST   Final Result      Exam is limited by motion.      1. Mild-moderate bilateral hydroureteronephrosis without radiopaque obstructing stone. Markedly distended urinary bladder, likely the etiology. Please correlate for urinary retention. Consider follow-up renal and bladder ultrasound after bladder emptying or decompression by Foley.      2. Few loops of distended small bowels in the left abdomen concerning for partial obstruction. Please correlate with symptoms.         Radiologist location ID: [id number]         US  KIDNEY   Final Result   MODERATE BILATERAL HYDRONEPHROSIS.               Radiologist location ID: [id number]         CT CHEST WO IV CONTRAST   Final Result   NO CHANGE IN THE GEOGRAPHIC AREAS OF GROUNDGLASS OPACITY IN THE LUNGS. COULD BE FROM ATYPICAL PNEUMONIA, NONCARDIOGENIC EDEMA OR ACUTE LUNG INJURY.      WORSENING OF MODERATE LEFT HYDRONEPHROSIS. CAUSE IS NOT VISIBLE.               Radiologist location ID: [id number]         XR AP MOBILE CHEST   Final Result   LOW LUNG VOLUMES WITH BILATERAL INCREASED INTERSTITIAL MARKINGS. THERE IS SUPERIMPOSED BILATERAL AIRSPACE DISEASE, LEFT GREATER THAN RIGHT, GROSSLY UNCHANGED.                   Radiologist location ID: [id number]         XR CHEST AP   Final Result   Bilateral predominantly interstitial infiltrates are again noted which appear unchanged since the most recent previous examination.            Radiologist location ID: [id number]         CT ANGIO CHEST FOR PULMONARY EMBOLUS W IV CONTRAST   Final Result       NO EVIDENCE OF ACUTE CENTRAL THROMBOEMBOLIC DISEASE.      NONSPECIFIC FAIRLY EXTENSIVE BILATERAL GROUNDGLASS PULMONARY OPACITIES THAT COULD BE INFLAMMATORY/INFECTIOUS                  Radiologist location ID: [id number]         XR AP MOBILE CHEST   Final Result      Bilateral hazy opacity, could be due to atypical/viral pneumonia or pulmonary edema.         Radiologist location ID: [id number]              MICROBIOLOGY WITHIN LAST 24 HOURS:   No results found for any visits on 02/14/24 (from the past 24 hours).     DISCHARGE MEDICATIONS:     Current Discharge Medication List        START taking these medications.        Details   aspirin  81  mg Tablet, Chewable  Start taking on: February 25, 2024   81 mg, Oral, Daily  Refills: 0     atorvastatin  80 mg Tablet  Commonly known as: LIPITOR   80 mg, Oral, EVERY EVENING  Refills: 0     ipratropium-albuteroL  0.5 mg-3 mg(2.5 mg base)/3 mL nebulizer solution  Commonly known as: DUONEB   3 mL, Nebulization, 4 TIMES DAILY  Refills: 0     methylPREDNISolone  40 mg/mL Recon Soln  Commonly known as: SOLU-medrol    40 mg, Intravenous, EVERY 8 HOURS  Refills: 0            CONTINUE these medications - NO CHANGES were made during your visit.        Details   amitriptyline  25 mg Tablet  Commonly known as: ELAVIL    25 mg, NIGHTLY PRN  Refills: 0     ARIPiprazole  30 mg Tablet  Commonly known as: ABILIFY    1 Tablet, Daily  Refills: 0     busPIRone  10 mg Tablet  Commonly known as: BUSPAR    10 mg, 2 TIMES DAILY  Refills: 0     FLUoxetine  20 mg Capsule  Commonly known as: PROzac    20 mg, Daily  Refills: 0     ondansetron  4 mg Tablet, Rapid Dissolve  Commonly known as: ZOFRAN  ODT   4 mg, Oral, EVERY 8 HOURS PRN  Qty: 12 Tablet  Refills: 0     pantoprazole  40 mg Tablet, Delayed Release (E.C.)  Commonly known as: PROTONIX    40 mg, Daily  Refills: 0     rOPINIRole  0.25 mg Tablet  Commonly known as: REQUIP    0.25 mg, NIGHTLY  Refills: 0            STOP taking these medications.       cyclobenzaprine  10 mg Tablet  Commonly known as: FLEXERIL      dicyclomine  10 mg Capsule  Commonly known as: BENTYL      lisinopriL  20 mg Tablet  Commonly known as: PRINIVIL      metroNIDAZOLE 500 mg Tablet  Commonly known as: FLAGYL             DISCHARGE DISPOSITION:  [facility]    DISCHARGE INSTRUCTIONS:  No discharge procedures on file.   Follow-up Information       Pcp, No Follow up.               Tobie Beauvais, MD Follow up.    Specialty: PULMONARY DISEASE  Why: Possible pneumonitis is currently requiring highflow  Contact information:  [address]  [place] [address]  [phone]                                Copies sent to Care Team         Relationship Specialty Notifications Start End    Pcp, No PCP - General   11/11/23             The hospitalist examined patient, reviewed material, and agreed with discharge at this time.   >30 minutes total were spent coordinating discharge day today    Marletta Bousquet [name], MD  [facility] Medicine   02/24/2024      This note was partially created using voice recognition software and is inherently subject to errors including those of syntax and sound alike  substitutions which may escape proof reading. In such instances, original meaning may be extrapolated by contextual derivation.

## 2024-02-24 NOTE — Consults (Signed)
 [facility]        PULMONARY MEDICINE CONSULTATION NOTE    Brooke Maxwell, Brooke Maxwell, 73 y.o. female  Date of Birth:  1950/12/30  Encounter Start Date:  02/14/2024  Inpatient Admission Date: 02/14/2024  Date of service: 02/24/2024    Service: Pulmonary Medicine  Requesting MD: Dr Lendia    Reason for consultation:  Abnormal CAT scan of the chest    HPI:  Brooke Maxwell is a 73 y.o. female had pneumonia in the past denies any history of asthma Chronic Obstructive Pulmonary Disease does not use any respiratory medicines at home currently in the hospital because of worsening shortness of a chest tightness cough with whitish yellowish expectoration without any fever chills palpitation or hemoptysis, does have renal insufficiency, cat scan of the chest showed evidence of bilateral ground-glass opacity.    Historical Data   Past Medical History:   Diagnosis Date    Arthropathy     Bipolar disorder, unspecified     Depression     Kidney stone     PUD (peptic ulcer disease)     Wears dentures      Past Surgical History:   Procedure Laterality Date    ABDOMINAL HERNIA REPAIR      ABDOMINAL HYSTERECTOMY      ANKLE SURGERY Right     COLECTOMY PARTIAL / TOTAL      HX BREAST BIOPSY Left     HX CARPAL TUNNEL RELEASE Bilateral     HX CYST REMOVAL      Cyst removed from neck    LITHOTRIPSY           Allergies[1]  Family History  Noncontributory   Social History  Social History[2]         Medications Prior to Admission       Prescriptions    amitriptyline  (ELAVIL ) 25 mg Oral Tablet    Take 1 Tablet (25 mg total) by mouth Every night as needed for Insomnia    ARIPiprazole  (ABILIFY ) 30 mg Oral Tablet    Take 1 Tablet (30 mg total) by mouth Daily    busPIRone  (BUSPAR ) 10 mg Oral Tablet    Take 1 Tablet (10 mg total) by mouth Twice daily    cyclobenzaprine  (FLEXERIL ) 10 mg Oral Tablet    Take 1 Tablet (10 mg total) by mouth Three times a day as needed for Muscle spasms    Patient not taking:  Reported on 02/14/2024     dicyclomine  (BENTYL ) 10 mg Oral Capsule    Take 1 Capsule (10 mg total) by mouth Four times a day    Patient not taking:  Reported on 02/14/2024    FLUoxetine  (PROZAC ) 20 mg Oral Capsule    Take 1 Capsule (20 mg total) by mouth Daily    lisinopriL  (PRINIVIL ) 20 mg Oral Tablet    Take 1 Tablet (20 mg total) by mouth Once a day    Patient not taking:  Reported on 02/14/2024    metroNIDAZOLE (FLAGYL) 500 mg Oral Tablet    Take 1 Tablet (500 mg total) by mouth Every 6 hours    ondansetron  (ZOFRAN  ODT) 4 mg Oral Tablet, Rapid Dissolve    Take 1 Tablet (4 mg total) by mouth Every 8 hours as needed for Nausea/Vomiting    pantoprazole  (PROTONIX ) 40 mg Oral Tablet, Delayed Release (E.C.)    Take 1 Tablet (40 mg total) by mouth Daily    rOPINIRole  (REQUIP ) 0.25 mg Oral Tablet  Take 1 Tablet (0.25 mg total) by mouth Every night          acetylcysteine  (MUCOMYST ) 20% nebulized solution, 4 mL, Nebulization, 3x/day  albuterol  (PROVENTIL ) 2.5 mg / 3 mL (0.083%) neb solution, 2.5 mg, Nebulization, Q4H PRN  ALPRAZolam  (XANAX ) tablet, 0.5 mg, Oral, Q12H PRN  amitriptyline  (ELAVIL ) tablet, 25 mg, Oral, HS PRN  ARIPiprazole  (ABILIFY ) tablet, 30 mg, Oral, Daily  aspirin  chewable tablet 81 mg, 81 mg, Oral, Daily  atorvastatin  (LIPITOR) tablet, 80 mg, Oral, QPM  budesonide  (PULMICORT  RESPULES) 0.5 mg/2 mL nebulizer suspension, 1 mg, Nebulization, 2x/day  busPIRone  (BUSPAR ) tablet, 5 mg, Oral, 2x/day  cyclobenzaprine  (FLEXERIL ) tablet, 10 mg, Oral, 3x/day PRN  D5W 1,000 mL with sodium bicarbonate  150 mEq infusion, , Intravenous, Continuous  docusate sodium  (COLACE) capsule, 100 mg, Oral, 2x/day  enoxaparin  PF (LOVENOX ) 40 mg/0.4 mL SubQ injection, 40 mg, Subcutaneous, Daily  FLUoxetine  (PROzac ) capsule, 20 mg, Oral, Daily  ipratropium-albuterol  0.5 mg-3 mg(2.5 mg base)/3 mL Solution for Nebulization, 3 mL, Nebulization, 4x/day  levoFLOXacin  (LEVAQUIN ) tablet, 750 mg, Oral, Q24H  methylPREDNISolone  sod succ (SOLU-medrol ) 40 mg/mL  injection, 40 mg, Intravenous, Q8H  ondansetron  (ZOFRAN ) 2 mg/mL injection, 4 mg, Intravenous, Q8H PRN  pantoprazole  (PROTONIX ) delayed release tablet, 40 mg, Oral, Daily  perflutren  lipid microspheres (DEFINITY ) 1.3 mL in NS 10 mL (tot vol) injection, 2 mL, Intravenous, Cardiology Once PRN  rOPINIRole  (REQUIP ) tablet, 0.25 mg, Oral, NIGHTLY  zinc  oxide 56.7 g, clotrimazole (LOTRIMIN) 15 g, hydrocortisone (CORTIZONE-10) 28 g compounded topical cream, , Apply Topically, Q30 Min PRN      Active Orders   Microbiology    RESPIRATORY CULTURE AND GRAM STAIN, AEROBIC     Frequency: ONE TIME     Number of Occurrences: 1 Occurrences    SPUTUM SCREEN     Frequency: Once     Number of Occurrences: 1 Occurrences   Lab    HUMAN METAPNEUMOVIRUS BY PCR ([facility])     Frequency: ONE TIME     Number of Occurrences: 1 Occurrences   Diet    DIET REGULAR Do you want to initiate MNT Protocol? Yes     Frequency: All Meals     Number of Occurrences: 1 Occurrences   Nursing    ACTIVITY     Frequency: UNTIL DISCONTINUED     Number of Occurrences: Until Specified    CONTINUOUS CARDIAC MONITORING (ED USE ONLY)     Frequency: ONE TIME     Number of Occurrences: 1 Occurrences    INCENTIVE SPIROMETRY NURSING     Frequency: Q1H WA     Number of Occurrences: 250 Occurrences    Notify MD Vital Signs     Frequency: PRN     Number of Occurrences: Until Specified    PT IS INTERMEDIATE RISK FOR VENOUS THROMBOEMBOLISM     Frequency: CONTINUOUS     Number of Occurrences: Until Specified    PULSE OXIMETRY Q4H     Frequency: Q4H     Number of Occurrences: Until Specified    TELEMETRY MONITORING     Frequency: CONTINUOUS     Number of Occurrences: Until Specified    VITAL SIGNS  Q4H     Frequency: Q4H     Number of Occurrences: Until Specified   Code Status    FULL CODE: ATTEMPT RESUSCITATION / CPR     Frequency: CONTINUOUS     Number of Occurrences: Until Specified     Order Comments: Patient wishes for full  ICU level care including advanced airway  interventions / mechanical ventilation.     In the event of pulseless cardiac arrest, patient consents to ACLS (advanced cardiac life support) to attempt resuscitation.  IE - Consents to chest compressions, life support including intubation, mechanical ventilation, defibrillation/cardioversion as indicated.         Consult    IP CONSULT TO NEPHROLOGY     Frequency: ONE TIME     Number of Occurrences: 1 Occurrences    IP CONSULT TO PULMONOLOGY On-Call Provider (nurse/clerk to determine)     Frequency: ONE TIME     Number of Occurrences: 1 Occurrences    IP CONSULT TO [facility] - TELEMEDICINE     Frequency: ONE TIME     Number of Occurrences: 1 Occurrences   OT    OT EVAL & TREAT     Frequency: Per Therapist Discretion     Number of Occurrences: 1 Occurrences     Scheduling Instructions:               PT    PT EVALUATE AND TREAT     Frequency: Per Therapist Discretion     Number of Occurrences: 1 Occurrences     Order Comments: If patient's O2 level drops, PT may increase O2 until sats are > 93%.       Scheduling Instructions:               Respiratory Care    AIRWAY CLEARANCE - METANEB/VOLARA THERAPY QID     Frequency: QID     Number of Occurrences: Until Specified    NONINVASIVE POSITIVE PRESSURE VENTILATION PROTOCOL (RT TO DETERMINE SETTINGS)     Frequency: Q4H     Number of Occurrences: Until Specified   IV    INSERT & MAINTAIN PERIPHERAL IV ACCESS     Frequency: UNTIL DISCONTINUED     Number of Occurrences: Until Specified   Medications    acetylcysteine  (MUCOMYST ) 20% nebulized solution     Frequency: 3x/day     Dose: 4 mL     Route: Nebulization    albuterol  (PROVENTIL ) 2.5 mg / 3 mL (0.083%) neb solution     Frequency: Q4H PRN     Dose: 2.5 mg     Route: Nebulization    ALPRAZolam  (XANAX ) tablet     Frequency: Q12H PRN     Dose: 0.5 mg     Route: Oral    amitriptyline  (ELAVIL ) tablet     Frequency: HS PRN     Dose: 25 mg     Route: Oral    ARIPiprazole  (ABILIFY ) tablet     Frequency: Daily     Dose: 30  mg     Route: Oral    aspirin  chewable tablet 81 mg     Frequency: Daily     Dose: 81 mg     Route: Oral    atorvastatin  (LIPITOR) tablet     Frequency: QPM     Dose: 80 mg     Route: Oral    budesonide  (PULMICORT  RESPULES) 0.5 mg/2 mL nebulizer suspension     Frequency: 2x/day     Dose: 1 mg     Route: Nebulization    busPIRone  (BUSPAR ) tablet     Frequency: 2x/day     Dose: 5 mg     Route: Oral    cyclobenzaprine  (FLEXERIL ) tablet     Frequency: 3x/day PRN     Dose: 10 mg  Route: Oral    D5W 1,000 mL with sodium bicarbonate  150 mEq infusion     Frequency: Continuous     Route: Intravenous    docusate sodium  (COLACE) capsule     Frequency: 2x/day     Dose: 100 mg     Route: Oral    enoxaparin  PF (LOVENOX ) 40 mg/0.4 mL SubQ injection     Frequency: Daily     Dose: 40 mg     Route: Subcutaneous    FLUoxetine  (PROzac ) capsule     Frequency: Daily     Dose: 20 mg     Route: Oral    ipratropium-albuterol  0.5 mg-3 mg(2.5 mg base)/3 mL Solution for Nebulization     Frequency: 4x/day     Dose: 3 mL     Route: Nebulization    levoFLOXacin  (LEVAQUIN ) tablet     Frequency: Q24H     Dose: 750 mg     Route: Oral    methylPREDNISolone  sod succ (SOLU-medrol ) 40 mg/mL injection     Frequency: Q8H     Dose: 40 mg     Route: Intravenous    ondansetron  (ZOFRAN ) 2 mg/mL injection     Frequency: Q8H PRN     Dose: 4 mg     Route: Intravenous    pantoprazole  (PROTONIX ) delayed release tablet     Frequency: Daily     Dose: 40 mg     Route: Oral    perflutren  lipid microspheres (DEFINITY ) 1.3 mL in NS 10 mL (tot vol) injection     Frequency: Cardiology Once PRN     Dose: 2 mL     Route: Intravenous    rOPINIRole  (REQUIP ) tablet     Frequency: NIGHTLY     Dose: 0.25 mg     Route: Oral    zinc  oxide 56.7 g, clotrimazole (LOTRIMIN) 15 g, hydrocortisone (CORTIZONE-10) 28 g compounded topical cream     Frequency: Q30 Min PRN     Route: Apply Topically        ROS:  GENERAL: The patient denies any weight change, dizziness.  SKIN: No rashes or  sores.  HEAD: No trauma, no headache. No dizziness.  EYES: No blurriness, no acute visual loss.  EARS: No hearing loss, no tinnitus.  THROAT: No bleeding gums, no sore throat. No drainage.  CARDIAC: As per HPI.  RESPIRATORY: As per HPI.  GI: No nausea, vomiting or diarrhea. No abdominal pain.  GU: No polyuria, no dysuria. No urgency.  MUSCULOSKELETAL: No muscle weakness   EXTREMITIES: No swelling noted.  NEUROLOGICAL: No numbness, tingling or tremors.  HEMATOLOGICAL: No easy bruising or bleeding.      EXAM:  Temperature: 36.6 C (97.9 F)  Heart Rate: (!) 109  BP (Non-Invasive): (!) 150/89  Respiratory Rate: 18  SpO2: 90 %  Gen:  Awake alert oriented down 3 without any obvious respiratory distress on 5 L oxygen pulse ox 95%  Head:  Normocephalic/atraumatic  Eyes:  Pupils equally round and reactive light  ENT:  Membranes moist oropharynx free erythema and exudate or thrush.  No new lesions, rashes, or ulcerations.  Neck:  Supple, with normal range motion.  No adenopathy or thyromegaly  CV:  Regular rate and rhythm without murmurs rubs or gallops. Brooke Maxwell  RESP:   Bilateral decreased air entry with scattered crackles  ABDOMEN:  Abdomen is soft, nontender, nondistended.  Bowel sounds normoactive.  NEURO:  Essentially unremarkable  EXTREMITIES:  No pitting edema cyanosis or clubbing  Studies:  I have reviewed all available studies within the electronic medical record.    Labs:      BMP:  BMP (Last 24 Hours):    Recent Results last 24 hours     02/23/24  1125 02/24/24  0516   SODIUM 129* 128*   POTASSIUM 4.0 4.1   CHLORIDE 96* 96*   CO2 24 18*   BUN 60* 67*   CREATININE 2.23* 2.42*   CALCIUM 9.1 9.3   GLUCOSENF 123* 126*       CBC Results Differential Results   Recent Labs     02/24/24  0516   WBC 26.8*   HGB 12.9   HCT 38.7   PLTCNT 565*    No results found for this or any previous visit (from the past 30 hours).     Hepatic Function:    Recent Labs     02/23/24  1404   UROBILINOGEN Normal     PT:  No results found for  this encounter  INR:   No results found for this encounter  PTT:   No results found for this encounter  Most Recent Cardiac Markers:  No results found for this encounter  Blood Gas: No results found for this encounter  Lipid Panel:  No results found for this encounter  TSH:  No results found for this encounter    Imaging Studies:    Results for orders placed or performed during the hospital encounter of 02/14/24 (from the past 72 hours)   CT CHEST WO IV CONTRAST     Status: None    Narrative    Jamiyla K Pozo    RADIOLOGIST: Camellia Sharolyn Gilles, MD    CT CHEST WO IV CONTRAST performed on 02/21/2024 6:27 PM    CLINICAL HISTORY: Suspecting underlying pneumonitis?  Has been on high-flow for the last couple of days and no luck with weaning down.  Has been on steroids.  Suspecting underlying pneumonitis?  Has been on high-flow for the last couple of days and no luck with weaning down.  Has been on steroids.    TECHNIQUE: Chest CT without contrast.      COMPARISON: 02/14/2024         FINDINGS:  Hardware:  None.    Lymph nodes:   No mediastinal, hilar, or axillary lymphadenopathy.    Heart and Vasculature:  Normal heart size.  No pericardial effusion. Atherosclerotic calcifications of the thoracic aorta.  Thoracic aorta and pulmonary arteries have normal contours; noncontrast technique limits evaluation.      Coronary Artery Calcifications: Present    Lungs and Airways:  No significant change in bilateral geographic areas of groundglass opacity.    Pleura: No pleural effusion.  No pneumothorax.    Upper Abdomen: There is worsening of a moderate degree of left hydronephrosis. The cause is not visible. There is a nodule in the left adrenal gland 2 cm, nonspecific.    Bones: Bone windows are unremarkable.        Impression    NO CHANGE IN THE GEOGRAPHIC AREAS OF GROUNDGLASS OPACITY IN THE LUNGS. COULD BE FROM ATYPICAL PNEUMONIA, NONCARDIOGENIC EDEMA OR ACUTE LUNG INJURY.    WORSENING OF MODERATE LEFT HYDRONEPHROSIS. CAUSE IS  NOT VISIBLE.          Radiologist location ID: [id number]     US  KIDNEY     Status: None    Narrative    Phyllicia K Ribeiro    RADIOLOGIST: Rankin DELENA Louder  US  KIDNEY performed on 02/23/2024 6:38 PM    CLINICAL HISTORY: AKI, assess for hydronephrosis noted on recent CT imaging.  Air    TECHNIQUE: Bilateral renal ultrasound.    COMPARISON: None.    FINDINGS:  Renal cortical thickness and echogenicity is normal bilaterally. There is a moderate degree of bilateral hydronephrosis. No shadowing renal calculi are shown. No solid contour deforming mass was shown within either kidney.    RIGHT Kidney       Size: 11.4 cm x 7.3 cm x 6.2 cm        Volume: 271 ml    LEFT Kidney       Size: 11.7 cm x 5.5 cm x 6.5 cm        Volume: 220 ml        Impression    MODERATE BILATERAL HYDRONEPHROSIS.          Radiologist location ID: [id number]     CT ABDOMEN PELVIS WO IV CONTRAST     Status: None    Narrative    Ayrianna K Kivi    RADIOLOGIST: Porter Miyamoto    CT ABDOMEN PELVIS WO IV CONTRAST performed on 02/24/2024 9:51 AM    CLINICAL HISTORY: Ultrasound shows bilateral hydronephrosis.  Not present/reported in previous CT back from August.  Now worsening kidney function.    TECHNIQUE:  Abdomen and pelvis CT without intravenous contrast.    COMPARISON:  11/17/2023. Kidney ultrasound yesterday.      FINDINGS:  Noncontrast technique limits evaluation of the abdominal and pelvic viscera. Motion degradation also limits evaluation.    Lung bases: Patchy groundglass opacity and trace consolidation in the lung bases, not significantly changed from 02/21/2024 CT chest.    Liver:   Unremarkable.    Gallbladder:   Unremarkable.    Spleen:   Unremarkable.    Pancreas:   Unremarkable.    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    Kidneys:   Bilateral mild to moderate hydroureteronephrosis nephrosis without visible obstructing stone to the level of the UVJs.    Bladder:  Markedly  distended bladder to above the level of the umbilicus.    Uterus and Adnexa:  Prior hysterectomy.  Adnexal regions are unremarkable.    Bowel:   Moderate-length segment of small bowel distention in the left upper quadrant. Limited transition point assessment due to motion but the remaining small bowel appears completely collapsed.    Appendix:  Not seen, no evidence of acute appendicitis.    Lymph nodes:  No suspicious lymph node enlargement.    Vasculature:   Mild atherosclerotic calcifications.     Peritoneum / Retroperitoneum: Trace ascites.  No free air.    Bones:   Thoracolumbar scoliosis with multilevel degenerative changes.    Abdominal wall: Prior ventral hernia repair with mesh. Scattered subcutaneous nodular densities and trace gas in the anterior abdominal wall, likely sequela of subcutaneous injection.        Impression    Exam is limited by motion.    1. Mild-moderate bilateral hydroureteronephrosis without radiopaque obstructing stone. Markedly distended urinary bladder, likely the etiology. Please correlate for urinary retention. Consider follow-up renal and bladder ultrasound after bladder emptying or decompression by Foley.    2. Few loops of distended small bowels in the left abdomen concerning for partial obstruction. Please correlate with symptoms.      Radiologist location ID: [id number]  Echo: Results for orders placed during the hospital encounter of 02/14/24    TRANSTHORACIC ECHOCARDIOGRAM - ADULT 02/15/2024  9:57 AM    Narrative  **See full report in linked PDF document**    Version: 1    +--------------------------------------+  :                                      :  :                                      :  +--------------------------------------+  [address]    Transthoracic Echocardiographic Report    ______________________________________________________________________________  Name: FREDRICA, CAPANO                            MRN: [id number]                                    Weight: 207.003 lb  Study Date: 02/15/2024, 9: 36 AM                   DOB: 1951-02-28                                 Height: 61 in  Gender: Female                                                                                     BSA: 1.92 m2  Patient Location: PRN NON INVASIVE CARD PRN  Referring Physician: CINDIE ELESA RAMAN  Ordering Physician: HYACINTH LEANDREW PARAS  Tech: RVT Beven McDowell  ______________________________________________________________________________  Reason For Study: Dyspnea    Conclusions  Indications: Shortness of breath  Normal left ventricular size.  The left ventricular ejection fraction by visual assessment is estimated to be 60%.  No segmental/regional wall motion abnormalities identified.  Left ventricular diastolic parameters are normal.  Normal right ventricular systolic function.  Right ventricular systolic pressure is normal.  Mildly dilated left atrium.  There is mild aortic stenosis.    Findings:  Procedure: Transthoracic complete echo with contrast, 2D, spectral and tissue Doppler, color flow Doppler, M-mode.  Left Ventricle: Normal left ventricular size. The left ventricular ejection fraction by visual assessment is estimated to be 60%. No segmental/regional wall motion abnormalities identified. Left  ventricular diastolic parameters are normal.  Right Ventricle: Normal right ventricular size. Normal right ventricular systolic function. Right ventricular systolic pressure is normal.  Left Atrium: Mildly dilated left atrium.  Right Atrium: The right atrium is of normal size.  Mitral valve: Mitral valve leaflets appear mildly thickened. Mild mitral annular calcification. No evidence of mitral stenosis. No evidence of mitral regurgitation present.  Tricuspid valve: The tricuspid valve is not well visualized. Trace tricuspid regurgitation present.  Aortic valve: The aortic valve is not well visualized. The aortic valve  is mildly calcified. There is mild aortic stenosis. No  significant aortic regurgitation present.  Pulmonic valve: The pulmonic valve is normal.  Atrial Septum: The interatrial septum is normal in appearance.  IVC: Normal IVC size with >50% inspiratory collapse (estimated RA pressure 3 mmHg).  Aorta: The aortic root is of normal size.  Pericardium: Normal pericardium with no pericardial effusion.    2D/ M Mode                                                                        Doppler  LVIDd: F: (3.8-5.2)/ M: (4.2-5.8)              AV Peak Vel: 167.0 cm/sec               (100-170  LVIDs: F: (2.2-3.5/ M: 2.5-4.0)                                                       (70-90)  IVSd: F: (0.6-0.9)/ M: (0.6-1.0               AV max PG: 11.1 mmHg                   (2.0-9.0)  IVSs: 1.48 cm                                                                     AV Mean PG: 6.1 mmHg                   (2.0-4.0  LVPWd: F: (0.6-0.9)/ M: (0.6-1.0)              AVA Vmax): 2.08 cm2  AVA VTI: 1.90 cm2  AV DI (VTI): 0.56  AV DI (vel): 0.61  F: (67-162)/ M: (11-775)  F: (43-95)/ M: (49-115)                 LVOT diam: 2.08 cm  LVOT Peak Vel: 102.0 cm/sec  LA dimension: 4.3 cm                      F: (2.7-3.8)/ M: (3.0-4.0)  LAV (MOD-bp): 44.2 ml                    F: (1.5-2.3)/ M: (1.5-2.3)              LVOT Peak PG: 4.2 mmHg  F: (8-24)/ M: (11-31)                   LVOT Mean PG: 2.47 mmHg  F: (1.5-2.3)/ M: (1.5-2.3)              LVOT VTI: 20.0 cm  F: (8-24)/ M: (11-31)  F: (15-27)/ M: (18-32)  AV VTI: 35.9 cm  RVd_basal: 3.0 cm  F: (8-20)/ M: (10-24)  F: (4.5-11)/ M: (5-12.6)                AV VR: 0.61  F: (1.6-6.4)/ M: (2.0-7.4)  42-56                                   SV(LVOT): 68.1 ml  F: (32-74)/ M: (36-87)                   SI(LVOT): 35.5 ml/m2  F: (8-36)/ M: (10-44)  TAPSE: 2.15 cm                            20.5-27.5  F: (46-106)/ M: (62-150)  F: (14-42)/ M: (21-61)                  MV E Peak Vel: 63.4 cm/sec  MV A Peak Vel: 90.8 cm/sec  EDV (MOD-bp): 72.8 ml                                                             MV Decel time: 0.29 sec  ESV (MOD-bp): 25.6 ml  EF (MOD-bp): 64.8 %                      F: (54-74)/ M: (52-72)  EDV(MOD-sp4): 71.0 ml                                                            Lat Peak E' Vel: 7.1 cm/sec  EDV(MOD-sp2): 75.2 ml                                                            Lat E/E': 9.0  ESV(MOD-sp2): 27.2 ml                                                            Med Peak E' Vel: 5.8 cm/sec  ESV(MOD-sp4): 23.2 ml                                                            Med E/E': 11.0  MV V max: 93.7 cm/sec  MV Peak Vel: 65.1 cm/sec  AoR Diam: 2.7 cm                         F:: (  2.7-3.3)/ M: (3.1-3.7)  F: (2.3-2.9)/ M: (2.6-3.2)              MV VTI: 22.5 cm  Asc Ao Diam: 3.5 cm                       F: (2.3-2.9)/ M: (2.6-3.2)              MVA (VTI): 3.0 cm2  MV V max: 93.7 cm/sec  MR Peak Vel: 136.4 cm/sec  MR Peak PG: 7.5 mmHg  PV mean PG: 3.2 mmHg  PV Peak Vel: 115.4 cm/sec  PV Mean Vel: 81.9 cm/sec  PV VTI: 26.3 cm  TV S' Vel: 16.1 cm/sec  TR Vmax: 148.0 cm/sec  TR Peak PG: 8.8 mmHg  RAP systole: 6.0 mmHg  RVSP: 14.8 mmHg  TV Peak Vel: 116.2 cm/sec  TV V mean: 72.4 cm/sec  TV Peak PG: 5.5 mmHg  TV Mean PG: 2.49 mmHg  TV VTI: 28.9 cm    ______________________________________________________________________________  Electronically signed by: MD Emery King Rana 02/15/2024, 3: 18 PM             DNR Status:  FULL CODE: ATTEMPT RESUSCITATION/CPR    Assessment:   Active Hospital Problems    Diagnosis    Primary Problem: NSTEMI (non-ST elevated myocardial infarction) (CMS HCC)    Acute kidney injury (CMS HCC)    Acute on chronic heart failure with preserved ejection fraction (HFpEF)    Hypoxia    Elevated troponin    CAP (community acquired pneumonia)    Anxiety and depression    Acute respiratory failure    Hypomagnesemia    D-dimer, elevated       Assessment and Recommendations:  Bilateral ground-glass opacity, could be secondary  to underlying hypersensitivity pneumonitis, possible underlying asthma   IV antibiotics   IV steroid   Check for immunoglobulin deficiency   Currently getting IV hydration for renal insufficiency, please watch for the fluid overload   Echocardiogram showed ejection fraction of 60%   Repeat CAT scan of the chest in 6 weeks, very very important , if no significant improvement patient will require bronchoscopy  PFT as an outpatient   Follow up with me in about couple of weeks after discharge from the hospital   Repeat chest x-ray in couple of days     Thank you for allowing me to participate in this patient's care, I will follow along with you during this hospital course, If you have  any questions please do not hesitate to contact me any time.  On the day of the encounter, a total of  55 minutes was spent on this patient encounter including review of historical information, examination, documentation and post-visit activities, reviewing radiological studies and discussion with nursing staff         Diannah Blanch MD,FCCP,FASM  Pulmonary and sleep medicine    This note has been created with voice recognition software.  Please excuse any errors in transcription.  Occasional wrong word or sound alike substitutions may have occurred due to the inherent limitations of voice recognition software.  Please read the chart carefully and recognize using context with the substitutions may have occurred.  If you find any mistake or needs clarification please contact me any time           [1]   Allergies  Allergen Reactions    Cefaclor Anaphylaxis    Citalopram  Mental Status Effect and Itching   [2]  Social History  Tobacco Use    Smoking status: Never    Smokeless tobacco: Never   Vaping Use    Vaping status: Never Used   Substance Use Topics    Alcohol  use: Never    Drug use: Never

## 2024-02-24 NOTE — Respiratory Therapy (Signed)
 02/24/24 0033   Non-Invasive Ventilation Assessment   Start Time 0033   Orders Updated in EMR Yes   $ NIV NIV - subsequent   NIV Status Patient Refused    $ O2 Delivery NC   Flow (L/min) (Oxygen Therapy) 4     RT encouraged pt to wear BIPAP. Educated pt on importance of compliance with NIV. Pt stated that she does not want to wear the BIPAP.

## 2024-02-24 NOTE — PT Treatment (Signed)
 [facility]  [address]  [place], [address]  [phone]  (Fax) [phone]  Rehabilitation Department  Physical Therapy Daily Inpatient Note    Date: 02/24/2024  Patient's Name: Brooke Maxwell  Date of Birth: 11-Nov-1950  Height: Height: 154.9 cm (5' 1)  Weight: Weight: 94.2 kg (207 lb 9.6 oz)      Plan: Will continue under current POC.      Discharge Disposition: placement for care, other (see comments)        Subjective/Objective/Assessment:  Flowsheet    02/24/24 1018   Rehab Session   Document Type therapy progress note (daily note)   PT Visit Date 02/24/24   Total PT Minutes: 19   Patient Effort good   General Information   Patient Profile Reviewed yes   Medical Lines PIV Line;Telemetry   Respiratory Status nasal cannula   Existing Precautions/Restrictions fall precautions;full code   Pre Treatment Status   Pre Treatment Patient Status Patient supine in bed;Call light within reach;Patient safety alarm activated;Nurse approved session   Support Present Pre Treatment  None   Cognition   Behavior/Mood Observations behavior appropriate to situation, WNL/WFL   Vital Signs   Pre-Treatment Heart Rate (beats/min) 113   Post-treatment Heart Rate (beats/min) 120   Pre-Treatment Resp Rate (breaths/min) 29   Post-treatment Resp Rate (breaths/min) 20   Pre SpO2 (%) 90   O2 Delivery Pre Treatment supplemental O2   Post SpO2 (%) 87   O2 Delivery Post Treatment supplemental O2   Pain Assessment   Pretreatment Pain Rating 5/10   Posttreatment Pain Rating 5/10   Pre/Posttreatment Pain Comment Stomach    Bed Mobility   Supine-Sit Independence moderate assist (50% patient effort)   Balance   Sitting Balance: Static good balance   Sitting, Dynamic (Balance) good balance   Therapeutic Exercise/Activity   Comment 10 reps of long arc quads.   Post Treatment Status   Post Treatment Patient Status Patient sitting on edge of bed;Call light within reach;Patient safety alarm activated    Support Present Post Treatment  Other (See comments)  (RT)   Physical Therapy Clinical Impression   Assessment Patient requires mod assist from supine to sitting edge of bed.  She performs a long arc quads in sitting. Sats are down to 81%, it requires 6 minutes to get SATS up to 89%. Doctor Lendia in to see patient and she is able to talk with him but SATS continue to drop to around 86% with  conversation and edge of bed sitting.  Patient agrees to get to a chair but nurse reports she needs to bladder scan her first and they will get her to a chair when they are done with procedure. RT is in room as well to begin a treatment. Bed check activated.   Anticipated Discharge Disposition placement for care;other (see comments)               Goals:                set up required, modified independence  walker, rolling  150  standing pause break only         sit-to-stand/stand-to-sit, bed-to-chair/chair-to-bed  modified independence, set up required  least restrictive assistive device                     Intervention minutes: THERAPEUTIC ACTIVITY 19 minutes    THERAPIST  Kolson Chovanec, PTA  02/24/2024, 12:07

## 2024-02-24 NOTE — Progress Notes (Signed)
 [facility]  Nephrology Consult Progress Note           Brooke Maxwell, Brooke Maxwell  Date of Admission:  02/14/2024  Date of Birth:  [birth year]  Date of Service:  02/24/2024    Hospital Day:  LOS: 10 days       HPI/Subjective:   Brooke Maxwell is a 73 y.o. female with a acute renal failure  Patient is feeling okay today.  No shortness of breath.      Review of Systems  Systematic review of 12 organ systems was negative except what mentioned in in the HPI.  Vital Signs:  Temp (24hrs) Max:36.8 C (98.2 F)      Temperature: 36.8 C (98.2 F)  BP (Non-Invasive): 129/78  MAP (Non-Invasive): 91 mmHG  Heart Rate: (!) 103  Respiratory Rate: 18  SpO2: 92 %  I/O:  I/O last 24 hours:    Intake/Output Summary (Last 24 hours) at 02/24/2024 1554  Last data filed at 02/24/2024 1221  Gross per 24 hour   Intake 240 ml   Output 3000 ml   Net -2760 ml     Examination  Patient is alert awake and oriented not in acute distress.  Normal mood and affect.  Cardiovascular system: Regular rate and rhythm no murmurs rubs or gallops. No chest wall tenderness  Lungs: Clear to auscultation bilaterally no wheezing no rhonchi.  Abdomen soft nontender nondistended.  Extremities no edema  Neuro exam: EOMI, normal speech  Current Medications:  acetylcysteine  (MUCOMYST ) 20% nebulized solution, 4 mL, Nebulization, 3x/day  albuterol  (PROVENTIL ) 2.5 mg / 3 mL (0.083%) neb solution, 2.5 mg, Nebulization, Q4H PRN  alcohol  62 % (NOZIN NASAL SANITIZER) nasal swab packet, 1 Each, Each Nostril, 2x/day  ALPRAZolam  (XANAX ) tablet, 0.5 mg, Oral, Q12H PRN  amitriptyline  (ELAVIL ) tablet, 25 mg, Oral, HS PRN  ARIPiprazole  (ABILIFY ) tablet, 30 mg, Oral, Daily  aspirin  chewable tablet 81 mg, 81 mg, Oral, Daily  atorvastatin  (LIPITOR) tablet, 80 mg, Oral, QPM  budesonide  (PULMICORT  RESPULES) 0.5 mg/2 mL nebulizer suspension, 1 mg, Nebulization, 2x/day  busPIRone  (BUSPAR ) tablet, 5 mg, Oral, 2x/day  cyclobenzaprine  (FLEXERIL ) tablet, 10 mg, Oral,  3x/day PRN  D5W 1,000 mL with sodium bicarbonate  150 mEq infusion, , Intravenous, Continuous  docusate sodium  (COLACE) capsule, 100 mg, Oral, 2x/day  enoxaparin  PF (LOVENOX ) 40 mg/0.4 mL SubQ injection, 40 mg, Subcutaneous, Daily  FLUoxetine  (PROzac ) capsule, 20 mg, Oral, Daily  ipratropium-albuterol  0.5 mg-3 mg(2.5 mg base)/3 mL Solution for Nebulization, 3 mL, Nebulization, 4x/day  methylPREDNISolone  sod succ (SOLU-medrol ) 40 mg/mL injection, 40 mg, Intravenous, Q8H  ondansetron  (ZOFRAN ) 2 mg/mL injection, 4 mg, Intravenous, Q8H PRN  pantoprazole  (PROTONIX ) delayed release tablet, 40 mg, Oral, Daily  perflutren  lipid microspheres (DEFINITY ) 1.3 mL in NS 10 mL (tot vol) injection, 2 mL, Intravenous, Cardiology Once PRN  rOPINIRole  (REQUIP ) tablet, 0.25 mg, Oral, NIGHTLY  zinc  oxide 56.7 g, clotrimazole (LOTRIMIN) 15 g, hydrocortisone (CORTIZONE-10) 28 g compounded topical cream, , Apply Topically, Q30 Min PRN      Labs:  BMP:   128* (11/24 0516) 96* (11/24 0516) 67* (11/24 9483)    /     126* (11/24 9483)   4.1 (11/24 0516) 18* (11/24 0516) 2.42* (11/24 0516) \             CBC:     26.8* (11/24 0516) \   12.9 (11/24 9483) /   565* (11/24 9483)      / 38.7 (11/24 0516) \  Microbiology:  No results found for any visits on 02/14/24 (from the past 96 hours).  Imaging:   CT ABDOMEN PELVIS WO IV CONTRAST  Narrative: Brooke Maxwell    RADIOLOGIST: Porter Miyamoto    CT ABDOMEN PELVIS WO IV CONTRAST performed on 02/24/2024 9:51 AM    CLINICAL HISTORY: Ultrasound shows bilateral hydronephrosis.  Not present/reported in previous CT back from August.  Now worsening kidney function.    TECHNIQUE:  Abdomen and pelvis CT without intravenous contrast.    COMPARISON:  11/17/2023. Kidney ultrasound yesterday.    FINDINGS:  Noncontrast technique limits evaluation of the abdominal and pelvic viscera. Motion degradation also limits evaluation.    Lung bases: Patchy groundglass opacity and trace consolidation in the lung bases, not  significantly changed from 02/21/2024 CT chest.    Liver:   Unremarkable.    Gallbladder:   Unremarkable.    Spleen:   Unremarkable.    Pancreas:   Unremarkable.    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    Kidneys:   Bilateral mild to moderate hydroureteronephrosis nephrosis without visible obstructing stone to the level of the UVJs.    Bladder:  Markedly distended bladder to above the level of the umbilicus.    Uterus and Adnexa:  Prior hysterectomy.  Adnexal regions are unremarkable.    Bowel:   Moderate-length segment of small bowel distention in the left upper quadrant. Limited transition point assessment due to motion but the remaining small bowel appears completely collapsed.    Appendix:  Not seen, no evidence of acute appendicitis.    Lymph nodes:  No suspicious lymph node enlargement.    Vasculature:   Mild atherosclerotic calcifications.     Peritoneum / Retroperitoneum: Trace ascites.  No free air.    Bones:   Thoracolumbar scoliosis with multilevel degenerative changes.    Abdominal wall: Prior ventral hernia repair with mesh. Scattered subcutaneous nodular densities and trace gas in the anterior abdominal wall, likely sequela of subcutaneous injection.  Impression: Exam is limited by motion.    1. Mild-moderate bilateral hydroureteronephrosis without radiopaque obstructing stone. Markedly distended urinary bladder, likely the etiology. Please correlate for urinary retention. Consider follow-up renal and bladder ultrasound after bladder emptying or decompression by Foley.    2. Few loops of distended small bowels in the left abdomen concerning for partial obstruction. Please correlate with symptoms.    Radiologist location ID: [id number]    Assessment/ Plan:   Active Hospital Problems   (*Primary Problem)    Diagnosis    *NSTEMI (non-ST elevated myocardial infarction) (CMS HCC)    Acute kidney injury (CMS HCC)    Acute on chronic heart  failure with preserved ejection fraction (HFpEF)    Hypoxia    Elevated troponin    CAP (community acquired pneumonia)    Anxiety and depression    Acute respiratory failure    Hypomagnesemia    D-dimer, elevated     Acute renal failure  -baseline serum creatinine around 1  -current serum creatinine 2.42* (11/24 0516) condition is worse  -prerenal, over-diuresis  -continue to hold diuretics  -we will give albumin   -on bicarb drip at 50 cc an hour    Acidosis   On bicarb drip.    Electrolytes   Acceptable    Dominique Hey, MD, FASN, 02/24/2024, 15:54

## 2024-02-24 NOTE — Nurses Notes (Signed)
 Called [facility] with update on patient. And that transport is here to transfer patient.

## 2024-02-25 LAB — IMMUNOGLOBULIN G (IGG), SERUM: IMMUNOGLOBULIN G (IGG): 1101 mg/dL (ref 610–1616)

## 2024-02-25 NOTE — Care Management Notes (Signed)
 Referral Information  ++++++ Placed Provider #1 ++++++  Case Manager: Glade Molt  Provider Type: [facility]  Provider Name: [facility]  Address:  [address] [place]  [place], [place] [address]  Contact: Elsie Dama    Phone: [phone] x  Fax:   Fax: [phone]

## 2024-02-26 LAB — MYELOPEROXIDASE ANTIBODIES, IGG, SERUM
MYELOPEROXIDASE ANTIBODIES IGG QUALITATIVE: NEGATIVE
MYELOPEROXIDASE ANTIBODIES IGG QUANTITATIVE: <0.2 [AU]/ml (ref <1.0)

## 2024-02-26 LAB — IGE, TOTAL IMMUNOGLOBULIN E: IgE: 6.08 kU/L

## 2024-02-26 LAB — PROTEINASE 3 ANTIBODIES, IGG, SERUM
PROTEINASE ANTIBODIES IGG, QUALITATIVE: NEGATIVE
PROTEINASE ANTIBODIES IGG, QUANTITATIVE: <0.2 [AU]/ml (ref <1.0)

## 2024-02-27 LAB — PARAINFLUENZA VIRUS (TYPES 1 TO 4) RNA, QL RT-PCR
PARAINFLUENZA 1 RNA: NOT DETECTED
PARAINFLUENZA 2 RNA: NOT DETECTED
PARAINFLUENZA 3 RNA: NOT DETECTED
PARAINFLUENZA 4 RNA: NOT DETECTED

## 2024-03-01 LAB — ALPHA-1-ANTITRYPSIN PHENOTYPE, SERUM

## 2024-03-23 ENCOUNTER — Other Ambulatory Visit (HOSPITAL_COMMUNITY): Admit: 2024-03-23 | Discharge: 2024-03-23 | Disposition: A | Payer: Self-pay

## 2024-03-24 ENCOUNTER — Other Ambulatory Visit (HOSPITAL_COMMUNITY): Payer: Self-pay

## 2024-03-24 LAB — COMPREHENSIVE METABOLIC PANEL, NON-FASTING
ALBUMIN/GLOBULIN RATIO: 1 (ref 0.8–1.4)
ALBUMIN: 2.9 g/dL — ABNORMAL LOW (ref 3.5–5.7)
ALKALINE PHOSPHATASE: 70 U/L (ref 34–104)
ALT (SGPT): 16 U/L (ref 7–52)
ANION GAP: 6 mmol/L (ref 4–13)
AST (SGOT): 13 U/L (ref 13–39)
BILIRUBIN TOTAL: 0.4 mg/dL (ref 0.3–1.0)
BUN/CREA RATIO: 20 (ref 6–22)
BUN: 10 mg/dL (ref 7–25)
CALCIUM, CORRECTED: 9.4 mg/dL (ref 8.9–10.8)
CALCIUM: 8.5 mg/dL — ABNORMAL LOW (ref 8.6–10.3)
CHLORIDE: 103 mmol/L (ref 98–107)
CO2 TOTAL: 33 mmol/L — ABNORMAL HIGH (ref 21–31)
CREATININE: 0.5 mg/dL — ABNORMAL LOW (ref 0.60–1.30)
ESTIMATED GFR: 100 mL/min/1.73mˆ2 (ref >59)
GLOBULIN: 2.8 (ref 2.0–3.5)
GLUCOSE: 63 mg/dL — ABNORMAL LOW (ref 74–109)
OSMOLALITY, CALCULATED: 280 mosm/kg (ref 270–290)
POTASSIUM: 3.1 mmol/L — ABNORMAL LOW (ref 3.5–5.1)
PROTEIN TOTAL: 5.7 g/dL — ABNORMAL LOW (ref 6.4–8.9)
SODIUM: 142 mmol/L (ref 136–145)

## 2024-03-24 LAB — CBC/DIFF - CLIENT CONSOLIDATED
BASOPHIL #: 0 x10ˆ3/uL (ref 0.00–0.10)
BASOPHIL %: 0 % (ref 0–1)
EOSINOPHIL #: 0.2 x10ˆ3/uL (ref 0.00–0.50)
EOSINOPHIL %: 3 % (ref 1–7)
HCT: 35.1 % (ref 31.2–41.9)
HGB: 11.7 g/dL (ref 10.9–14.3)
LYMPHOCYTE #: 1.7 x10ˆ3/uL (ref 1.10–3.10)
LYMPHOCYTE %: 24 % (ref 16–46)
MCH: 29.4 pg (ref 24.7–32.8)
MCHC: 33.3 g/dL (ref 32.3–35.6)
MCV: 88.5 fL (ref 75.5–95.3)
MONOCYTE #: 0.6 x10ˆ3/uL (ref 0.20–0.90)
MONOCYTE %: 9 % (ref 4–11)
MPV: 6.8 fL — ABNORMAL LOW (ref 7.9–10.8)
NEUTROPHIL #: 4.5 x10ˆ3/uL (ref 1.90–8.20)
NEUTROPHIL %: 64 % (ref 43–77)
PLATELETS: 271 x10ˆ3/uL (ref 140–440)
RBC: 3.97 x10ˆ6/uL (ref 3.63–4.92)
RDW: 18.3 % — ABNORMAL HIGH (ref 12.3–17.7)
WBC: 7 x10ˆ3/uL
WBC: 7 x10ˆ3/uL (ref 3.8–11.8)

## 2024-03-24 LAB — CBC WITH DIFF
BASOPHIL #: 0 x10ˆ3/uL (ref 0.00–0.10)
BASOPHIL %: 0 % (ref 0–1)
EOSINOPHIL #: 0.2 x10ˆ3/uL (ref 0.00–0.50)
EOSINOPHIL %: 3 % (ref 1–7)
HCT: 35.1 % (ref 31.2–41.9)
HGB: 11.7 g/dL (ref 10.9–14.3)
LYMPHOCYTE #: 1.7 x10ˆ3/uL (ref 1.10–3.10)
LYMPHOCYTE %: 24 % (ref 16–46)
MCH: 29.4 pg (ref 24.7–32.8)
MCHC: 33.3 g/dL (ref 32.3–35.6)
MCV: 88.5 fL (ref 75.5–95.3)
MONOCYTE #: 0.6 x10ˆ3/uL (ref 0.20–0.90)
MONOCYTE %: 9 % (ref 4–11)
MPV: 6.8 fL — ABNORMAL LOW (ref 7.9–10.8)
NEUTROPHIL #: 4.5 x10ˆ3/uL (ref 1.90–8.20)
NEUTROPHIL %: 64 % (ref 43–77)
PLATELETS: 271 x10ˆ3/uL (ref 140–440)
RBC: 3.97 x10ˆ6/uL (ref 3.63–4.92)
RDW: 18.3 % — ABNORMAL HIGH (ref 12.3–17.7)
WBC: 7 x10ˆ3/uL (ref 3.8–11.8)

## 2024-03-26 ENCOUNTER — Other Ambulatory Visit (HOSPITAL_COMMUNITY): Payer: Self-pay

## 2024-03-26 ENCOUNTER — Ambulatory Visit
Admission: RE | Admit: 2024-03-26 | Discharge: 2024-03-26 | Disposition: A | Discharge status: Home / Self Care | Source: Ambulatory Visit

## 2024-03-26 DIAGNOSIS — I700 Atherosclerosis of aorta: Secondary | ICD-10-CM

## 2024-03-26 DIAGNOSIS — R0902 Hypoxemia: Secondary | ICD-10-CM

## 2024-03-26 DIAGNOSIS — R590 Localized enlarged lymph nodes: Secondary | ICD-10-CM

## 2024-03-26 MED ORDER — IOHEXOL 350 MG IODINE/ML INTRAVENOUS SOLUTION
75.0000 mL | INTRAVENOUS | Status: AC
  Administered 2024-03-26: 75 mL via INTRAVENOUS

## 2024-03-27 ENCOUNTER — Inpatient Hospital Stay (HOSPITAL_COMMUNITY): Admitting: INTERNAL MEDICINE

## 2024-03-27 ENCOUNTER — Inpatient Hospital Stay (HOSPITAL_COMMUNITY)

## 2024-03-27 ENCOUNTER — Emergency Department (HOSPITAL_COMMUNITY)

## 2024-03-27 ENCOUNTER — Encounter (HOSPITAL_COMMUNITY): Payer: Self-pay

## 2024-03-27 ENCOUNTER — Other Ambulatory Visit (HOSPITAL_COMMUNITY): Payer: Self-pay

## 2024-03-27 ENCOUNTER — Other Ambulatory Visit: Payer: Self-pay

## 2024-03-27 ENCOUNTER — Inpatient Hospital Stay
Admission: EM | Admit: 2024-03-27 | Discharge: 2024-03-30 | DRG: 208 | Disposition: A | Discharge status: Other Acute Inpatient Hospital | Attending: CRITICAL CARE MEDICINE | Admitting: CRITICAL CARE MEDICINE

## 2024-03-27 DIAGNOSIS — Z8711 Personal history of peptic ulcer disease: Secondary | ICD-10-CM

## 2024-03-27 DIAGNOSIS — F411 Generalized anxiety disorder: Secondary | ICD-10-CM | POA: Diagnosis present

## 2024-03-27 DIAGNOSIS — J9622 Acute and chronic respiratory failure with hypercapnia: Secondary | ICD-10-CM | POA: Diagnosis present

## 2024-03-27 DIAGNOSIS — Z6835 Body mass index (BMI) 35.0-35.9, adult: Secondary | ICD-10-CM

## 2024-03-27 DIAGNOSIS — Z6834 Body mass index (BMI) 34.0-34.9, adult: Secondary | ICD-10-CM

## 2024-03-27 DIAGNOSIS — J479 Bronchiectasis, uncomplicated: Secondary | ICD-10-CM | POA: Diagnosis present

## 2024-03-27 DIAGNOSIS — J8489 Other specified interstitial pulmonary diseases: Secondary | ICD-10-CM

## 2024-03-27 DIAGNOSIS — Z7984 Long term (current) use of oral hypoglycemic drugs: Secondary | ICD-10-CM

## 2024-03-27 DIAGNOSIS — I10 Essential (primary) hypertension: Secondary | ICD-10-CM | POA: Diagnosis present

## 2024-03-27 DIAGNOSIS — J9691 Respiratory failure, unspecified with hypoxia: Principal | ICD-10-CM

## 2024-03-27 DIAGNOSIS — Z79899 Other long term (current) drug therapy: Secondary | ICD-10-CM

## 2024-03-27 DIAGNOSIS — J189 Pneumonia, unspecified organism: Secondary | ICD-10-CM

## 2024-03-27 DIAGNOSIS — E669 Obesity, unspecified: Secondary | ICD-10-CM | POA: Diagnosis present

## 2024-03-27 DIAGNOSIS — J984 Other disorders of lung: Secondary | ICD-10-CM | POA: Diagnosis present

## 2024-03-27 DIAGNOSIS — J9601 Acute respiratory failure with hypoxia: Secondary | ICD-10-CM

## 2024-03-27 DIAGNOSIS — R040 Epistaxis: Secondary | ICD-10-CM | POA: Diagnosis not present

## 2024-03-27 DIAGNOSIS — K567 Ileus, unspecified: Secondary | ICD-10-CM | POA: Diagnosis not present

## 2024-03-27 DIAGNOSIS — J811 Chronic pulmonary edema: Secondary | ICD-10-CM | POA: Diagnosis present

## 2024-03-27 DIAGNOSIS — Z4682 Encounter for fitting and adjustment of non-vascular catheter: Secondary | ICD-10-CM

## 2024-03-27 DIAGNOSIS — I252 Old myocardial infarction: Secondary | ICD-10-CM

## 2024-03-27 DIAGNOSIS — E873 Alkalosis: Secondary | ICD-10-CM | POA: Diagnosis present

## 2024-03-27 DIAGNOSIS — A419 Sepsis, unspecified organism: Secondary | ICD-10-CM

## 2024-03-27 DIAGNOSIS — Z4659 Encounter for fitting and adjustment of other gastrointestinal appliance and device: Secondary | ICD-10-CM

## 2024-03-27 DIAGNOSIS — R918 Other nonspecific abnormal finding of lung field: Secondary | ICD-10-CM | POA: Diagnosis present

## 2024-03-27 DIAGNOSIS — Z881 Allergy status to other antibiotic agents status: Secondary | ICD-10-CM

## 2024-03-27 DIAGNOSIS — E785 Hyperlipidemia, unspecified: Secondary | ICD-10-CM | POA: Diagnosis present

## 2024-03-27 DIAGNOSIS — Z7982 Long term (current) use of aspirin: Secondary | ICD-10-CM

## 2024-03-27 DIAGNOSIS — K3189 Other diseases of stomach and duodenum: Secondary | ICD-10-CM | POA: Diagnosis present

## 2024-03-27 DIAGNOSIS — J9621 Acute and chronic respiratory failure with hypoxia: Principal | ICD-10-CM | POA: Diagnosis present

## 2024-03-27 DIAGNOSIS — F319 Bipolar disorder, unspecified: Secondary | ICD-10-CM | POA: Diagnosis present

## 2024-03-27 DIAGNOSIS — J849 Interstitial pulmonary disease, unspecified: Secondary | ICD-10-CM | POA: Diagnosis present

## 2024-03-27 LAB — BASIC METABOLIC PANEL
ANION GAP: 11 mmol/L (ref 4–13)
ANION GAP: 6 mmol/L (ref 4–13)
BUN/CREA RATIO: 14 (ref 6–22)
BUN/CREA RATIO: 23 — ABNORMAL HIGH (ref 6–22)
BUN: 7 mg/dL (ref 7–25)
BUN: 11 mg/dL (ref 7–25)
CALCIUM: 8.8 mg/dL (ref 8.6–10.3)
CALCIUM: 8.5 mg/dL — ABNORMAL LOW (ref 8.6–10.3)
CHLORIDE: 98 mmol/L (ref 98–107)
CHLORIDE: 98 mmol/L (ref 98–107)
CO2 TOTAL: 28 mmol/L (ref 21–31)
CO2 TOTAL: 32 mmol/L — ABNORMAL HIGH (ref 21–31)
CREATININE: 0.51 mg/dL — ABNORMAL LOW (ref 0.60–1.30)
CREATININE: 0.47 mg/dL — ABNORMAL LOW (ref 0.60–1.30)
ESTIMATED GFR: 100 mL/min/1.73mˆ2 (ref >59)
ESTIMATED GFR: 102 mL/min/1.73mˆ2 (ref >59)
GLUCOSE: 121 mg/dL — ABNORMAL HIGH (ref 74–109)
GLUCOSE: 152 mg/dL — ABNORMAL HIGH (ref 74–109)
OSMOLALITY, CALCULATED: 273 mosm/kg (ref 270–290)
OSMOLALITY, CALCULATED: 274 mosm/kg (ref 270–290)
POTASSIUM: 3.5 mmol/L (ref 3.5–5.1)
POTASSIUM: 4.1 mmol/L (ref 3.5–5.1)
SODIUM: 137 mmol/L (ref 136–145)
SODIUM: 136 mmol/L (ref 136–145)

## 2024-03-27 LAB — BLOOD GAS W/ LYTES, LACTATE REFLEX
%FIO2 (ARTERIAL): 60 %
%FIO2 (ARTERIAL): 60 %
BASE EXCESS (ARTERIAL): 7.2 mmol/L — ABNORMAL HIGH (ref 0.0–3.0)
BASE EXCESS (ARTERIAL): 8 mmol/L — ABNORMAL HIGH (ref 0.0–3.0)
BICARBONATE (ARTERIAL): 30.5 mmol/L — ABNORMAL HIGH (ref 21.0–28.0)
BICARBONATE (ARTERIAL): 31.1 mmol/L — ABNORMAL HIGH (ref 21.0–28.0)
CHLORIDE: 98 mmol/L (ref 98–107)
CHLORIDE: 98 mmol/L (ref 98–107)
GLUCOSE: 146 mg/dL — ABNORMAL HIGH (ref 65–125)
GLUCOSE: 143 mg/dL — ABNORMAL HIGH (ref 65–125)
IONIZED CALCIUM: 1.16 mmol/L (ref 1.15–1.33)
IONIZED CALCIUM: 1.16 mmol/L (ref 1.15–1.33)
LACTATE: 1.6 mmol/L (ref <=1.9)
LACTATE: 1.1 mmol/L (ref <=1.9)
O2 SATURATION (ARTERIAL): 95.7 % (ref 94.0–98.0)
O2 SATURATION (ARTERIAL): 94.9 % (ref 94.0–98.0)
PAO2/FIO2 RATIO: 130
PAO2/FIO2 RATIO: 117
PCO2 (ARTERIAL): 51 mmHg — ABNORMAL HIGH (ref 35–45)
PCO2 (ARTERIAL): 45 mmHg (ref 35–45)
PH (ARTERIAL): 7.42 (ref 7.35–7.45)
PH (ARTERIAL): 7.47 — ABNORMAL HIGH (ref 7.35–7.45)
PO2 (ARTERIAL): 78 mmHg — ABNORMAL LOW (ref 83–108)
PO2 (ARTERIAL): 70 mmHg — ABNORMAL LOW (ref 83–108)
SODIUM: 133 mmol/L — ABNORMAL LOW (ref 136–145)
SODIUM: 133 mmol/L — ABNORMAL LOW (ref 136–145)
WHOLE BLOOD POTASSIUM: 4.2 mmol/L (ref 3.5–5.1)
WHOLE BLOOD POTASSIUM: 4.1 mmol/L (ref 3.5–5.1)

## 2024-03-27 LAB — COMPREHENSIVE METABOLIC PANEL, NON-FASTING
ALBUMIN/GLOBULIN RATIO: 1.1 (ref 0.8–1.4)
ALBUMIN: 3 g/dL — ABNORMAL LOW (ref 3.5–5.7)
ALKALINE PHOSPHATASE: 76 U/L (ref 34–104)
ALT (SGPT): 16 U/L (ref 7–52)
ANION GAP: 8 mmol/L (ref 4–13)
AST (SGOT): 17 U/L (ref 13–39)
BILIRUBIN TOTAL: 0.7 mg/dL (ref 0.3–1.0)
BUN/CREA RATIO: 15 (ref 6–22)
BUN: 6 mg/dL — ABNORMAL LOW (ref 7–25)
CALCIUM, CORRECTED: 9.2 mg/dL (ref 8.9–10.8)
CALCIUM: 8.4 mg/dL — ABNORMAL LOW (ref 8.6–10.3)
CHLORIDE: 99 mmol/L (ref 98–107)
CO2 TOTAL: 31 mmol/L (ref 21–31)
CREATININE: 0.4 mg/dL — ABNORMAL LOW (ref 0.60–1.30)
ESTIMATED GFR: 106 mL/min/1.73mˆ2 (ref >59)
GLOBULIN: 2.8 (ref 2.0–3.5)
GLUCOSE: 117 mg/dL — ABNORMAL HIGH (ref 74–109)
OSMOLALITY, CALCULATED: 274 mosm/kg (ref 270–290)
POTASSIUM: 3.4 mmol/L — ABNORMAL LOW (ref 3.5–5.1)
PROTEIN TOTAL: 5.8 g/dL — ABNORMAL LOW (ref 6.4–8.9)
SODIUM: 138 mmol/L (ref 136–145)

## 2024-03-27 LAB — HEPATIC FUNCTION PANEL
ALBUMIN/GLOBULIN RATIO: 1 (ref 0.8–1.4)
ALBUMIN: 3.3 g/dL — ABNORMAL LOW (ref 3.5–5.7)
ALKALINE PHOSPHATASE: 92 U/L (ref 34–104)
ALT (SGPT): 17 U/L (ref 7–52)
AST (SGOT): 22 U/L (ref 13–39)
BILIRUBIN DIRECT: 0.21 md/dL — ABNORMAL HIGH (ref 0.03–0.18)
BILIRUBIN TOTAL: 1.1 mg/dL — ABNORMAL HIGH (ref 0.3–1.0)
BILIRUBIN, INDIRECT: 0.89 mg/dL (ref <=1)
GLOBULIN: 3.3 (ref 2.0–3.5)
PROTEIN TOTAL: 6.6 g/dL (ref 6.4–8.9)

## 2024-03-27 LAB — PT/INR
INR: 1.3 — ABNORMAL HIGH (ref 0.84–1.10)
PROTHROMBIN TIME: 14.6 s — ABNORMAL HIGH (ref 9.8–12.7)

## 2024-03-27 LAB — LACTIC ACID - FIRST REFLEX
LACTIC ACID: 1.8 mmol/L (ref 0.5–2.2)
LACTIC ACID: 2.5 mmol/L — ABNORMAL HIGH (ref 0.5–2.2)
LACTIC ACID: 1.9 mmol/L (ref 0.5–2.2)

## 2024-03-27 LAB — BLOOD GAS W/ CO-OX, LYTES, LACTATE REFLEX
%FIO2 (ARTERIAL): 70 %
%FIO2 (VENOUS): 35 %
BASE EXCESS (ARTERIAL): 8.7 mmol/L — ABNORMAL HIGH (ref 0.0–3.0)
BASE EXCESS: 4.4 mmol/L — ABNORMAL HIGH (ref 0.0–3.0)
BICARBONATE (ARTERIAL): 31.7 mmol/L — ABNORMAL HIGH (ref 21.0–28.0)
BICARBONATE (VENOUS): 28.2 mmol/L (ref 22.0–29.0)
CARBOXYHEMOGLOBIN: 3.3 % — ABNORMAL HIGH (ref <=3.0)
CARBOXYHEMOGLOBIN: 1.9 % (ref <=3.0)
CHLORIDE: 98 mmol/L (ref 98–107)
CHLORIDE: 98 mmol/L (ref 98–107)
GLUCOSE: 122 mg/dL (ref 65–125)
GLUCOSE: 143 mg/dL — ABNORMAL HIGH (ref 65–125)
HEMATOCRITRT: 37 % (ref 37–50)
HEMATOCRITRT: 35 % — ABNORMAL LOW (ref 37–50)
HEMOGLOBIN: 12.2 g/dL (ref 12.0–18.0)
HEMOGLOBIN: 11.7 g/dL — ABNORMAL LOW (ref 12.0–18.0)
IONIZED CALCIUM: 1.05 mmol/L — ABNORMAL LOW (ref 1.15–1.33)
IONIZED CALCIUM: 1.16 mmol/L (ref 1.15–1.33)
LACTATE: 2.7 mmol/L — ABNORMAL HIGH (ref <=1.9)
LACTATE: 0.9 mmol/L (ref <=1.9)
MET-HEMOGLOBIN: 0.7 % (ref <=1.5)
MET-HEMOGLOBIN: 0.9 % (ref <=1.5)
O2 SATURATION (ARTERIAL): 97 % (ref 94.0–98.0)
O2 SATURATION (VENOUS): 95 % (ref 40.0–85.0)
O2CT: 15.7 %
O2CT: 15.7 %
OXYHEMOGLOBIN: 91.2 % (ref 40.0–80.0)
OXYHEMOGLOBIN: 95 % (ref 90.0–95.0)
PAO2/FIO2 RATIO: 117
PCO2 (ARTERIAL): 42 mmHg (ref 35–45)
PCO2 (VENOUS): 47 mmHg (ref 41–51)
PH (ARTERIAL): 7.5 — ABNORMAL HIGH (ref 7.35–7.45)
PH (VENOUS): 7.41 (ref 7.32–7.43)
PO2 (ARTERIAL): 82 mmHg — ABNORMAL LOW (ref 83–108)
PO2 (VENOUS): 75 mmHg (ref 35–50)
SODIUM: 133 mmol/L — ABNORMAL LOW (ref 136–145)
SODIUM: 133 mmol/L — ABNORMAL LOW (ref 136–145)
WHOLE BLOOD POTASSIUM: 3.5 mmol/L (ref 3.5–5.1)
WHOLE BLOOD POTASSIUM: 4 mmol/L (ref 3.5–5.1)

## 2024-03-27 LAB — EXTENDED RESPIRATORY VIRUS PANEL
ADENOVIRUS ARRAY: NOT DETECTED
BORDETELLA HOLMESII: NOT DETECTED
BORDETELLA PARAPERTUSSIS/BRONCHISEPTICA: NOT DETECTED
BORDETELLA PERTUSSIS ARRAY: NOT DETECTED
METAPNEUMOVIRUS ARRAY: NOT DETECTED
PARAINFLUENZA 1 ARRAY: NOT DETECTED
PARAINFLUENZA 2 ARRAY: NOT DETECTED
PARAINFLUENZA 3 ARRAY: NOT DETECTED
PARAINFLUENZA 4 ARRAY: NOT DETECTED
RHINOVIRUS ARRAY: NOT DETECTED

## 2024-03-27 LAB — CBC WITH DIFF
BASOPHIL #: 0 x10ˆ3/uL (ref 0.00–0.10)
BASOPHIL #: 0 x10ˆ3/uL (ref 0.00–0.10)
BASOPHIL %: 0 % (ref 0–1)
BASOPHIL %: 0 % (ref 0–1)
EOSINOPHIL #: 0.2 x10ˆ3/uL (ref 0.00–0.50)
EOSINOPHIL #: 0.1 x10ˆ3/uL (ref 0.00–0.50)
EOSINOPHIL %: 2 % (ref 1–7)
EOSINOPHIL %: 1 % (ref 1–7)
HCT: 32.4 % (ref 31.2–41.9)
HCT: 35.4 % (ref 31.2–41.9)
HGB: 10.9 g/dL (ref 10.9–14.3)
HGB: 11.6 g/dL (ref 10.9–14.3)
LYMPHOCYTE #: 1.2 x10ˆ3/uL (ref 1.10–3.10)
LYMPHOCYTE #: 0.6 x10ˆ3/uL — ABNORMAL LOW (ref 1.10–3.10)
LYMPHOCYTE %: 12 % — ABNORMAL LOW (ref 16–46)
LYMPHOCYTE %: 5 % — ABNORMAL LOW (ref 16–46)
MCH: 29.4 pg (ref 24.7–32.8)
MCH: 29 pg (ref 24.7–32.8)
MCHC: 33.7 g/dL (ref 32.3–35.6)
MCHC: 32.9 g/dL (ref 32.3–35.6)
MCV: 87.3 fL (ref 75.5–95.3)
MCV: 88.1 fL (ref 75.5–95.3)
MONOCYTE #: 0.6 x10ˆ3/uL (ref 0.20–0.90)
MONOCYTE #: 0.6 x10ˆ3/uL (ref 0.20–0.90)
MONOCYTE %: 6 % (ref 4–11)
MONOCYTE %: 4 % (ref 4–11)
MPV: 6.6 fL — ABNORMAL LOW (ref 7.9–10.8)
MPV: 6.3 fL — ABNORMAL LOW (ref 7.9–10.8)
NEUTROPHIL #: 8.7 x10ˆ3/uL — ABNORMAL HIGH (ref 1.90–8.20)
NEUTROPHIL #: 11.6 x10ˆ3/uL — ABNORMAL HIGH (ref 1.90–8.20)
NEUTROPHIL %: 81 % — ABNORMAL HIGH (ref 43–77)
NEUTROPHIL %: 90 % — ABNORMAL HIGH (ref 43–77)
PLATELETS: 267 x10ˆ3/uL (ref 140–440)
PLATELETS: 284 x10ˆ3/uL (ref 140–440)
RBC: 3.72 x10ˆ6/uL (ref 3.63–4.92)
RBC: 4.02 x10ˆ6/uL (ref 3.63–4.92)
RDW: 18.7 % — ABNORMAL HIGH (ref 12.3–17.7)
RDW: 18.4 % — ABNORMAL HIGH (ref 12.3–17.7)
WBC: 10.8 x10ˆ3/uL (ref 3.8–11.8)
WBC: 12.9 x10ˆ3/uL — ABNORMAL HIGH (ref 3.8–11.8)

## 2024-03-27 LAB — CBC
HCT: 33.1 % (ref 31.2–41.9)
HGB: 11.2 g/dL (ref 10.9–14.3)
MCH: 29.7 pg (ref 24.7–32.8)
MCHC: 33.9 g/dL (ref 32.3–35.6)
MCV: 87.5 fL (ref 75.5–95.3)
MPV: 6.6 fL — ABNORMAL LOW (ref 7.9–10.8)
PLATELETS: 284 x10ˆ3/uL (ref 140–440)
RBC: 3.78 x10ˆ6/uL (ref 3.63–4.92)
RDW: 18.6 % — ABNORMAL HIGH (ref 12.3–17.7)
WBC: 10.6 x10ˆ3/uL (ref 3.8–11.8)

## 2024-03-27 LAB — COVID-19, FLU A/B, RSV RAPID BY PCR
INFLUENZA VIRUS TYPE A: NOT DETECTED
INFLUENZA VIRUS TYPE B: NOT DETECTED
RESPIRATORY SYNCTIAL VIRUS (RSV): NOT DETECTED
SARS-CoV-2: NOT DETECTED

## 2024-03-27 LAB — THYROID STIMULATING HORMONE WITH FREE T4 REFLEX: TSH: 1.272 u[IU]/mL (ref 0.450–5.330)

## 2024-03-27 LAB — C-REACTIVE PROTEIN (CRP): C-REACTIVE PROTEIN (CRP): 22.1 mg/dL — ABNORMAL HIGH (ref 0.1–0.5)

## 2024-03-27 LAB — BLOOD GAS
%FIO2 (ARTERIAL): 60 %
BASE EXCESS (ARTERIAL): 6 mmol/L — ABNORMAL HIGH (ref 0.0–3.0)
BICARBONATE (ARTERIAL): 29.6 mmol/L — ABNORMAL HIGH (ref 21.0–28.0)
O2 SATURATION (ARTERIAL): 96 % (ref 94.0–98.0)
PAO2/FIO2 RATIO: 128
PCO2 (ARTERIAL): 43 mmHg (ref 35–45)
PH (ARTERIAL): 7.46 — ABNORMAL HIGH (ref 7.35–7.45)
PO2 (ARTERIAL): 77 mmHg — ABNORMAL LOW (ref 83–108)

## 2024-03-27 LAB — BLOOD GAS W/ LACTATE REFLEX
%FIO2 (ARTERIAL): 60 %
BASE EXCESS (ARTERIAL): 5.8 mmol/L — ABNORMAL HIGH (ref 0.0–3.0)
BICARBONATE (ARTERIAL): 29.4 mmol/L — ABNORMAL HIGH (ref 21.0–28.0)
LACTATE: 2.5 mmol/L — ABNORMAL HIGH (ref <=1.9)
O2 SATURATION (ARTERIAL): 96.6 % (ref 94.0–98.0)
PAO2/FIO2 RATIO: 133
PCO2 (ARTERIAL): 40 mmHg (ref 35–45)
PH (ARTERIAL): 7.48 — ABNORMAL HIGH (ref 7.35–7.45)
PO2 (ARTERIAL): 80 mmHg — ABNORMAL LOW (ref 83–108)

## 2024-03-27 LAB — CBC/DIFF - CLIENT CONSOLIDATED
BASOPHIL #: 0 x10ˆ3/uL (ref 0.00–0.10)
BASOPHIL %: 0 % (ref 0–1)
EOSINOPHIL #: 0.2 x10ˆ3/uL (ref 0.00–0.50)
EOSINOPHIL %: 2 % (ref 1–7)
HCT: 32.4 % (ref 31.2–41.9)
HGB: 10.9 g/dL (ref 10.9–14.3)
LYMPHOCYTE #: 1.2 x10ˆ3/uL (ref 1.10–3.10)
LYMPHOCYTE %: 12 % — ABNORMAL LOW (ref 16–46)
MCH: 29.4 pg (ref 24.7–32.8)
MCHC: 33.7 g/dL (ref 32.3–35.6)
MCV: 87.3 fL (ref 75.5–95.3)
MONOCYTE #: 0.6 x10ˆ3/uL (ref 0.20–0.90)
MONOCYTE %: 6 % (ref 4–11)
MPV: 6.6 fL — ABNORMAL LOW (ref 7.9–10.8)
NEUTROPHIL #: 8.7 x10ˆ3/uL — ABNORMAL HIGH (ref 1.90–8.20)
NEUTROPHIL %: 81 % — ABNORMAL HIGH (ref 43–77)
PLATELETS: 267 x10ˆ3/uL (ref 140–440)
RBC: 3.72 x10ˆ6/uL (ref 3.63–4.92)
RDW: 18.7 % — ABNORMAL HIGH (ref 12.3–17.7)
WBC: 10.8 x10ˆ3/uL
WBC: 10.8 x10ˆ3/uL (ref 3.8–11.8)

## 2024-03-27 LAB — LACTIC ACID LEVEL W/ REFLEX FOR LEVEL >2.0
LACTIC ACID: 2.2 mmol/L (ref 0.5–2.2)
LACTIC ACID: 2.3 mmol/L — ABNORMAL HIGH (ref 0.5–2.2)

## 2024-03-27 LAB — URINALYSIS, MACROSCOPIC
BILIRUBIN: NEGATIVE mg/dL
BLOOD: NEGATIVE mg/dL
GLUCOSE: NEGATIVE mg/dL
KETONES: NEGATIVE mg/dL
LEUKOCYTES: 500 WBCs/uL — AB
NITRITE: NEGATIVE
PH: 7 (ref 5.0–9.0)
PROTEIN: NEGATIVE mg/dL
SPECIFIC GRAVITY: 1.005 (ref 1.002–1.030)
UROBILINOGEN: NORMAL mg/dL

## 2024-03-27 LAB — URINALYSIS, MICROSCOPIC
RBCS: 1 /HPF (ref <4)
WBCS: 20 /HPF — ABNORMAL HIGH (ref <6)

## 2024-03-27 LAB — TROPONIN-I
TROPONIN I: 20 ng/L — ABNORMAL HIGH (ref <15)
TROPONIN I: 27 ng/L — ABNORMAL HIGH (ref <15)

## 2024-03-27 LAB — TYPE AND SCREEN
ABO/RH(D): O POS
ANTIBODY SCREEN: NEGATIVE

## 2024-03-27 LAB — SEDIMENTATION RATE: ERYTHROCYTE SEDIMENTATION RATE (ESR): 72 mm/h — ABNORMAL HIGH (ref <30)

## 2024-03-27 LAB — LDH: LDH: 596 U/L — ABNORMAL HIGH (ref 140–271)

## 2024-03-27 LAB — B-TYPE NATRIURETIC PEPTIDE (BNP),PLASMA: BNP: 63 pg/mL (ref 1–100)

## 2024-03-27 LAB — LACTIC ACID - SECOND REFLEX: LACTIC ACID: 1 mmol/L (ref 0.5–2.2)

## 2024-03-27 LAB — RHEUMATOID FACTOR, SERUM: RHEUMATOID FACTOR: <13 [IU]/mL (ref <=30)

## 2024-03-27 LAB — HIV 1 AND 2 RAPID SCREEN
HIV-1/2 ANTIBODY SCREEN: NONREACTIVE
HIV1-p24 ANTIGEN SCREEN: NONREACTIVE

## 2024-03-27 LAB — STREP PNEUMONIAE AND LEGIONELLA ANTIGEN, URINE
LEGIONELLA ANTIGEN: NEGATIVE
S.PNEUMONIAE ANTIGEN: NEGATIVE

## 2024-03-27 LAB — PTT (PARTIAL THROMBOPLASTIN TIME): APTT: 29.5 s (ref 25.0–38.0)

## 2024-03-27 MED ORDER — SODIUM CHLORIDE 0.9 % (FLUSH) INJECTION SYRINGE
3.0000 mL | INJECTION | Freq: Three times a day (TID) | INTRAMUSCULAR | Status: DC
  Administered 2024-03-27: 0 mL
  Administered 2024-03-27 – 2024-03-30 (×9): 3 mL

## 2024-03-27 MED ORDER — SODIUM CHLORIDE 0.9% FLUSH BAG - 250 ML: INTRAVENOUS | Status: DC | PRN

## 2024-03-27 MED ORDER — POTASSIUM CHLORIDE 20 MEQ/100ML IN STERILE WATER INTRAVENOUS PIGGYBACK
20.0000 meq | INJECTION | INTRAVENOUS | Status: AC
  Administered 2024-03-27: 0 meq via INTRAVENOUS

## 2024-03-27 MED ORDER — PANTOPRAZOLE 40 MG INTRAVENOUS SOLUTION
40.0000 mg | Freq: Every day | INTRAVENOUS | Status: DC
  Administered 2024-03-27 – 2024-03-30 (×4): 40 mg via INTRAVENOUS
  Filled 2024-03-27 (×4): qty 10

## 2024-03-27 MED ORDER — LIDOCAINE 1 %-EPINEPHRINE 1:100,000 INJECTION SOLUTION
INTRAMUSCULAR | Status: AC
  Filled 2024-03-27: qty 20

## 2024-03-27 MED ORDER — METHYLPREDNISOLONE SOD SUCC 125 MG SOLUTION FOR INJECTION WRAPPER
250.0000 mg | Freq: Two times a day (BID) | INTRAVENOUS | Status: AC
  Administered 2024-03-27 – 2024-03-30 (×6): 250 mg via INTRAVENOUS
  Filled 2024-03-27 (×6): qty 4

## 2024-03-27 MED ORDER — SODIUM CHLORIDE 0.9 % (FLUSH) INJECTION SYRINGE: 3.0000 mL | INJECTION | INTRAMUSCULAR | Status: DC | PRN

## 2024-03-27 MED ORDER — ROCURONIUM 10 MG/ML INTRAVENOUS SOLUTION
Freq: Once | INTRAVENOUS | Status: AC | PRN
  Administered 2024-03-27: 100 mg via INTRAVENOUS

## 2024-03-27 MED ORDER — ETHYL ALCOHOL 62 % TOPICAL SWAB
1.0000 | Freq: Two times a day (BID) | CUTANEOUS | Status: DC
  Administered 2024-03-27 – 2024-03-30 (×7): 1 via NASAL

## 2024-03-27 MED ORDER — LEVOFLOXACIN 750 MG/150 ML IN 5 % DEXTROSE INTRAVENOUS PIGGYBACK
INJECTION | INTRAVENOUS | Status: AC
  Filled 2024-03-27: qty 150

## 2024-03-27 MED ORDER — MIDAZOLAM 5 MG/ML INJECTION WRAPPER
INTRAMUSCULAR | Status: AC
  Administered 2024-03-27: 5 mg
  Filled 2024-03-27: qty 1

## 2024-03-27 MED ORDER — SULFAMETHOXAZOLE 800 MG-TRIMETHOPRIM 160 MG TABLET: 1.0000 | ORAL_TABLET | Freq: Two times a day (BID) | ORAL | Status: DC

## 2024-03-27 MED ORDER — DEXTROSE 5% IN WATER (D5W) FLUSH BAG - 250 ML: INTRAVENOUS | Status: DC | PRN

## 2024-03-27 MED ORDER — SENNOSIDES 8.6 MG-DOCUSATE SODIUM 50 MG TABLET
1.0000 | ORAL_TABLET | Freq: Two times a day (BID) | ORAL | Status: DC
  Administered 2024-03-27 (×2): 0 via ORAL
  Administered 2024-03-28 – 2024-03-30 (×5): 1 via ORAL
  Filled 2024-03-27 (×6): qty 1

## 2024-03-27 MED ORDER — SILVER NITRATE APPLICATORS 75 %-25 % TOPICAL STICK
CUTANEOUS | Status: AC
  Filled 2024-03-27: qty 2

## 2024-03-27 MED ORDER — FENTANYL (PF) 50 MCG/ML INTRAVENOUS SOLUTION
INTRAVENOUS | Status: AC
  Filled 2024-03-27: qty 50

## 2024-03-27 MED ORDER — POTASSIUM CHLORIDE 20 MEQ/100ML IN STERILE WATER INTRAVENOUS PIGGYBACK
INJECTION | INTRAVENOUS | Status: AC
  Filled 2024-03-27: qty 100

## 2024-03-27 MED ORDER — MIDAZOLAM 5 MG/ML INJECTION WRAPPER: 2.0000 mg | Freq: Once | INTRAMUSCULAR | Status: DC

## 2024-03-27 MED ORDER — FUROSEMIDE 10 MG/ML INJECTION SOLUTION
INTRAMUSCULAR | Status: AC
  Filled 2024-03-27: qty 4

## 2024-03-27 MED ORDER — POLYETHYLENE GLYCOL 3350 17 GRAM ORAL POWDER PACKET
17.0000 g | Freq: Every day | ORAL | Status: DC
  Administered 2024-03-27: 0 g via ORAL
  Administered 2024-03-28 – 2024-03-30 (×3): 17 g via ORAL
  Filled 2024-03-27 (×3): qty 1

## 2024-03-27 MED ORDER — METHYLPREDNISOLONE SOD SUCC 125 MG SOLUTION FOR INJECTION WRAPPER
125.0000 mg | INTRAVENOUS | Status: AC
  Administered 2024-03-27: 125 mg via INTRAVENOUS

## 2024-03-27 MED ORDER — FENTANYL (PF) 50 MCG/ML INJECTION SOLUTION
100.0000 ug | Freq: Once | INTRAMUSCULAR | Status: DC
  Administered 2024-03-27: 0 ug via INTRAVENOUS

## 2024-03-27 MED ORDER — LEVOFLOXACIN 750 MG/150 ML IN 5 % DEXTROSE INTRAVENOUS PIGGYBACK
750.0000 mg | INJECTION | INTRAVENOUS | Status: AC
  Administered 2024-03-27: 750 mg via INTRAVENOUS
  Administered 2024-03-27: 0 mg via INTRAVENOUS

## 2024-03-27 MED ORDER — FENTANYL (PF) 50 MCG/ML INTRAVENOUS SOLUTION
0.0000 ug/h | INTRAVENOUS | Status: DC
  Administered 2024-03-27: 100 ug/h via INTRAVENOUS
  Administered 2024-03-27 – 2024-03-28 (×2): 125 ug/h via INTRAVENOUS
  Administered 2024-03-28: 175 ug/h via INTRAVENOUS
  Administered 2024-03-28: 125 ug/h via INTRAVENOUS
  Administered 2024-03-28: 150 ug/h via INTRAVENOUS
  Administered 2024-03-28: 125 ug/h via INTRAVENOUS
  Administered 2024-03-28 (×2): 150 ug/h via INTRAVENOUS
  Administered 2024-03-29: 0 ug/h via INTRAVENOUS
  Administered 2024-03-29: 125 ug/h via INTRAVENOUS
  Administered 2024-03-29: 50 ug/h via INTRAVENOUS
  Administered 2024-03-29: 150 ug/h via INTRAVENOUS
  Administered 2024-03-29: 0 ug/h via INTRAVENOUS
  Administered 2024-03-29 (×2): 125 ug/h via INTRAVENOUS
  Filled 2024-03-27 (×2): qty 50

## 2024-03-27 MED ORDER — FUROSEMIDE 10 MG/ML INJECTION SOLUTION
20.0000 mg | INTRAMUSCULAR | Status: AC
  Administered 2024-03-27: 20 mg via INTRAVENOUS

## 2024-03-27 MED ORDER — MIDAZOLAM 5 MG/ML INJECTION WRAPPER
5.0000 mg | Freq: Once | INTRAMUSCULAR | Status: AC
  Administered 2024-03-27: 5 mg via INTRAVENOUS
  Filled 2024-03-27: qty 1

## 2024-03-27 MED ORDER — POTASSIUM CHLORIDE 20 MEQ/100ML IN STERILE WATER INTRAVENOUS PIGGYBACK
20.0000 meq | INJECTION | INTRAVENOUS | Status: AC
  Administered 2024-03-27: 20 meq via INTRAVENOUS
  Administered 2024-03-27: 0 meq via INTRAVENOUS

## 2024-03-27 MED ORDER — ENOXAPARIN 40 MG/0.4 ML SUBCUTANEOUS SYRINGE
40.0000 mg | INJECTION | SUBCUTANEOUS | Status: DC
  Administered 2024-03-27 – 2024-03-29 (×3): 40 mg via SUBCUTANEOUS
  Filled 2024-03-27 (×3): qty 0.4

## 2024-03-27 MED ORDER — NOREPINEPHRINE BITARTRATE 16 MG/250 ML (64 MCG/ML) IN 0.9 % NACL IV
0.0000 ug/kg/min | INTRAVENOUS | Status: DC
  Administered 2024-03-27: 0.05 ug/kg/min via INTRAVENOUS
  Administered 2024-03-27: 0.01 ug/kg/min via INTRAVENOUS
  Administered 2024-03-27: 0 ug/kg/min via INTRAVENOUS
  Administered 2024-03-27: 0.03 ug/kg/min via INTRAVENOUS
  Administered 2024-03-28 (×3): 0 ug/kg/min via INTRAVENOUS
  Administered 2024-03-28: 0.01 ug/kg/min via INTRAVENOUS

## 2024-03-27 MED ORDER — METHYLPREDNISOLONE SOD SUCC 125 MG SOLUTION FOR INJECTION WRAPPER
INTRAVENOUS | Status: AC
  Filled 2024-03-27: qty 2

## 2024-03-27 MED ORDER — LEVOFLOXACIN 750 MG/150 ML IN 5 % DEXTROSE INTRAVENOUS PIGGYBACK
750.0000 mg | INJECTION | INTRAVENOUS | Status: DC
  Administered 2024-03-28: 750 mg via INTRAVENOUS
  Administered 2024-03-28: 0 mg via INTRAVENOUS
  Administered 2024-03-29: 750 mg via INTRAVENOUS
  Administered 2024-03-29 – 2024-03-30 (×2): 0 mg via INTRAVENOUS
  Administered 2024-03-30: 750 mg via INTRAVENOUS
  Filled 2024-03-27 (×4): qty 150

## 2024-03-27 MED ORDER — DIPHTH,PERTUSSIS(ACEL),TETANUS 2.5 LF UNIT-8 MCG-5 LF/0.5ML IM SYRINGE
INJECTION | INTRAMUSCULAR | Status: AC
  Filled 2024-03-27: qty 0.5

## 2024-03-27 MED ORDER — ROCURONIUM 10 MG/ML INTRAVENOUS SOLUTION
50.0000 mg | Freq: Once | INTRAVENOUS | Status: AC
  Administered 2024-03-27: 50 mg via INTRAVENOUS
  Filled 2024-03-27: qty 10

## 2024-03-27 MED ORDER — FENTANYL (PF) 50 MCG/ML INJECTION WRAPPER
INJECTION | Freq: Once | INTRAMUSCULAR | Status: AC | PRN
  Administered 2024-03-27: 100 ug via INTRAVENOUS

## 2024-03-27 MED ORDER — NOREPINEPHRINE BITARTRATE 16 MG/250 ML (64 MCG/ML) IN 0.9 % NACL IV
INTRAVENOUS | Status: AC
  Filled 2024-03-27: qty 250

## 2024-03-27 MED ORDER — ETOMIDATE 2 MG/ML INTRAVENOUS SOLUTION
Freq: Once | INTRAVENOUS | Status: AC | PRN
  Administered 2024-03-27: 30 mg via INTRAVENOUS

## 2024-03-27 MED ORDER — VANCOMYCIN 10 GRAM INTRAVENOUS SOLUTION
20.0000 mg/kg | INTRAVENOUS | Status: DC
  Administered 2024-03-27: 0 mg via INTRAVENOUS
  Filled 2024-03-27: qty 12.5

## 2024-03-27 NOTE — Respiratory Therapy (Signed)
 03/27/24 1845   Vent Check   Equipment Hamilton C6   Mode PC   Assist Control Variable  Pressure Control   Facial Skin Integrity WNL   Inspired VT (machine) 277 mLs   Expired VT (machine) 237 mLs   Set Rate 20 Breaths Per Minute   Total Rate 20 Breaths Per Minute   Spontaneous Rate 0 Breaths Per Minute   Actual Minute Volume 4.7 Liters   FiO2 60 %   MAP 13 cmH2O   Actual PEEP 5 cmH2O   Set PEEP 5 cmH2O   PC Set 28 cmH2O   PIP 34 cmH2O   Plateau Pressure 26 cmH2O   Driving Pressure (DP) (Calculated) 21 cmH20   Driving Pressure (C6 Vent Calculated) 21 cmH20   Static Compliance (Manual Calc) 10.9   I-Time 0.95 seconds   I:E Ratio 1:2.2   Inspiratory Flow 59 L/min   Sensitivity 5   Rise Time (Sec) 70 seconds     Post bronch dr sing made changes to vent pc 28, peep 5, rate 20. Lung compliance low

## 2024-03-27 NOTE — ED Triage Notes (Signed)
 Pt sent from [facility] - satting 73 on 6L oxymask. Pt had CT angio yesterday showing ground glass opacities in lungs. Sent to [facility] from [place] for respiratory failure and NSTEMI. Pt currently satting 90 on 6L oxymask

## 2024-03-27 NOTE — Care Plan (Signed)
 Patient to CCU 2 with dx of acute on chronic respiratory failure with hypoxemia.  Patient tolerating ventilator and foley remains in place. Patients vss and no distress noted. Will continue to monitor. Patients family updated on plan of care. Education and transition readiness ongoing.  Problem: Adult Inpatient Plan of Care  Goal: Absence of Hospital-Acquired Illness or Injury  Outcome: Ongoing (see interventions/notes)  Goal: Optimal Comfort and Wellbeing  Outcome: Ongoing (see interventions/notes)  Goal: Rounds/Family Conference  Outcome: Ongoing (see interventions/notes)

## 2024-03-27 NOTE — Procedures (Signed)
 [place]  [facility]  Bronchoscopy    Date: 03/27/2024  Time: 7:07 PM  Procedure:  Bronchoscopy   Indication/Diagnosis:  Hypoxic Respiratory Failure    Description of Procedure:  After informed consent was obtained, a surgical time out was performed to confirm correct patient and the procedure. The ventilator was placed on pressure control with an FiO2 of 100%.  5cc of 1% lidocaine  was injected down the ETT.  Versed , fentanyl  and roc was administered for sedation. Under satisfactory anesthesia and sterile conditions, the patient underwent fiberoptic bronchoscopy through the endotracheal/tracheostomy tube. The adult flexible bronchoscope was inserted easily through the endotracheal tube and into the trachea. A bronchial alveolar lavage was performed in the RLL.  All airways were inspected to the level of segmental bronchi.  A minimal amount of secretions were encountered and aspirated as completely as possible.  The bronchoscope was removed at the conclusion of the examination. The patient tolerated procedure.    Findings:   - No endobronchial mass, stenosis or active bleeding observed. Focal small amount of blood was observed in trachea, likely in the setting of epistaxis during/before intubation. No hyperemia observed in b/l tracheobronchial tree.  - Serial BAL x 3 was performed, No progressive bloodier aliquotes (picture in media)  - Minimal amount of secretions in b/l lungs  - BAL sent for labs    Maryjane Stank, MD  Intensivist

## 2024-03-27 NOTE — Respiratory Therapy (Signed)
 Latest Reference Range & Units 03/27/24 12:40   %FIO2 % 60   PH 7.35 - 7.45  7.46 (H)   PCO2 35 - 45 mm/Hg 43   PO2 83 - 108 mm/Hg 77 (L)   BICARBONATE 21.0 - 28.0 mmol/L 29.6 (H)   BASE EXCESS 0.0 - 3.0 mmol/L 6.0 (H)   PAO2/FIO2 RATIO  128   O2 SATURATION (ARTERIAL) 94.0 - 98.0 % 96.0   (H): Data is abnormally high  (L): Data is abnormally low    ABG results sent to DR. Rodgers MD

## 2024-03-27 NOTE — Care Plan (Signed)
 Medical Nutrition Therapy Assessment    Reason for assessment: vent    SUBJECTIVE : Patient was admitted from [facility] rehab and intubated today.    OBJECTIVE:   PMH includes:  bipolar disorder, depression, peptic ulcer disease, partial colectomy  Current Diet Order/Nutrition Support:  No diet orders on file    Height Used for Calculations: 154.9 cm (5' 1)  Weight Used For Calculations: 93.9 kg (207 lb 0.2 oz)  BMI (kg/m2): 39.2  Ideal Body Weight (IBW) (kg): 48.15  % Ideal Body Weight: 195.03    Estimated Needs:  Energy Calorie Requirements: 1432 kcal/day (30 kcal/kg IBW)  Protein Requirements (gms/day): 57 gm/day (1.2 gm/kg)    Comments: Admission problems include respiratory failure.  Plan include bronchoscopy. Na 137, Gluc 121, GFR 100    Plan/Interventions :   Once tube feeding can be initiated, recommend using Jevity 1.5 with a goal of 45 ml/hr.  This will give 1485 kcal, 64 gm protein daily.    Nutrition Diagnosis: Inadequate energy intake related to Patient on vent as evidenced by Need for TF    Eleanor Mood, RD

## 2024-03-27 NOTE — Respiratory Therapy (Signed)
 03/27/24 1100   Non-Invasive Ventilation Assessment   Start Time 1107   Orders Updated in EMR Yes   $ NIV NIV - initial   NIV Status Placed On Bipap   Oxygen Concentration (%) 55   $ BMU Checked (Resp only) I   Oxygen Noting/Safety Checks Bag/Mask Unit at Bedside Per Dept. Policy   Circuit Initial   Proximal  Temperature 32 C (89.6 F)   H2O Bag Initial   Hepa Filter Initial   Non-Invasive Ventilation Check   Type of Mask FFM   NG/OG Placed No   Facial Skin Integrity WNL   Device Hamilton C1    Mode NIV-ST   Set Rate 18   FiO2 Set 55%   IPAP 14 cmH20   EPAP 6 cmH2O   I-Time 0.9 seconds   SpO2 90 %   Alarms   Audible Alarms Checked & Functioning   Hi Rate 45   Lo Rate 5   Hi Pressure 40 cmH2O   Lo Pressure 5 cmH2O   Low Volume 100 m L   Low Min Ventilation 2.0   Timepoint   Stop Time 1122   Check Duration 15 minutes     Pt placed on BiPAP at this time. Will monitor and adjust settings according to results of pending lab results.

## 2024-03-27 NOTE — H&P (Signed)
  [facility]    Critical Care H&P    Brooke Maxwell 73 y.o. female ED02/ED02   Date of Service: 03/27/2024    Date of Admission:  03/27/2024   PCP: No Pcp Code Status:FULL CODE: ATTEMPT RESUSCITATION/CPR       Chief Complaint:  increased shortness of breath and hypoxia    HPI:   Brooke Maxwell is a 73 year old female who presented to the ED from [facility] inpatient rehab today with worsening shortness of breath and hypoxia. She was admitted here 02/14/24-02/24/24 with shortness of breath, found to have diffuse ground glass lung opacities. She was sent to [facility] facility in [facility] 02/24/24, and discharged to [facility] inpatient rehab on 03/23/24.     She denies any lung problems prior to that admission. She has no history of cigarette smoking, active or passive. 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 She does not own birds, but she does have several cats in her home. She had chest CTA yesterday at rehab center, no evidence of PE, did show bilateral, largely peripheral GGOs. Medical history significant for bipolar I disorder, HTN, anxiety, HLD.    On presentation to ED today, she was significantly tachycardic, tachypneic, and hypoxic. WBC 12.9k. Lytes WNL. Renal function WNL (BUN 7/Cr 0.51). Glucose 121. hsTnI 20. LA 2.2. COVID/Flu/RSV negative.    She was placed on BiPAP in ED, but remained significantly dyspneic, with min volume > 20L/min, and RR 40-50. ICU team contacted for admission. Patient seen and evaluated in ED. History obtained from discussion with ED physician, review of medical record, and discussion with patient and daughter at bedside.      ED medications:   Medications Ordered/Administered in the ED   NS flush syringe (has no administration in time range)   NS flush syringe (has no administration in time range)   NS 250 mL flush bag (has no administration in time range)     And   D5W 250 mL flush bag  (has no administration in time range)   NS flush syringe (has no administration in time range)   NS flush syringe (has no administration in time range)   NS 250 mL flush bag (has no administration in time range)     And   D5W 250 mL flush bag (has no administration in time range)   vancomycin  (VANCOCIN ) 1,250 mg in NS 250 mL IVPB (has no administration in time range)   potassium chloride  20 mEq in SW 100 mL premix infusion (has no administration in time range)   alcohol  62 % (NOZIN NASAL SANITIZER) nasal swab packet (has no administration in time range)   levoFLOXacin  (LEVAQUIN ) 750 mg in D5W 150 mL premix IVPB (750 mg Intravenous New Bag/New Syringe 03/27/24 1135)   potassium chloride  20 mEq in SW 100 mL premix infusion (20 mEq Intravenous New Bag/New Syringe 03/27/24 1142)   methylPREDNISolone  sod succ (SOLU-medrol ) 125 mg/2 mL injection (125 mg Intravenous Given 03/27/24 1142)   furosemide  (LASIX ) 10 mg/mL injection (20 mg Intravenous Given 03/27/24 1142)         PMHx:    Past Medical History:   Diagnosis Date    Arthropathy     Bipolar disorder, unspecified     Depression     Kidney stone     PUD (peptic ulcer disease)     Wears dentures         PSHx:  Past Surgical History:   Procedure Laterality Date    ABDOMINAL HERNIA REPAIR      ABDOMINAL HYSTERECTOMY      ANKLE SURGERY Right     COLECTOMY PARTIAL / TOTAL      HX BREAST BIOPSY Left     HX CARPAL TUNNEL RELEASE Bilateral     HX CYST REMOVAL      Cyst removed from neck    LITHOTRIPSY            Allergies:    Allergies[1] Social History  Social History[2]    Family History  Family Medical History:    None            Home Meds:      Prior to Admission medications   Medication Sig Start Date End Date Taking? Authorizing Provider   amitriptyline  (ELAVIL ) 25 mg Oral Tablet Take 1 Tablet (25 mg total) by mouth Every night as needed for Insomnia    Provider, Historical   ARIPiprazole  (ABILIFY ) 30 mg Oral Tablet Take 1 Tablet (30 mg total) by mouth Daily 08/11/19    Provider, Historical   aspirin  81 mg Oral Tablet, Chewable Chew 1 Tablet (81 mg total) Daily 02/25/24   Lendia Riesa Skiff, MD   atorvastatin  (LIPITOR) 80 mg Oral Tablet Take 1 Tablet (80 mg total) by mouth Every evening 02/24/24   Lendia Riesa Skiff, MD   busPIRone  (BUSPAR ) 10 mg Oral Tablet Take 1 Tablet (10 mg total) by mouth Twice daily    Provider, Historical   FLUoxetine  (PROZAC ) 20 mg Oral Capsule Take 1 Capsule (20 mg total) by mouth Daily    Provider, Historical   ipratropium-albuterol  0.5 mg-3 mg(2.5 mg base)/3 mL Solution for Nebulization Take 3 mL by nebulization Four times a day 02/24/24   Lendia Riesa Skiff, MD   methylPREDNISolone  (SOLU-MEDROL ) 40 mg/mL Injection Recon Soln Infuse 1 mL (40 mg total) into a venous catheter Every 8 hours 02/24/24   Lendia Riesa Skiff, MD   ondansetron  (ZOFRAN  ODT) 4 mg Oral Tablet, Rapid Dissolve Take 1 Tablet (4 mg total) by mouth Every 8 hours as needed for Nausea/Vomiting 11/11/23   Eilene Oneil RAMAN, MD   pantoprazole  (PROTONIX ) 40 mg Oral Tablet, Delayed Release (E.C.) Take 1 Tablet (40 mg total) by mouth Daily    Provider, Historical   rOPINIRole  (REQUIP ) 0.25 mg Oral Tablet Take 1 Tablet (0.25 mg total) by mouth Every night    Provider, Historical          ROS:   General: No fever or chills. No weight changes, fatigue, weakness.   HEENT: No headaches, dizziness, changes in vision, changes in hearing, or difficulty swallowing.    Skin:  No rashes, erythema or bruises.   Cardiac: No chest pain, palpitations, or arrhythmia.    Respiratory: Reports shortness of breath. No cough or wheezing.  GI: No nausea or vomiting. No abdominal pain.   Urinary: No dysuria, hematuria, or change in frequency.    Vascular: No edema.     Musculoskeletal: No muscle weakness, pain, or decreased range of motion.   Neurologic: No loss of sensation, numbness or tingling.   Endocrine: No heat or cold intolerance or polydipsia.   Psychiatric: No insomnia, depression or anxiety.      Results for  orders placed or performed during the hospital encounter of 03/27/24 (from the past 24 hours)   ECG 12 LEAD   Result Value Ref Range    Ventricular rate 122 BPM  Atrial Rate 122 BPM    PR Interval 136 ms    QRS Duration 72 ms    QT Interval 314 ms    QTC Calculation 447 ms    Calculated P Axis 22 degrees    Calculated R Axis -22 degrees    Calculated T Axis 1 degrees   BASIC METABOLIC PANEL   Result Value Ref Range    SODIUM 137 136 - 145 mmol/L    POTASSIUM 3.5 3.5 - 5.1 mmol/L    CHLORIDE 98 98 - 107 mmol/L    CO2 TOTAL 28 21 - 31 mmol/L    ANION GAP 11 4 - 13 mmol/L    CALCIUM 8.8 8.6 - 10.3 mg/dL    GLUCOSE 878 (H) 74 - 109 mg/dL    BUN 7 7 - 25 mg/dL    CREATININE 9.48 (L) 0.60 - 1.30 mg/dL    BUN/CREA RATIO 14 6 - 22    ESTIMATED GFR 100 >59 mL/min/1.62m2    OSMOLALITY, CALCULATED 273 270 - 290 mOsm/kg   HEPATIC FUNCTION PANEL   Result Value Ref Range    ALBUMIN  3.3 (L) 3.5 - 5.7 g/dL    ALKALINE PHOSPHATASE 92 34 - 104 U/L    ALT (SGPT) 17 7 - 52 U/L    AST (SGOT) 22 13 - 39 U/L    BILIRUBIN TOTAL 1.1 (H) 0.3 - 1.0 mg/dL    BILIRUBIN, INDIRECT 0.89 <=1 mg/dL    PROTEIN TOTAL 6.6 6.4 - 8.9 g/dL    GLOBULIN 3.3 2.0 - 3.5    ALBUMIN /GLOBULIN RATIO 1.0 0.8 - 1.4    BILIRUBIN DIRECT 0.21 (H) 0.03 - 0.18 md/dL   LACTIC ACID LEVEL W/ REFLEX FOR LEVEL >2.0   Result Value Ref Range    LACTIC ACID 2.2 0.5 - 2.2 mmol/L   TROPONIN-I   Result Value Ref Range    TROPONIN I 20 (H) <15 ng/L   PT/INR   Result Value Ref Range    PROTHROMBIN TIME 14.6 (H) 9.8 - 12.7 seconds    INR 1.30 (H) 0.84 - 1.10   PTT (PARTIAL THROMBOPLASTIN TIME)   Result Value Ref Range    APTT 29.5 25.0 - 38.0 seconds   TYPE AND SCREEN   Result Value Ref Range    UNITS ORDERED NOT STATED     ABO/RH(D) O POSITIVE     ANTIBODY SCREEN NEGATIVE     SPECIMEN EXPIRATION DATE 03/30/2024,2359    BLOOD GAS W/ CO-OX, LYTES, LACTATE REFLEX Venous   Result Value Ref Range    %FIO2 (VENOUS) 35 %    PH (VENOUS) 7.41 7.32 - 7.43    PCO2 (VENOUS) 47 41 - 51 mm/Hg     PO2 (VENOUS) 75 35 - 50 mm/Hg    BICARBONATE (VENOUS) 28.2 22.0 - 29.0 mmol/L    BASE EXCESS 4.4 (H) 0.0 - 3.0 mmol/L    HEMOGLOBIN 12.2 12.0 - 18.0 g/dL    HEMATOCRITRT 37 37 - 50 %    OXYHEMOGLOBIN 91.2 40.0 - 80.0 %    CARBOXYHEMOGLOBIN 3.3 (H) <=3.0 %    MET-HEMOGLOBIN 0.7 <=1.5 %    O2CT 15.7 %    O2 SATURATION (VENOUS) 95.0 40.0 - 85.0 %    SODIUM 133 (L) 136 - 145 mmol/L    HEMOLYSIS None None, Mild    WHOLE BLOOD POTASSIUM 3.5 3.5 - 5.1 mmol/L    CHLORIDE 98 98 - 107 mmol/L    IONIZED CALCIUM 1.05 (L) 1.15 -  1.33 mmol/L    GLUCOSE 122 65 - 125 mg/dL    LACTATE 2.7 (H) <=8.0 mmol/L   CBC WITH DIFF   Result Value Ref Range    WBC 12.9 (H) 3.8 - 11.8 x103/uL    RBC 4.02 3.63 - 4.92 x106/uL    HGB 11.6 10.9 - 14.3 g/dL    HCT 64.5 68.7 - 58.0 %    MCV 88.1 75.5 - 95.3 fL    MCH 29.0 24.7 - 32.8 pg    MCHC 32.9 32.3 - 35.6 g/dL    RDW 81.5 (H) 87.6 - 17.7 %    PLATELETS 284 140 - 440 x103/uL    MPV 6.3 (L) 7.9 - 10.8 fL    NEUTROPHIL % 90 (H) 43 - 77 %    LYMPHOCYTE % 5 (L) 16 - 46 %    MONOCYTE % 4 4 - 11 %    EOSINOPHIL % 1 1 - 7 %    BASOPHIL % 0 0 - 1 %    NEUTROPHIL # 11.60 (H) 1.90 - 8.20 x103/uL    LYMPHOCYTE # 0.60 (L) 1.10 - 3.10 x103/uL    MONOCYTE # 0.60 0.20 - 0.90 x103/uL    EOSINOPHIL # 0.10 0.00 - 0.50 x103/uL    BASOPHIL # 0.00 0.00 - 0.10 x103/uL   COVID-19, FLU A/B, RSV RAPID BY PCR   Result Value Ref Range    SARS-CoV-2 Not Detected Not Detected    INFLUENZA VIRUS TYPE A Not Detected Not Detected    INFLUENZA VIRUS TYPE B Not Detected Not Detected    RESPIRATORY SYNCTIAL VIRUS (RSV) Not Detected Not Detected   URINALYSIS, MACROSCOPIC   Result Value Ref Range    COLOR Yellow Colorless, Light Yellow, Yellow    APPEARANCE Turbid (A) Clear    SPECIFIC GRAVITY 1.005 1.002 - 1.030    PH 7.0 5.0 - 9.0    LEUKOCYTES 500 (A) Negative, 100  WBCs/uL    NITRITE Negative Negative    PROTEIN Negative Negative, 10 , 20  mg/dL    GLUCOSE Negative Negative, 30  mg/dL    KETONES Negative Negative,  Trace mg/dL    BILIRUBIN Negative Negative, 0.5 mg/dL    BLOOD Negative Negative, 0.03 mg/dL    UROBILINOGEN Normal Normal mg/dL   URINALYSIS, MICROSCOPIC   Result Value Ref Range    BACTERIA Moderate (A) Negative /hpf    MUCOUS Rare Rare, Occasional, Few /hpf    RBCS 1 <4 /hpf    WBCS 20 (H) <6 /hpf    WHITE BLOOD CELL CLUMP Occasional (A) (none) /hpf   BLOOD GAS Arterial   Result Value Ref Range    %FIO2 (ARTERIAL) 60 %    PH (ARTERIAL) 7.46 (H) 7.35 - 7.45    PCO2 (ARTERIAL) 43 35 - 45 mm/Hg    PO2 (ARTERIAL) 77 (L) 83 - 108 mm/Hg    BICARBONATE (ARTERIAL) 29.6 (H) 21.0 - 28.0 mmol/L    BASE EXCESS (ARTERIAL) 6.0 (H) 0.0 - 3.0 mmol/L    PAO2/FIO2 RATIO 128     O2 SATURATION (ARTERIAL) 96.0 94.0 - 98.0 %   LACTIC ACID - FIRST REFLEX   Result Value Ref Range    LACTIC ACID 1.8 0.5 - 2.2 mmol/L   B-TYPE NATRIURETIC PEPTIDE (BNP),PLASMA   Result Value Ref Range    BNP 63 1 - 100 pg/mL   Results for orders placed or performed in visit on 03/27/24 (from the past 24 hours)  COMPREHENSIVE METABOLIC PANEL, NON-FASTING   Result Value Ref Range    SODIUM 138 136 - 145 mmol/L    POTASSIUM 3.4 (L) 3.5 - 5.1 mmol/L    CHLORIDE 99 98 - 107 mmol/L    CO2 TOTAL 31 21 - 31 mmol/L    ANION GAP 8 4 - 13 mmol/L    BUN 6 (L) 7 - 25 mg/dL    CREATININE 9.59 (L) 0.60 - 1.30 mg/dL    BUN/CREA RATIO 15 6 - 22    ESTIMATED GFR 106 >59 mL/min/1.23m2    ALBUMIN  3.0 (L) 3.5 - 5.7 g/dL    CALCIUM 8.4 (L) 8.6 - 10.3 mg/dL    GLUCOSE 882 (H) 74 - 109 mg/dL    ALKALINE PHOSPHATASE 76 34 - 104 U/L    ALT (SGPT) 16 7 - 52 U/L    AST (SGOT) 17 13 - 39 U/L    BILIRUBIN TOTAL 0.7 0.3 - 1.0 mg/dL    PROTEIN TOTAL 5.8 (L) 6.4 - 8.9 g/dL    ALBUMIN /GLOBULIN RATIO 1.1 0.8 - 1.4    OSMOLALITY, CALCULATED 274 270 - 290 mOsm/kg    CALCIUM, CORRECTED 9.2 8.9 - 10.8 mg/dL    GLOBULIN 2.8 2.0 - 3.5   CBC/DIFF - CLIENT CONSOLIDATED   Result Value Ref Range    WBC 10.8 3.8 - 11.8 x103/uL    RBC 3.72 3.63 - 4.92 x106/uL    HGB 10.9 10.9 - 14.3 g/dL    HCT 67.5  68.7 - 58.0 %    MCV 87.3 75.5 - 95.3 fL    MCH 29.4 24.7 - 32.8 pg    MCHC 33.7 32.3 - 35.6 g/dL    RDW 81.2 (H) 87.6 - 17.7 %    PLATELETS 267 140 - 440 x103/uL    MPV 6.6 (L) 7.9 - 10.8 fL    NEUTROPHIL % 81 (H) 43 - 77 %    LYMPHOCYTE % 12 (L) 16 - 46 %    MONOCYTE % 6 4 - 11 %    EOSINOPHIL % 2 1 - 7 %    BASOPHIL % 0 0 - 1 %    NEUTROPHIL # 8.70 (H) 1.90 - 8.20 x103/uL    LYMPHOCYTE # 1.20 1.10 - 3.10 x103/uL    MONOCYTE # 0.60 0.20 - 0.90 x103/uL    EOSINOPHIL # 0.20 0.00 - 0.50 x103/uL    BASOPHIL # 0.00 0.00 - 0.10 x103/uL    WBC 10.8 x103/uL   CBC WITH DIFF   Result Value Ref Range    WBC 10.8 3.8 - 11.8 x103/uL    RBC 3.72 3.63 - 4.92 x106/uL    HGB 10.9 10.9 - 14.3 g/dL    HCT 67.5 68.7 - 58.0 %    MCV 87.3 75.5 - 95.3 fL    MCH 29.4 24.7 - 32.8 pg    MCHC 33.7 32.3 - 35.6 g/dL    RDW 81.2 (H) 87.6 - 17.7 %    PLATELETS 267 140 - 440 x103/uL    MPV 6.6 (L) 7.9 - 10.8 fL    NEUTROPHIL % 81 (H) 43 - 77 %    LYMPHOCYTE % 12 (L) 16 - 46 %    MONOCYTE % 6 4 - 11 %    EOSINOPHIL % 2 1 - 7 %    BASOPHIL % 0 0 - 1 %    NEUTROPHIL # 8.70 (H) 1.90 - 8.20 x103/uL    LYMPHOCYTE # 1.20 1.10 -  3.10 x103/uL    MONOCYTE # 0.60 0.20 - 0.90 x103/uL    EOSINOPHIL # 0.20 0.00 - 0.50 x103/uL    BASOPHIL # 0.00 0.00 - 0.10 x103/uL          Physical:  Filed Vitals:    03/27/24 1309 03/27/24 1314 03/27/24 1319 03/27/24 1324   BP:  96/86 (!) 125/96 124/80   Pulse: (!) 116 (!) 114 (!) 110 96   Resp: (!) 34 (!) 54 (!) 50 14   Temp:       SpO2: (!) 87% 95% 95% 98%      General: Patient is awake and alert. Appears fatigued and dyspneic. On BiPAP.SABRA   Head: Normocephalic and atraumatic.    Eyes: Pupils equally round and react to light and accommodate. Extraocular movements intact.  Conjunctiva normal. Sclerae are normal.    Nose: Nasal passages clear. Mucosa moist.    Throat: Moist oral mucosa. No erythema or exudate of the pharynx. Clear oropharynx.    Neck: Supple. No cervical lymphadenopathy or supraclavicular nodes  detected. Trachea midline   Heart: Regular rate and rhythm. S1 & S2 present. No S3 or S4. No rubs, gallops, or murmurs appreciated.  Radial and dorsalis pedis pulses +2/4 bilaterally.  Brisk capillary refill.    Lungs: Few expiratory wheezes, scattered crackles appreciated. On BiPAP (15/5 FiO2 60%), tachypneic and dyspneic.  Abdomen: Soft, nontender, nondistended belly. Bowel sounds are present in all four quadrants. No rigidity.  No guarding.  No ascites.   Extremities: No edema, cyanosis, or clubbing. Grossly moves all extremities.    Skin: Warm and dry without lesions. No ecchymosis noted.    Neurologic: Cranial nerves II through XII are grossly intact. Sensation to light touch is intact. Strength 5/5 in upper extremities and lower extremities bilaterally.    Genitourinary:  No urinary incontinence or Foley catheter   Psychiatric: Judgment and insight are intact. Mood and affect are appropriate for the situation.       Diagnostic studies:  CT ANGIO CHEST W IV CONTRAST  Result Date: 03/26/2024  Impression No CTA evidence of pulmonary embolus. Persistent ill-defined areas of groundglass attenuation seen throughout both lungs which have increased compared to 02/21/2024. There has been interval development of associated ill-defined areas of pulmonary parenchymal infiltrate seen throughout both lungs. Enlarged right lower paratracheal lymph node which has increased in size. This is most likely reactive in etiology. Other nonacute findings as described above. Radiologist location ID: [id number]          EKG interpretation: Sinus tachycardia, rate 122, some baseline artifact, slow R wave progression, no gross ST segment elevation or depression; PR 136 ms, QRS 72 ms, QTC 447 ms      @PEVF @    Assessments:  Active Hospital Problems   (*Primary Problem)    Diagnosis    *Acute on chronic respiratory failure with hypoxemia (CMS HCC)    Ground glass opacity present on imaging of lung    Pneumonitis, interstitial    GAD  (generalized anxiety disorder)    Bipolar 1 disorder (CMS HCC)       Acute on chronic hypoxemic respiratory failure  Bilateral diffuse GGOs  Bipolar I disorder  Anxiety disorder      Admit to ICU. Patient has significant work of breathing despite seemingly adequate BiPAP support on exam in ED;intubated/placed on mechanical ventilator in ED. Will perform bronchoscopy with BAL and send full workup for hypersensitivity pneumonitis/vasculitis/PJP/S.pneumo/Legionella/MRSA. Sedation per orders. Routine ventilator orders. Place arterial line for  accurate BP monitoring and frequent ABG sampling. Overall condition is critical and prognosis is guarded. Plan of care discussed with family member.          Plan:  Patient is a 73 year old female who will be admitted for the above problems.    See plan and orders.    Code status: FULL CODE: ATTEMPT RESUSCITATION/CPR       DVT/PE Prophylaxis: LMWH  GI prophylaxis: PPI  Full code      Daily Orders (From admission, onward)      None          Anticoagulants (last 24 hours)       None             Diet: No diet orders on file    Disposition:  The patient is currently acutely ill requiring treatment in the ICU for the above diagnoses. Patient will be closely evaluated monitor and remove be adjusted accordingly.  Estimated length of stay greater than 48 hr to obtain full medical treatment.      Donnice Larve, APRN, CNP    [facility]  Department of Critical Care       [1]   Allergies  Allergen Reactions    Cefaclor Anaphylaxis    Citalopram  Mental Status Effect and Itching   [2]   Social History  Tobacco Use    Smoking status: Never    Smokeless tobacco: Never   Vaping Use    Vaping status: Never Used   Substance Use Topics    Alcohol  use: Never    Drug use: Never

## 2024-03-27 NOTE — Nurses Notes (Signed)
 Specimens from Bronchoscopy were sent to lab per order.  Levophed  has been decreased to 0.01 mcg/kg/min.  Fentanyl  currently at 125 mcg/kg/min.  Opens eyes to speech but following no commands.  Foley patent with good urinary output.  Incontinent of small stone.  Turned and cleaned.

## 2024-03-27 NOTE — Respiratory Therapy (Signed)
 03/27/24 2001   Vent Assessment   Start Time 2007   Orders Updated in EMR Yes   $ BMU Checked (Resp only) S   Head of Bed (HOB) Positioning HOB elevated   Oxygen Noting/Safety Checks Bag/Mask Unit at Bedside Per Dept. Policy   Assessment Vent Change   Reason For Change Abg   MD Notified Y   Proximal  Temperature 35 C (95 F)   H2O Bag (VENT) Checked   HEPA/Hydrophobic Filter checked   Circuit Checked   Inline Suction Catheter Checked   $ Cuff Check (Resp only) Y   Method used MOV   Cuff Inflated   SpO2 98 %   Airway   Airway Type ETT   Breath Sounds   L General Breath Sounds Diminished   R General Breath Sounds Diminished   EndoTracheal Tube 8.0 22;Right Lip   Intubation Date/Intubation Time: 03/27/24 1320   Reason For Intubation: Emergent  Tube Size: 8.0  Position: 22;Right  Landmark: Lip  Intubation Confirmation: Chest Xray;ETCO2;Auscultation   Airway Secure Bite Block;Device   Position Change Yes   ET tube measurement (cm) 22 cm   ET Tube Landmark at the Lip   Physical Assessment of ETT Performed by RT   Bilateral General Breath Sounds Diminished   Suction   Suction Method not required   Vent Check   Equipment Hamilton C6   Mode PC   Assist Control Variable  Pressure Control   Facial Skin Integrity WNL   Inspired VT (machine) 399 mLs   Expired VT (machine) 987 mLs   Set Rate 18 Breaths Per Minute   Total Rate 18 Breaths Per Minute   Actual Minute Volume 7 Liters   FiO2 70 %   MAP 13 cmH2O   Actual PEEP 5 cmH2O   Set PEEP 5 cmH2O   PC Set 28 cmH2O   PIP 34 cmH2O   Plateau Pressure 29 cmH2O   Driving Pressure (DP) (Calculated) 24 cmH20   Driving Pressure (C6 Vent Calculated) 24 cmH20   Static Compliance (Manual Calc) 15.8   I-Time 0.95 seconds   I:E Ratio 1:2.5   Inspiratory Flow 66.8 L/min   Sensitivity 5   Rise Time (Sec) 70 seconds   Vent Alarms   Audible Alarms Checked & Functioning   Hi Pressure 40 cmH2O   Lo Pressure 5 cmH2O   High Min Volume 20 Liters   Low Min Volume 2 Liters   Hi RR 40 breaths per  minute   Low RR 5 breaths per minute   High Spont VT 1000 mL   Low Spont VT 100 mL   High Set VT 1000 mL   Low Set VT 100 mL   Apnea Alarm 20 seconds   Timepoint   Stop Time 2020   Vent Check Duration 13 minutes   Vent changes made per ABG results. RT will continue to monitor.

## 2024-03-27 NOTE — Respiratory Therapy (Signed)
 Latest Reference Range & Units 03/27/24 19:59   %FIO2 % 60   PH 7.35 - 7.45  7.47 (H)   PCO2 35 - 45 mm/Hg 45   PO2 83 - 108 mm/Hg 70 (L)   BICARBONATE 21.0 - 28.0 mmol/L 31.1 (H)   BASE EXCESS 0.0 - 3.0 mmol/L 8.0 (H)   PAO2/FIO2 RATIO  117   O2 SATURATION (ARTERIAL) 94.0 - 98.0 % 94.9   SODIUM 136 - 145 mmol/L 133 (L)   LACTATE <=1.9 mmol/L 1.1   CHLORIDE 98 - 107 mmol/L 98   GLUCOSE 65 - 125 mg/dL 856 (H)   IONIZED CALCIUM 1.15 - 1.33 mmol/L 1.16   HEMOLYSIS None, Mild  None   WHOLE BLOOD K+ 3.5 - 5.1 mmol/L 4.1   (H): Data is abnormally high  (L): Data is abnormally low    ABG results given to Terri for review.

## 2024-03-27 NOTE — Care Management Notes (Signed)
 Spoke with daughter and brother of patient (now intubated and sedated), daughter will be named HCS (document scanned), encouraged daughter when patient off ventilator and clear of sedation and oriented x 3 to fill out MPOA papers and that these could be done by [facility] department here

## 2024-03-27 NOTE — Respiratory Therapy (Signed)
 Latest Reference Range & Units 03/27/24 21:30   %FIO2 % 70   PH 7.35 - 7.45  7.50 (H)   PCO2 35 - 45 mm/Hg 42   PO2 83 - 108 mm/Hg 82 (L)   BICARBONATE 21.0 - 28.0 mmol/L 31.7 (H)   BASE EXCESS 0.0 - 3.0 mmol/L 8.7 (H)   PAO2/FIO2 RATIO  117   O2CT % 15.7   O2 SATURATION (ARTERIAL) 94.0 - 98.0 % 97.0   HEMATOCRITRT 37 - 50 % 35 (L)   CARBOXYHEMOGLOBIN <=3.0 % 1.9   HEMOGLOBIN 12.0 - 18.0 g/dL 88.2 (L)   MET-HEMOGLOBIN <=1.5 % 0.9   OXYHEMOGLOBIN 90.0 - 95.0 % 95.0   SODIUM 136 - 145 mmol/L 133 (L)   LACTATE <=1.9 mmol/L 0.9   CHLORIDE 98 - 107 mmol/L 98   GLUCOSE 65 - 125 mg/dL 856 (H)   IONIZED CALCIUM 1.15 - 1.33 mmol/L 1.16   HEMOLYSIS None, Mild  None   WHOLE BLOOD K+ 3.5 - 5.1 mmol/L 4.0   (H): Data is abnormally high  (L): Data is abnormally low    ABG results given to Terri for review.

## 2024-03-27 NOTE — Respiratory Therapy (Signed)
 Latest Reference Range & Units 03/27/24 13:58   %FIO2 % 60   PH 7.35 - 7.45  7.48 (H)   PCO2 35 - 45 mm/Hg 40   PO2 83 - 108 mm/Hg 80 (L)   BICARBONATE 21.0 - 28.0 mmol/L 29.4 (H)   BASE EXCESS 0.0 - 3.0 mmol/L 5.8 (H)   PAO2/FIO2 RATIO  133   O2 SATURATION (ARTERIAL) 94.0 - 98.0 % 96.6   LACTATE <=1.9 mmol/L 2.5 (H)    Post intubation abg resulted to Dr.Leonard

## 2024-03-27 NOTE — Respiratory Therapy (Signed)
 Latest Reference Range & Units 03/27/24 17:36   %FIO2 % 60   PH 7.35 - 7.45  7.42   PCO2 35 - 45 mm/Hg 51 (H)   PO2 83 - 108 mm/Hg 78 (L)   BICARBONATE 21.0 - 28.0 mmol/L 30.5 (H)   BASE EXCESS 0.0 - 3.0 mmol/L 7.2 (H)   PAO2/FIO2 RATIO  130   O2 SATURATION (ARTERIAL) 94.0 - 98.0 % 95.7   SODIUM 136 - 145 mmol/L 133 (L)   LACTATE <=1.9 mmol/L 1.6   CHLORIDE 98 - 107 mmol/L 98   GLUCOSE 65 - 125 mg/dL 853 (H)   IONIZED CALCIUM 1.15 - 1.33 mmol/L 1.16   HEMOLYSIS None, Mild  None   WHOLE BLOOD K+ 3.5 - 5.1 mmol/L 4.2   (H): Data is abnormally high  (L): Data is abnormally low    Results given to Dr. Dennise.

## 2024-03-27 NOTE — ED Provider Notes (Signed)
 [facility]  ED Primary Provider Note  Patient Name: Brooke Maxwell  Patient Age: 73 y.o.  Date of Birth: 01/15/1951    Chief Complaint: Shortness of Breath              Historical Data   History Reviewed This Encounter: Medical History  Surgical History  Family History  Social History    ED Triage Vitals [03/27/24 1038]   BP (Non-Invasive) (!) 85/64   Heart Rate (!) 136   Respiratory Rate (!) 27   Temperature 37.7 C (99.9 F)   SpO2 90 %   Weight 93.9 kg (207 lb)   Height 1.549 m (5' 1)           History of Present Illness   had concerns including Shortness of Breath.     Brooke Maxwell is a 73 y.o. female sent from [facility] due to respiratory distress. She was just discharged from the hospital on the 22nd and has been at [facility] after being admitted on November 24th for hypoxic respiratory failure and non ST elevation myocardial infarction, it was not clear what was causing the respiratory failure they noted she had some ground-glass opacities bilaterally, there was mention of some interstitial lung disease, her echo showed an EF of 60% and she had negative CT imaging for PE.  On arrival the patient is confused and 50% on room air.        ROS:  All other review of systems negative unless otherwise stated in HPI.    Physical:    Nursing notes reviewed for what could be assessed. Past Medical, Surgical, and Social history reviewed for what has been completed.     Constitutional:  Ill-appearing  HENT:  Head atraumatic, right anterior epistaxis, moist mucous membranes  Eyes:  EOMI, pupils equal and round, conjunctiva normal.  Neck: Supple  Cardiovascular: Regular Rate and Rhythm with no murmurs rubs or gallops, no JVD, no lower extremity edema or calf tenderness  Pulmonary:  Dyspneic, no wheezing, crackles throughout  Abdominal: Soft, non-tender  MSK:  No deformities or swelling and moving all extremities  Skin: Warm, dry, dusky  Neuro:  Alert and answers questions but confused,  cranial nerves 2-12 grossly intact, no focal motor deficits    MDM:    Patient arrived hypoxic placed on BiPAP 12/6 with 60% FiO2 with some improvement, respiratory rate remains around 50 breaths per minute.  Initially hypotensive on triage but blood pressures have remained stable on repeat checks and she is tachycardic.  Chest x-ray shows diffuse pulmonary disease there appears to be an effusion on the right it is unclear if this is pneumonia versus pulmonary edema.  She was treated empirically with vancomycin , Levaquin , and 20 mg IV Lasix .  Patient developed right anterior epistaxis that was cauterized with silver  nitrate at bedside.  She has had good diuresis from this overall has improved on BiPAP but remains significantly tachypneic.  ICU Dr dennise saw and evaluated the patient recommended admission and will be bronchoscopy tomorrow.  Patient was intubated with an 8 0 ET tube in anticipation for bronch.  Admitted to ICU.    Critical Care Attestation:  As the ED physician, I have provided critical care for this patient.  This patient has high probability of imminent life or limb threatening deterioration due to acute respiratory failure.  I provided direct patient care, documentation of findings, review of medical records, discussion with patient/family, crystalloid volume resuscitation, interpretation of EKG, interpretation of  chest x-ray, interpretation of arterial blood gas, directing of titration of mechanical ventilation, directing of titration of parenteral sedation, and coordination of multidisciplinary care and frequent reassessment.  Time spent providing critical care (exclusive of any billed procedures and/or teaching): 58 minutes.          Intubation    Date/Time: 03/27/2024 2:08 PM    Performed by: Rodgers Chew, MD  Authorized by: Dennise Carmine, MD    Consent:     Consent obtained:  Emergent situation  Universal protocol:     Immediately prior to procedure, a time out was called: yes       Patient identity confirmed:  Arm band and hospital-assigned identification number  Pre-procedure details:     Pharmacologic strategy: RSI      Induction agents:  Etomidate     Paralytics:  Rocuronium   Procedure details:     Preoxygenation:  BiLevel  Successful intubation attempt details:     Intubation method:  Oral    Intubation technique: video assisted      Laryngoscope blade:  Mac 3    Bougie used: no      Grade view: IV      Tube size (mm):  8.0    Tube type:  Cuffed    Tube visualized through cords: yes    Placement assessment:     ETT at teeth/gumline (cm):  22    Tube secured with:  ETT holder    Breath sounds:  Equal and absent over the epigastrium    Placement verification: chest rise, CXR verification, equal breath sounds, numeric ETCO2 and waveform ETCO2    Post-procedure details:     Procedure completion:  Tolerated well, no immediate complications        Patient Data     Labs Ordered/Reviewed   BASIC METABOLIC PANEL - Abnormal; Notable for the following components:       Result Value    GLUCOSE 121 (*)     CREATININE 0.51 (*)     All other components within normal limits    Narrative:     Estimated Glomerular Filtration Rate (eGFR) is calculated using the CKD-EPI (2021) equation, intended for patients 39 years of age and older. If gender is not documented or unknown, there will be no eGFR calculation.     HEPATIC FUNCTION PANEL - Abnormal; Notable for the following components:    ALBUMIN  3.3 (*)     BILIRUBIN TOTAL 1.1 (*)     BILIRUBIN DIRECT 0.21 (*)     All other components within normal limits   TROPONIN-I - Abnormal; Notable for the following components:    TROPONIN I 20 (*)     All other components within normal limits   PT/INR - Abnormal; Notable for the following components:    PROTHROMBIN TIME 14.6 (*)     INR 1.30 (*)     All other components within normal limits    Narrative:     In the setting of warfarin therapy, a moderate-intensity INR goal range is 2.0 to 3.0 and a high-intensity INR  goal range is 2.5 to 3.5.    INR is ONLY validated to determine the level of anticoagulation with vitamin K antagonists (warfarin). Other factors may elevate the INR including but not limited to direct oral anticoagulants (DOACs), liver dysfunction, vitamin K deficiency, DIC, factor deficiencies, and factor inhibitors.   BLOOD GAS W/ CO-OX, LYTES, LACTATE REFLEX - Abnormal; Notable for the following components:    BASE EXCESS 4.4 (*)  CARBOXYHEMOGLOBIN 3.3 (*)     SODIUM 133 (*)     IONIZED CALCIUM 1.05 (*)     LACTATE 2.7 (*)     All other components within normal limits    Narrative:     Manufacturer does not recommend venous sample for assessment of patient oxygenation status. A reference range for pO2, Oxyhemoglobin and Oxygen Saturation is provided but abnormal results will not flag in Epic.   CBC WITH DIFF - Abnormal; Notable for the following components:    WBC 12.9 (*)     RDW 18.4 (*)     MPV 6.3 (*)     NEUTROPHIL % 90 (*)     LYMPHOCYTE % 5 (*)     NEUTROPHIL # 11.60 (*)     LYMPHOCYTE # 0.60 (*)     All other components within normal limits   URINALYSIS, MACROSCOPIC - Abnormal; Notable for the following components:    APPEARANCE Turbid (*)     LEUKOCYTES 500 (*)     All other components within normal limits   URINALYSIS, MICROSCOPIC - Abnormal; Notable for the following components:    BACTERIA Moderate (*)     WBCS 20 (*)     WHITE BLOOD CELL CLUMP Occasional (*)     All other components within normal limits   BLOOD GAS - Abnormal; Notable for the following components:    PH (ARTERIAL) 7.46 (*)     PO2 (ARTERIAL) 77 (*)     BICARBONATE (ARTERIAL) 29.6 (*)     BASE EXCESS (ARTERIAL) 6.0 (*)     All other components within normal limits    Narrative:     A reference range for Oxygen Saturation is provided but abnormal results will not flag in Epic.   C-REACTIVE PROTEIN (CRP) - Abnormal; Notable for the following components:    C-REACTIVE PROTEIN (CRP) 22.1 (*)     All other components within normal  limits   LACTIC ACID LEVEL W/ REFLEX FOR LEVEL >2.0 - Abnormal; Notable for the following components:    LACTIC ACID 2.3 (*)     All other components within normal limits   TROPONIN-I - Abnormal; Notable for the following components:    TROPONIN I 27 (*)     All other components within normal limits   LDH - Abnormal; Notable for the following components:    LDH 596 (*)     All other components within normal limits   BLOOD GAS W/ LACTATE REFLEX - Abnormal; Notable for the following components:    PH (ARTERIAL) 7.48 (*)     PO2 (ARTERIAL) 80 (*)     BICARBONATE (ARTERIAL) 29.4 (*)     BASE EXCESS (ARTERIAL) 5.8 (*)     LACTATE 2.5 (*)     All other components within normal limits    Narrative:     A reference range for Oxygen Saturation is provided but abnormal results will not flag in Epic.   LACTIC ACID LEVEL W/ REFLEX FOR LEVEL >2.0 - Normal   PTT (PARTIAL THROMBOPLASTIN TIME) - Normal   COVID-19, FLU A/B, RSV RAPID BY PCR - Normal    Narrative:     Results are for the simultaneous qualitative identification of SARS-CoV-2 (formerly 2019-nCoV), Influenza A, Influenza B, and RSV RNA. These etiologic agents are generally detectable in nasopharyngeal and nasal swabs during the ACUTE PHASE of infection. Hence, this test is intended to be performed on respiratory specimens collected from individuals with signs and symptoms  of upper respiratory tract infection who meet Centers for Disease Control and Prevention (CDC) clinical and/or epidemiological criteria for Coronavirus Disease 2019 (COVID-19) testing. CDC COVID-19 criteria for testing on human specimens is available at [facility] webpage information for Healthcare Professionals: Coronavirus Disease 2019 (COVID-19) ([email or web address]).     False-negative results may occur if the virus has genomic mutations, insertions, deletions, or rearrangements or if performed very early in the course of illness. Otherwise, negative results indicate  virus specific RNA targets are not detected, however negative results do not preclude SARS-CoV-2 infection/COVID-19, Influenza, or Respiratory syncytial virus infection. Results should not be used as the sole basis for patient management decisions. Negative results must be combined with clinical observations, patient history, and epidemiological information. If upper respiratory tract infection is still suspected based on exposure history together with other clinical findings, re-testing should be considered.    Test methodology:   Cepheid Xpert Xpress SARS-CoV-2/Flu/RSV Assay real-time polymerase chain reaction (RT-PCR) test on the GeneXpert Dx and Xpert Xpress systems.   LACTIC ACID - FIRST REFLEX - Normal   B-TYPE NATRIURETIC PEPTIDE (BNP),PLASMA - Normal    Narrative:                                 Class 1: 101-250 pg/mL                              Class 2: 251-550 pg/mL                              Class 3: 551-900 pg/mL                              Class 4: >901 pg/mL     The [place]  [employer] has developed a four-stage functional classification system for CHF that is based on a subjective interpretation of the severity of a patient's clinical signs and symptoms.    Class 1 - Patients have no limitations on physical activity and have no symptoms with ordinary physical activity.    Class 2 - Patients have a slight limitation of physical activity and have symptoms with ordinary physical activity.    Class 3 - Patients have a marked limitation of physical activity and have symptoms with less than ordinary physical activity, but not at rest.    Class 4 - Patients are unable to perform any physical activity without discomfort.   ADULT ROUTINE BLOOD CULTURE, SET OF 2 BOTTLES (BACTERIA AND YEAST)   ADULT ROUTINE BLOOD CULTURE, SET OF 2 BOTTLES (BACTERIA AND YEAST)   URINE CULTURE,ROUTINE   MRSA SCREEN   STREP PNEUMONIAE AND LEGIONELLA ANTIGEN, URINE   EXTENDED RESPIRATORY VIRUS PANEL   CBC/DIFF     Narrative:     The following orders were created for panel order CBC/DIFF.  Procedure                               Abnormality         Status                     ---------                               -----------         ------  CBC WITH DIFF[[id number]]                Abnormal            Final result                 Please view results for these tests on the individual orders.   URINALYSIS, MACROSCOPIC AND MICROSCOPIC W/CULTURE REFLEX    Narrative:     The following orders were created for panel order URINALYSIS, MACROSCOPIC AND MICROSCOPIC W/CULTURE REFLEX.  Procedure                               Abnormality         Status                     ---------                               -----------         ------                     URINALYSIS, MACROSCOPIC[[id number]]      Abnormal            Final result               URINALYSIS, MICROSCOPIC[[id number]]      Abnormal            Final result                 Please view results for these tests on the individual orders.   THYROID  STIMULATING HORMONE WITH FREE T4 REFLEX   SEDIMENTATION RATE   C3 COMPLEMENT, SERUM   C4 COMPLEMENT, SERUM   ANA SCREEN BY IFA WITH REFLEX TO TITER/PATTERN AND SEROLOGY CASCADE, SERUM   ANCA VASCULITIS PANEL, SERUM    Narrative:     The following orders were created for panel order ANCA VASCULITIS PANEL, SERUM.  Procedure                               Abnormality         Status                     ---------                               -----------         ------                     [employer].SABRASABRA[[id number]]                      In process                 PROTEINASE 3 ANTIBODIES,.SABRASABRA[[id number]]                      In process                   Please view results for these tests on the individual orders.   HIV 1 AND 2 RAPID SCREEN   ASPERGILLUS (GALACTOMANNAN) ANTIGEN, SERUM   APOLIPOPROTEIN A1, SERUM   JO-1 IGG   RHEUMATOID  FACTOR, SERUM   CYCLIC CITRULLINATED PEPTIDE ANTIBODIES, IGG, SERUM   HYPERSENSITIVITY  PNEUMONITIS PANEL, IGG, SERUM   MYELOPEROXIDASE ANTIBODIES, IGG, SERUM   PROTEINASE 3 ANTIBODIES, IGG, SERUM   MYCOPLASMA PNEUMONIAE ANTIBODIES, IGG AND IGM, SERUM   HISTOPLASMA GALACTOMANNAN ANTIGEN, URINE   LACTIC ACID - FIRST REFLEX   LACTIC ACID - FIRST REFLEX   TYPE AND SCREEN       XR AP MOBILE CHEST   Final Result by Edi, Radresults In (12/26 1349)   1. ENDOTRACHEAL TUBE PROJECTS IN SATISFACTORY POSITION.   2. NG/OG TUBE TIP ABNORMALLY HIGH AT THE T1-2 LEVEL.               Radiologist location ID: [id number]         XR AP MOBILE CHEST   Final Result by Edi, Radresults In (12/26 1105)   Extensive bilateral airspace disease left worse than right.                  Radiologist location ID: [id number]                                                 Medications Ordered/Administered in the ED   NS flush syringe (has no administration in time range)   NS flush syringe (has no administration in time range)   NS 250 mL flush bag (has no administration in time range)     And   D5W 250 mL flush bag (has no administration in time range)   NS flush syringe (has no administration in time range)   NS flush syringe (has no administration in time range)   NS 250 mL flush bag (has no administration in time range)     And   D5W 250 mL flush bag (has no administration in time range)   vancomycin  (VANCOCIN ) 1,250 mg in NS 250 mL IVPB (has no administration in time range)   potassium chloride  20 mEq in SW 100 mL premix infusion (has no administration in time range)   alcohol  62 % (NOZIN NASAL SANITIZER) nasal swab packet (has no administration in time range)   levoFLOXacin  (LEVAQUIN ) 750 mg in D5W 150 mL premix IVPB (750 mg Intravenous New Bag/New Syringe 03/27/24 1135)   potassium chloride  20 mEq in SW 100 mL premix infusion (20 mEq Intravenous New Bag/New Syringe 03/27/24 1142)   methylPREDNISolone  sod succ (SOLU-medrol ) 125 mg/2 mL injection (125 mg Intravenous Given 03/27/24 1142)   furosemide  (LASIX ) 10 mg/mL injection (20  mg Intravenous Given 03/27/24 1142)       Patient will be admitted to the  service for further workup and management.    Disposition: Admitted        Risk as noted in the medical decision-making is in reference to potential morbidity/mortality of management based upon previously established billing guidelines.    Based upon the clinical setting, the likely diagnosis/impression include:    Clinical Impression   Hypoxic respiratory failure (Primary)         Current Discharge Medication List                This note was partially created using voice recognition software and is inherently subject to errors including those of syntax and sound alike  substitutions which may escape proof reading. In such instances, original meaning may be extrapolated by contextual derivation.

## 2024-03-27 NOTE — Consults (Signed)
 COpAT/ID eConsult Initial Note    Reason for Consult:  73 yo F with no prior lung pathology, initially pw ?pneumonitis, d/c to [facility] and later to rehab, pw worsening of her pneumonitis. Vasculitis workup negative. No birds. Does have cats. Was on steroids for last 6 weeks. Intubated for hypoxia. Will obtain bronch and send Clx. Allergy to cephalosporins. Needs FQ for possible mycoplasma and ?bactrim  given steroids?. Will send for BAL cultures.     Pertinent Clinical History:   13F admitted [facility] ICU 12/26 for shortness of breath and hypoxic respiratory failure.  Intubated and plans for bronch tomorrow for pulmonary infiltrates.  Afebrile.    Recent [facility] admission 11/14 to 11/24 for pneumonitis and treated with steroids.  Was at [facility] till 03/23/24 on Prednisolone 30 mg daily (with tapering schedule).    Antimicrobials:  IV Vanc    PMHx:   BMI 35, PUD    Antimicrobial Allergies:  Cefaclor    Labs, Micro, and Imaging Reviewed:    W 12.9, Hb 11.6, Plt 284  B/Cr 7/0.5, AST/ALT 22/17, LDH 596  HIV, HepB, ANCA negative  CRP 22.1  ABG on 60% 7.48/40/80    12/26 Blood and Urine cultures pending    12/25 Vs 11/14 CT Chest with worse, extensive bilateral infiltrates.          Assessment/Recommendations:   13F with no prior known pulmonary issues readmitted with bilateral pulmonary process after significant recent steroid exposure - unclear if progression of prior illness Vs new opportunistic infection    DC IV Vanc and start Levofloxacin  750 mg daily  Please check Fungitell assay - if elevated with ADD Bactrim   Agree with diagnostic bronchoscopy  Will follow    I spent 5 minutes or more reviewing the patient's medical record, lab/micro/imaging studies, and/or medications.  Communicated with provider through Secure Chat/In [employer].  Total time >31 min.     Maylin Freeburg R. Elige, MD  Professor, Infectious Diseases  Department of Medicine, [facility]  [place]

## 2024-03-27 NOTE — Respiratory Therapy (Addendum)
 03/27/24 1511   Vent Check   Equipment Hamilton C6   Mode PC   Assist Control Variable  Pressure Control   Facial Skin Integrity WNL   Inspired VT (machine) 347 mLs   Expired VT (machine) 330 mLs   ETCO2 34 mmHg   Set Rate 20 Breaths Per Minute   Total Rate 20 Breaths Per Minute   Spontaneous Rate 0 Breaths Per Minute   Actual Minute Volume 6.7 Liters   FiO2 60 %   MAP 13 cmH2O   Actual PEEP 6 cmH2O   Set PEEP 6 cmH2O   PC Set 22 cmH2O   PIP 29 cmH2O   Plateau Pressure 25 cmH2O   Driving Pressure (DP) (Calculated) 19 cmH20   Driving Pressure (C6 Vent Calculated) 19 cmH20   Static Compliance (Manual Calc) 17.3   I-Time 0.95 seconds   I:E Ratio 1:2.2   Inspiratory Flow 62.4 L/min   Sensitivity 5   Rise Time (Sec) 70 seconds     DECREASED RATE TO 20 TO CORRECT PH ALKALOSIS. BY DECREASEING VE AND TIDAL VOLUME .  PATIENT LUNG VOLUMES ARE LOW , THEREFORE NEED TIDAL VOLUMES 4-6 ML/KG WITH LOW PEEP

## 2024-03-27 NOTE — Respiratory Therapy (Signed)
 03/27/24 1815   Bronch Assist   Start Set-Up for Procedure 1815   $ Disposable Scopes $Disposable OD 6.2 Maud RT   $ Bronchoscopy Procedure Noting $Bronch Assist   Time Procedure Completed 1840     Assissted dr dennise

## 2024-03-27 NOTE — ED Nurses Note (Signed)
 Pt IV appears red and infiltrated. Medication stopped at this time and disconnected. Provider notified at this time.

## 2024-03-27 NOTE — Respiratory Therapy (Signed)
 Pt intubated and placed on ventilator per Dr.Leonard. Current ventilator settings of rate, PEEP, and Pressure Control mode per Dr. Dennise. Will continue to monitor pt.

## 2024-03-28 ENCOUNTER — Inpatient Hospital Stay (HOSPITAL_COMMUNITY)

## 2024-03-28 DIAGNOSIS — R195 Other fecal abnormalities: Secondary | ICD-10-CM

## 2024-03-28 DIAGNOSIS — J8281 Chronic eosinophilic pneumonia: Secondary | ICD-10-CM

## 2024-03-28 DIAGNOSIS — J479 Bronchiectasis, uncomplicated: Secondary | ICD-10-CM

## 2024-03-28 DIAGNOSIS — F32A Depression, unspecified: Secondary | ICD-10-CM

## 2024-03-28 DIAGNOSIS — R000 Tachycardia, unspecified: Secondary | ICD-10-CM

## 2024-03-28 DIAGNOSIS — R34 Anuria and oliguria: Secondary | ICD-10-CM

## 2024-03-28 DIAGNOSIS — R9431 Abnormal electrocardiogram [ECG] [EKG]: Secondary | ICD-10-CM

## 2024-03-28 LAB — BLOOD GAS W/ LYTES, LACTATE REFLEX
%FIO2 (ARTERIAL): 70 %
%FIO2 (ARTERIAL): 55 %
BASE EXCESS (ARTERIAL): 10.1 mmol/L — ABNORMAL HIGH (ref 0.0–3.0)
BASE EXCESS (ARTERIAL): 7.2 mmol/L — ABNORMAL HIGH (ref 0.0–3.0)
BICARBONATE (ARTERIAL): 32.8 mmol/L — ABNORMAL HIGH (ref 21.0–28.0)
BICARBONATE (ARTERIAL): 30.5 mmol/L — ABNORMAL HIGH (ref 21.0–28.0)
CHLORIDE: 100 mmol/L (ref 98–107)
CHLORIDE: 100 mmol/L (ref 98–107)
GLUCOSE: 124 mg/dL (ref 65–125)
GLUCOSE: 145 mg/dL — ABNORMAL HIGH (ref 65–125)
IONIZED CALCIUM: 1.2 mmol/L (ref 1.15–1.33)
IONIZED CALCIUM: 1.25 mmol/L (ref 1.15–1.33)
LACTATE: 0.9 mmol/L (ref <=1.9)
LACTATE: 1.4 mmol/L (ref <=1.9)
O2 SATURATION (ARTERIAL): 96 % (ref 94.0–98.0)
O2 SATURATION (ARTERIAL): 97.5 % (ref 94.0–98.0)
PAO2/FIO2 RATIO: 101
PAO2/FIO2 RATIO: 169
PCO2 (ARTERIAL): 38 mmHg (ref 35–45)
PCO2 (ARTERIAL): 48 mmHg — ABNORMAL HIGH (ref 35–45)
PH (ARTERIAL): 7.55 — ABNORMAL HIGH (ref 7.35–7.45)
PH (ARTERIAL): 7.44 (ref 7.35–7.45)
PO2 (ARTERIAL): 71 mmHg — ABNORMAL LOW (ref 83–108)
PO2 (ARTERIAL): 93 mmHg (ref 83–108)
SODIUM: 135 mmol/L — ABNORMAL LOW (ref 136–145)
SODIUM: 134 mmol/L — ABNORMAL LOW (ref 136–145)
WHOLE BLOOD POTASSIUM: 3.6 mmol/L (ref 3.5–5.1)
WHOLE BLOOD POTASSIUM: 3.2 mmol/L — ABNORMAL LOW (ref 3.5–5.1)

## 2024-03-28 LAB — COMPREHENSIVE METABOLIC PANEL, NON-FASTING
ALBUMIN/GLOBULIN RATIO: 0.9 (ref 0.8–1.4)
ALBUMIN: 2.8 g/dL — ABNORMAL LOW (ref 3.5–5.7)
ALKALINE PHOSPHATASE: 80 U/L (ref 34–104)
ALT (SGPT): 13 U/L (ref 7–52)
ANION GAP: 10 mmol/L (ref 4–13)
AST (SGOT): 15 U/L (ref 13–39)
BILIRUBIN TOTAL: 0.8 mg/dL (ref 0.3–1.0)
BUN/CREA RATIO: 35 — ABNORMAL HIGH (ref 6–22)
BUN: 13 mg/dL (ref 7–25)
CALCIUM, CORRECTED: 10 mg/dL (ref 8.9–10.8)
CALCIUM: 9 mg/dL (ref 8.6–10.3)
CHLORIDE: 99 mmol/L (ref 98–107)
CO2 TOTAL: 30 mmol/L (ref 21–31)
CREATININE: 0.37 mg/dL — ABNORMAL LOW (ref 0.60–1.30)
ESTIMATED GFR: 108 mL/min/1.73mˆ2 (ref >59)
GLOBULIN: 3.1 (ref 2.0–3.5)
GLUCOSE: 131 mg/dL — ABNORMAL HIGH (ref 74–109)
OSMOLALITY, CALCULATED: 280 mosm/kg (ref 270–290)
POTASSIUM: 3.8 mmol/L (ref 3.5–5.1)
PROTEIN TOTAL: 5.9 g/dL — ABNORMAL LOW (ref 6.4–8.9)
SODIUM: 139 mmol/L (ref 136–145)

## 2024-03-28 LAB — MAGNESIUM: MAGNESIUM: 1.5 mg/dL — ABNORMAL LOW (ref 1.9–2.7)

## 2024-03-28 LAB — CBC
HCT: 31.2 % (ref 31.2–41.9)
HGB: 10.7 g/dL — ABNORMAL LOW (ref 10.9–14.3)
MCH: 29.5 pg (ref 24.7–32.8)
MCHC: 34.3 g/dL (ref 32.3–35.6)
MCV: 85.8 fL (ref 75.5–95.3)
MPV: 6.6 fL — ABNORMAL LOW (ref 7.9–10.8)
PLATELETS: 283 x10ˆ3/uL (ref 140–440)
RBC: 3.64 x10ˆ6/uL (ref 3.63–4.92)
RDW: 18.4 % — ABNORMAL HIGH (ref 12.3–17.7)
WBC: 10.6 x10ˆ3/uL (ref 3.8–11.8)

## 2024-03-28 LAB — PHOSPHORUS: PHOSPHORUS: 3.5 mg/dL — ABNORMAL LOW (ref 3.7–7.2)

## 2024-03-28 LAB — JO-1 IGG: JO1 IGG QUALITATIVE: NEGATIVE

## 2024-03-28 LAB — ECG 12 LEAD
Atrial Rate: 122 {beats}/min
Atrial Rate: 98 {beats}/min
Calculated P Axis: 22 degrees
Calculated P Axis: 29 degrees
Calculated R Axis: -22 degrees
Calculated R Axis: -18 degrees
Calculated T Axis: 1 degrees
Calculated T Axis: 25 degrees
PR Interval: 136 ms
PR Interval: 120 ms
QRS Duration: 72 ms
QRS Duration: 82 ms
QT Interval: 314 ms
QT Interval: 342 ms
QTC Calculation: 447 ms
QTC Calculation: 436 ms
Ventricular rate: 122 {beats}/min
Ventricular rate: 98 {beats}/min

## 2024-03-28 LAB — BLOOD GAS W/ LACTATE REFLEX
%FIO2 (ARTERIAL): 70 %
BASE EXCESS (ARTERIAL): 7.5 mmol/L — ABNORMAL HIGH (ref 0.0–3.0)
BICARBONATE (ARTERIAL): 30.8 mmol/L — ABNORMAL HIGH (ref 21.0–28.0)
LACTATE: 0.9 mmol/L (ref <=1.9)
O2 SATURATION (ARTERIAL): 97.9 % (ref 94.0–98.0)
PAO2/FIO2 RATIO: 140
PCO2 (ARTERIAL): 47 mmHg — ABNORMAL HIGH (ref 35–45)
PH (ARTERIAL): 7.45 (ref 7.35–7.45)
PO2 (ARTERIAL): 98 mmHg (ref 83–108)

## 2024-03-28 LAB — CYCLIC CITRULLINATED PEPTIDE ANTIBODIES, IGG, SERUM
CYCLIC CITRULLINATED PEPTIDE ANTIBODY IGG QUAL: NEGATIVE
CYCLIC CITRULLINATED PEPTIDE ANTIBODY IGG QUANT: <0.5 U/mL (ref <3.0)

## 2024-03-28 LAB — MYELOPEROXIDASE ANTIBODIES, IGG, SERUM
MYELOPEROXIDASE ANTIBODIES IGG QUALITATIVE: NEGATIVE
MYELOPEROXIDASE ANTIBODIES IGG QUANTITATIVE: <0.2 [AU]/ml (ref <1.0)

## 2024-03-28 LAB — PROTEINASE 3 ANTIBODIES, IGG, SERUM
PROTEINASE ANTIBODIES IGG, QUALITATIVE: NEGATIVE
PROTEINASE ANTIBODIES IGG, QUANTITATIVE: <0.2 [AU]/ml (ref <1.0)

## 2024-03-28 LAB — C4 COMPLEMENT, SERUM: C4 COMPLEMENT: 37 mg/dL (ref 12–39)

## 2024-03-28 LAB — C3 COMPLEMENT, SERUM: C3 COMPLEMENT: 153 mg/dL (ref 81–157)

## 2024-03-28 MED ORDER — SULFAMETHOXAZOLE 800 MG-TRIMETHOPRIM 160 MG TABLET
2.0000 | ORAL_TABLET | Freq: Three times a day (TID) | ORAL | Status: DC
  Administered 2024-03-28 – 2024-03-30 (×8): 320 mg via ORAL
  Filled 2024-03-28 (×8): qty 2

## 2024-03-28 MED ORDER — CHOLECALCIFEROL (VITAMIN D3) 25 MCG (1,000 UNIT) TABLET
2000.0000 [IU] | ORAL_TABLET | Freq: Every day | ORAL | Status: DC
  Administered 2024-03-28 – 2024-03-30 (×3): 2000 [IU] via ORAL
  Filled 2024-03-28 (×3): qty 2

## 2024-03-28 MED ORDER — ATORVASTATIN 40 MG TABLET
80.0000 mg | ORAL_TABLET | Freq: Every evening | ORAL | Status: DC
  Administered 2024-03-28: 80 mg via ORAL
  Administered 2024-03-28: 0 mg via ORAL
  Administered 2024-03-29: 80 mg via ORAL
  Filled 2024-03-28 (×2): qty 2

## 2024-03-28 MED ORDER — LACTULOSE 10 GRAM/15 ML ORAL SOLUTION
30.0000 mL | Freq: Every day | ORAL | Status: DC
  Administered 2024-03-28 – 2024-03-30 (×3): 30 mL via ORAL
  Filled 2024-03-28 (×3): qty 30

## 2024-03-28 MED ORDER — MAGNESIUM SULFATE 2 GRAM/50 ML (4 %) IN WATER INTRAVENOUS PIGGYBACK
2.0000 g | INJECTION | Freq: Once | INTRAVENOUS | Status: AC
  Administered 2024-03-28: 0 g via INTRAVENOUS
  Administered 2024-03-28: 2 g via INTRAVENOUS
  Filled 2024-03-28: qty 50

## 2024-03-28 MED ORDER — FLUOXETINE 20 MG CAPSULE
20.0000 mg | ORAL_CAPSULE | Freq: Every day | ORAL | Status: DC
  Administered 2024-03-28 – 2024-03-30 (×3): 20 mg via ORAL
  Filled 2024-03-28 (×3): qty 1

## 2024-03-28 MED ORDER — IPRATROPIUM 0.5 MG-ALBUTEROL 3 MG (2.5 MG BASE)/3 ML NEBULIZATION SOLN
3.0000 mL | INHALATION_SOLUTION | Freq: Four times a day (QID) | RESPIRATORY_TRACT | Status: DC
  Administered 2024-03-28 – 2024-03-30 (×11): 3 mL via RESPIRATORY_TRACT
  Filled 2024-03-28: qty 3

## 2024-03-28 MED ORDER — TAMSULOSIN 0.4 MG CAPSULE
0.4000 mg | ORAL_CAPSULE | Freq: Every evening | ORAL | Status: DC
  Administered 2024-03-28 – 2024-03-29 (×2): 0.4 mg via ORAL
  Filled 2024-03-28 (×2): qty 1

## 2024-03-28 MED ORDER — SODIUM CHLORIDE 0.9 % INTRAVENOUS SOLUTION
0.0000 ug/kg/h | INTRAVENOUS | Status: DC
  Administered 2024-03-28: 0.8 ug/kg/h via INTRAVENOUS
  Administered 2024-03-29: 1.4 ug/kg/h via INTRAVENOUS
  Administered 2024-03-29: 1 ug/kg/h via INTRAVENOUS
  Administered 2024-03-29: 0.9 ug/kg/h via INTRAVENOUS
  Administered 2024-03-29: 1.2 ug/kg/h via INTRAVENOUS
  Administered 2024-03-29 (×2): 0.8 ug/kg/h via INTRAVENOUS
  Administered 2024-03-29: 0 ug/kg/h via INTRAVENOUS
  Administered 2024-03-29: 1.5 ug/kg/h via INTRAVENOUS
  Administered 2024-03-29 – 2024-03-30 (×2): 1.1 ug/kg/h via INTRAVENOUS
  Administered 2024-03-30 (×2): 0.9 ug/kg/h via INTRAVENOUS
  Administered 2024-03-30: 1.1 ug/kg/h via INTRAVENOUS
  Filled 2024-03-28 (×6): qty 10

## 2024-03-28 MED ORDER — ALPRAZOLAM 1 MG TABLET
1.0000 mg | ORAL_TABLET | Freq: Two times a day (BID) | ORAL | Status: DC
  Administered 2024-03-28 – 2024-03-29 (×3): 1 mg via ORAL
  Filled 2024-03-28 (×3): qty 1

## 2024-03-28 MED ORDER — BUDESONIDE 0.5 MG/2 ML SUSPENSION FOR NEBULIZATION
1.0000 mg | INHALATION_SUSPENSION | Freq: Two times a day (BID) | RESPIRATORY_TRACT | Status: DC
  Administered 2024-03-28 – 2024-03-30 (×5): 1 mg via RESPIRATORY_TRACT

## 2024-03-28 MED ORDER — SODIUM CHLORIDE 0.9 % FOR NEBULIZATION
3.0000 mL | INHALATION_SOLUTION | Freq: Two times a day (BID) | RESPIRATORY_TRACT | Status: DC
  Administered 2024-03-28 – 2024-03-30 (×5): 3 mL via RESPIRATORY_TRACT
  Filled 2024-03-28 (×2): qty 5

## 2024-03-28 MED ORDER — ASPIRIN 81 MG CHEWABLE TABLET
81.0000 mg | CHEWABLE_TABLET | Freq: Every day | ORAL | Status: DC
  Administered 2024-03-28 – 2024-03-30 (×3): 81 mg via ORAL
  Filled 2024-03-28 (×3): qty 1

## 2024-03-28 MED ORDER — DEXMEDETOMIDINE 200 MCG/50 ML (4 MCG/ML) IN 0.9 % SODIUM CHLORIDE IV
0.0000 ug/kg/h | INTRAVENOUS | Status: DC
  Administered 2024-03-28: 0.2 ug/kg/h via INTRAVENOUS
  Administered 2024-03-28: 0.8 ug/kg/h via INTRAVENOUS
  Administered 2024-03-28: 0.6 ug/kg/h via INTRAVENOUS
  Administered 2024-03-28: 0.4 ug/kg/h via INTRAVENOUS
  Administered 2024-03-28 (×3): 0.8 ug/kg/h via INTRAVENOUS
  Administered 2024-03-28: 1 ug/kg/h via INTRAVENOUS
  Administered 2024-03-28: 0.8 ug/kg/h via INTRAVENOUS
  Administered 2024-03-29: 1.3 ug/kg/h via INTRAVENOUS
  Administered 2024-03-29: 0 ug/kg/h via INTRAVENOUS
  Filled 2024-03-28 (×7): qty 50

## 2024-03-28 NOTE — Nurses Notes (Signed)
 Patient tube feed paused at this time. Patient has green bile coming from mouth and 350 mL residual removed from OG tube. Air clearly auscultated over abdomen and OG length has not moved since confirming Xray. Only 16 mL of tube feeding had been given. Adina Larve, FNP and Dr. Dennise notified. Repeat KUB ordered.

## 2024-03-28 NOTE — Respiratory Therapy (Signed)
 03/28/24 1708   Vent Assessment   Start Time 1708   Orders Updated in EMR Yes   $ BMU Checked (Resp only) S   Head of Bed (HOB) Positioning HOB elevated   Oxygen Noting/Safety Checks Bag/Mask Unit Checked and Functioning Properly   Assessment Routine Check   Proximal  Temperature 35 C (95 F)   H2O Bag (VENT) Checked   HEPA/Hydrophobic Filter checked   Circuit Checked   Inline Suction Catheter Checked   $ Cuff Check (Resp only) Y   Method used PM   Cuff Inflated   Cuff Pressure 32 cmH2O   PETCO2 33 mmHg   Airway   Airway Type ETT   Breath Sounds   L General Breath Sounds Clear   R General Breath Sounds Clear   Throughout All Lung Fields All Fields   EndoTracheal Tube 8.0 22;Right Lip   Intubation Date/Intubation Time: 03/27/24 1320   Reason For Intubation: Emergent  Tube Size: 8.0  Position: 22;Right  Landmark: Lip  Intubation Confirmation: Chest Xray;ETCO2;Auscultation   Airway Secure Bite Block;Device   Position Change Yes;Left   ET tube measurement (cm) 23 cm   ET Tube Landmark at the Lip   Change Reason Routine   Retaped N   Physical Assessment of ETT Performed by RT   Vent Check   Equipment Hamilton C6   Mode PC   Assist Control Variable  Pressure Control   Facial Skin Integrity WNL   Inspired VT (machine) 445 mLs   Expired VT (machine) 424 mLs   Set Rate 16 Breaths Per Minute   Total Rate 16 Breaths Per Minute   Spontaneous Rate 0 Breaths Per Minute   Actual Minute Volume 6.3 Liters   FiO2 60 %   MAP 11 cmH2O   Actual PEEP 6 cmH2O   Set PEEP 6 cmH2O   PC Set 22 cmH2O   PIP 28 cmH2O   Plateau Pressure 24 cmH2O   Driving Pressure (DP) (Calculated) 18 cmH20   Driving Pressure (C6 Vent Calculated) 18 cmH20   Static Compliance (Manual Calc) 23.4   I-Time 0.95 seconds   I:E Ratio 1:2.9   Inspiratory Flow 55.8 L/min   Sensitivity 3   Vent Alarms   Audible Alarms Checked & Functioning   Hi Pressure 45 cmH2O   Lo Pressure 5 cmH2O   High Min Volume 20 Liters   Low Min Volume 2 Liters   Hi RR 40 breaths per minute    Low RR 5 breaths per minute   High Spont VT 1000 mL   Low Spont VT 100 mL   High Set VT 1000 mL   Low Set VT 100 mL   Apnea Alarm 20 seconds   Timepoint   Stop Time 1720   Vent Check Duration 12 minutes     No changes mad to vent at this time. PT is resting comfortably with adequate ventilation on above settings. ET tube is 23 cm @ the lip, et tube was moved to the left.

## 2024-03-28 NOTE — Progress Notes (Signed)
 [facility]   Critical Care Progress Note    DAWNELLE WARMAN  Date of service: 03/28/2024  Date of Admission:  03/27/2024  Hospital Day:  LOS: 1 day     Summary:  73 yo M with PMH depression, anxiety w no pulm Hx and recent admission 11/14 w ?pneumonitis treated w antibiotics and steroids, DC to [facility] and then [facility] pw worsening SOB. CTA neg for PE but continued to show worsening diffuse patchy GGOs w possible trapping and peripheral consolidations. Intubated in ER. S/p Bronch and BAL RLL, negative for DAH.     Subjective/Objective: Intubated, awake, following commands    Review of Systems:    As per above    Vital signs: BP 127/78   Pulse 81   Temp 36.2 C (97.1 F)   Resp 20   Ht 1.549 m (5' 1)   Wt 83.5 kg (184 lb 1.4 oz)   SpO2 95%   BMI 34.78 kg/m         Vent Settings:  Vent Check  Equipment: Hamilton C6  Mode: 2  Assist Control Variable : Pressure Control  Facial Skin Integrity: WNL  Inspired VT (machine): 329 mLs  Expired VT (machine): 330 mLs  ETCO2: 34 mmHg  Set Rate: 18 Breaths Per Minute  Total Rate: 18 Breaths Per Minute  Spontaneous Rate: 0 Breaths Per Minute  Actual Minute Volume: 6 Liters  FiO2: 70 %  MAP: 12 cmH2O  IPAP: 14 cmH20  Actual PEEP: 6 cmH2O  Set PEEP: 6 cmH2O  PC Set: 22 cmH2O  PIP: 29 cmH2O  Plateau Pressure: 26 cmH2O  Driving Pressure (DP) (Calculated): 20 cmH20  Driving Pressure (C6 Vent Calculated): 20 cmH20  Static Compliance (Manual Calc): 17  I-Time: 0.95 seconds  I:E Ratio: 1:2.5  Inspiratory Flow: 56.2 L/min  Sensitivity: 3  Rise Time (Sec): 7 seconds    I/O:  I/O last 24 hours:    Intake/Output Summary (Last 24 hours) at 03/28/2024 0842  Last data filed at 03/28/2024 0600  Gross per 24 hour   Intake 295.19 ml   Output 895 ml   Net -599.81 ml       Exam:  Constitutional: Intubated, awake, following commands  Eyes: Pupils are equal, round, and reactive to light.   Cardiovascular: S1, S2, Normal rate, regular rhythm and normal heart  sounds. No murmur heard.  Pulmonary/Chest: Breath sounds decreased b/l lower lungs. No respiratory distress. no wheezes.  no rales.   Abdominal: Soft. Bowel sounds are normal.  no distension. no abdominal tenderness.   Neurological: no gross focal deficit, intubated       alcohol  62 % (NOZIN NASAL SANITIZER) nasal swab packet, 1 Each, Each Nostril, 2x/day  ALPRAZolam  (XANAX ) tablet, 1 mg, Oral, 2x/day  aspirin  chewable tablet 81 mg, 81 mg, Oral, Daily  atorvastatin  (LIPITOR) tablet, 80 mg, Oral, QPM  budesonide  (PULMICORT  RESPULES) 0.5 mg/2 mL nebulizer suspension, 1 mg, Nebulization, 2x/day  NS 250 mL flush bag, , Intravenous, Q15 Min PRN   And  D5W 250 mL flush bag, , Intravenous, Q15 Min PRN  NS 250 mL flush bag, , Intravenous, Q15 Min PRN   And  D5W 250 mL flush bag, , Intravenous, Q15 Min PRN  dexmedeTOMIDine  (PRECEDEX ) 200 mcg in NS 50mL infusion, 0-1.5 mcg/kg/hr (Adjusted), Intravenous, Continuous  enoxaparin  PF (LOVENOX ) 40 mg/0.4 mL SubQ injection, 40 mg, Subcutaneous, Q24H  fentaNYL  (SUBLIMAZE ) 50 mcg/mL injection, 100 mcg, Intravenous, Once  fentaNYL  (SUBLIMAZE ) 50 mcg/mL IV  infusion, 0-300 mcg/hr, Intravenous, Continuous  FLUoxetine  (PROzac ) capsule, 20 mg, Oral, Daily  ipratropium-albuterol  0.5 mg-3 mg(2.5 mg base)/3 mL Solution for Nebulization, 3 mL, Nebulization, 4x/day  levoFLOXacin  (LEVAQUIN ) 750 mg in D5W 150 mL premix IVPB, 750 mg, Intravenous, Q24H  magnesium  sulfate 2 G in SW 50 mL premix IVPB, 2 g, Intravenous, Once  methylPREDNISolone  sod succ (SOLU-medrol ) 125 mg/2 mL injection, 250 mg, Intravenous, Q12H  midazolam  (VERSED ) 5 mg/mL injection, 2 mg, Intravenous, Once  norepinephrine  (LEVOPHED ) 16 mg in NS 250mL premix infusion, 0-0.3 mcg/kg/min, Intravenous, Continuous  NS flush syringe, 3 mL, Intracatheter, Q8HRS  NS flush syringe, 3 mL, Intracatheter, Q1H PRN  NS flush syringe, 3 mL, Intracatheter, Q8HRS  NS flush syringe, 3 mL, Intracatheter, Q1H PRN  pantoprazole  (PROTONIX ) 4 mg/mL  injection, 40 mg, Intravenous, Daily  polyethylene glycol (MIRALAX ) oral packet, 17 g, Oral, Daily  potassium chloride  20 mEq in SW 100 mL premix infusion, 20 mEq, Intravenous, Now  sennosides-docusate sodium  (SENOKOT-S) 8.6-50mg  per tablet, 1 Tablet, Oral, 2x/day  sodium chloride  (0.9% SALINE) nebulizer solution, 3 mL, Nebulization, 2x/day  tamsulosin  (FLOMAX ) capsule, 0.4 mg, Oral, Daily after Dinner  trimethoprim -sulfamethoxazole  (BACTRIM  DS) 160-800mg  per tablet, 2 Tablet, Oral, Q8H        Labs:  CBC   Recent Labs     03/27/24  1114 03/27/24  1901 03/28/24  0513   WBC 12.9* 10.6 10.6   HGB 11.6 11.2 10.7*   HCT 35.4 33.1 31.2   PLTCNT 284 284 283      Chemistries   Recent Labs     03/27/24  0554 03/27/24  1114 03/27/24  1736 03/27/24  1925 03/27/24  1959 03/27/24  2130 03/28/24  0513 03/28/24  0814   SODIUM 138 133*  137   < > 136   < > 133* 139 135*   POTASSIUM 3.4* 3.5  --  4.1  --   --  3.8  --    CHLORIDE 99 98  98   < > 98   < > 98 99 100   CO2 31 28  --  32*  --   --  30  --    BUN 6* 7  --  11  --   --  13  --    CREATININE 0.40* 0.51*  --  0.47*  --   --  0.37*  --    CALCIUM 8.4* 8.8  --  8.5*  --   --  9.0  --    ALBUMIN  3.0* 3.3*  --   --   --   --  2.8*  --    MAGNESIUM   --   --   --   --   --   --  1.5*  --     < > = values in this interval not displayed.      Liver Enzymes   Recent Labs     03/27/24  0554 03/27/24  1114 03/28/24  0513   TOTALPROTEIN 5.8* 6.6 5.9*   ALBUMIN  3.0* 3.3* 2.8*   AST 17 22 15    ALT 16 17 13    ALKPHOS 76 92 80      Inflammatory Markers   Recent Labs     03/27/24  1114 03/27/24  1245 03/27/24  1325 03/27/24  1434 03/27/24  1631 03/27/24  2026   CRP 22.1*  --   --   --   --   --    LACTICACID 2.2 1.8 2.3*  2.5* 1.9 1.0       Arterial Blood Gases   Recent Labs     03/27/24  1959 03/27/24  2130 03/28/24  0814   PH 7.47* 7.50* 7.55*   PCO2 45 42 38   PO2 70* 82* 71*   BICARBONATE 31.1* 31.7* 32.8*   BASEEXCESS 8.0* 8.7* 10.1*      Cardiac & Coags   Recent Labs      03/27/24  1114 03/27/24  1245   BNP  --  63   INR 1.30*  --       Lipid Panel   No results for input(s): HDL, CHOL in the last 72 hours.    Invalid input(s): LDL     Urinalysis:   Recent Labs     03/27/24  1217 03/27/24  1240 03/28/24  0814   LEUKOCYTES 500*  --   --    NITRITE Negative  --   --    UROBILINOGEN Normal  --   --    PROTEIN Negative  --   --    PH  --    < > 7.55*   BILIRUBIN Negative  --   --    GLUCOSE  --    < > 124    < > = values in this interval not displayed.        Radiology:  Results for orders placed or performed during the hospital encounter of 03/27/24 (from the past 24 hours)   XR AP MOBILE CHEST     Status: None    Narrative    Senora K Vanevery    RADIOLOGIST: Oneil LITTIE Raw, MD    XR AP MOBILE CHEST performed on 03/27/2024 10:55 AM    CLINICAL HISTORY: Sepsis Work-Up  Sepsis work-up    TECHNIQUE: Frontal view of the chest.    COMPARISON:  02/19/2024    FINDINGS:    The heart size is normal.   There are decreased lung volumes bilaterally. Extensive bilateral airspace disease left worse than right is worse since 02/19/2024.           Impression    Extensive bilateral airspace disease left worse than right.            Radiologist location ID: [id number]     XR AP MOBILE CHEST     Status: None    Narrative    Preesha K Sutliff    RADIOLOGIST: Franchot Dale Scales, MD    XR AP MOBILE CHEST performed on 03/27/2024 1:41 PM    CLINICAL HISTORY: et tube    TECHNIQUE: Frontal view of the chest 1322 hours.    COMPARISON:  1049 hours today    FINDINGS:  Endotracheal tube tip projects approximately 3.5 cm above the carina.  NG/OG tube tip is at the level of the thoracic inlet.     Cardiac and mediastinal contours are stable.   No significant change in the appearance of the lungs.   Diffuse bilateral pulmonary opacities persist.  Aeration of the right lung base appears minimally improved since this morning.        Impression    1. ENDOTRACHEAL TUBE PROJECTS IN SATISFACTORY POSITION.  2. NG/OG TUBE  TIP ABNORMALLY HIGH AT THE T1-2 LEVEL.          Radiologist location ID: [id number]     XR ABD FLAT AND UPRIGHT SERIES (W PA CHEST)     Status: None    Narrative    Nikkol K Aragones  RADIOLOGIST: Arley Blow, MD    XR ABD FLAT AND UPRIGHT SERIES (W PA CHEST) performed on 03/27/2024 7:01 PM    CLINICAL HISTORY: vent  Vent    TECHNIQUE: Single view chest with supine and upright views of the abdomen.    COMPARISON:  Prior exam from today    FINDINGS:  Heart size is mildly enlarged.     As before there are perihilar and infrahilar infiltrates/vascular congestion. There is an ET tube 3.7 cm above the carina     There is gaseous intestinal distention especially the stomach. There is retained stool in the rectum and in the right colon  No free air.    There is aortic calcification    There is left lower dorsal-upper lumbar scoliosis and right mid lumbar scoliosis with multilevel lumbar disc narrowing and spurring        Impression    1. Persistent infiltrates/vascular congestion similar to the prior exam.  2. Ileus gas pattern with gaseous distention of the stomach.          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 CTA neg for PE but continued to show worsening diffuse patchy GGOs w possible trapping and peripheral consolidations. Intubated in ER. S/p Bronch and BAL RLL, negative for DAH.     # Acute hypoxic respiratory failure w b/l pneumonitis  # Extensive bilateral GGOs   # Anxiety and depression  # Hx of moderate L  hydronephrosis  # ileus  # Hypomagnesemia    - Previously hospitalized on 11/14 w extensive GGOs. Does not smoke or vap. No birds or hot tubes at homes. Does have cats. CRP was elevated at 22. ANA, MPO, PR3 negative. IgE was low at 6. Patient was Dx w pneumonitis and d/c to [facility] and then [facility]. Worsening hypoxia w CTA done consistent w worsening GGOs, diffuse and patchy, w peripheral consolidations. Intubated in ER. S/p Bronch and BAL RLL, negative for DAH.   - Widespread extensive diffuse and patchy GGOs w peripheral consolidations w traction bronchiectasis. Negative subpleural sparing. No honeycombing. Negative crazy paving. Upper middle peripheral consolidations+. Broad differential but Cryptogenic organizing penumonia, atypical/mycoplasma, chronic eosinophilic pneumonia on list. Cannot r/o PJP, HSP.  - Continue Levofloxacin  for atypical. Added bactrim  since patient on steroids x 1 month and currently on pulse dose steroids D2/3. Will await for BAL results. If consistent w COP, needs to be on high dose 1mg /kg for atleast a month and w tapering and total duration around 1 year of tapering steroids. Continue bactrim  for PJP Ppx.  - RF and HIV negative. F/u pneumonitis work up  - Renal US  given previous Hx of hydronephrosis. F/u urine output  - Precedex  and fentanyl . KUB w ?ileus. Repeat  - Possible trickle tube feeds if ileus improves  - Replete electrolytes  - ABG x 2 today  - Add vitamin D daily given steroids  -  Previous echo WNL. No evidence of fluid overload. Avoid continuous fluids.     Lovenox   PPI  NPO    Maryjane Stank, MD  Intensivist

## 2024-03-28 NOTE — Respiratory Therapy (Signed)
 03/28/24 0100   Vent Assessment   Start Time 0120   Orders Updated in EMR Yes   $ BMU Checked (Resp only) S   Head of Bed (HOB) Positioning HOB elevated   Oxygen Noting/Safety Checks Bag/Mask Unit at Bedside Per Dept. Policy   Assessment Routine Check   Proximal  Temperature 35 C (95 F)   H2O Bag (VENT) Checked   HEPA/Hydrophobic Filter checked   Circuit Checked   Inline Suction Catheter Checked   $ Cuff Check (Resp only) Y   Method used MOV   Cuff Inflated   Heart Rate 67   SpO2 94 %   Airway   Airway Type ETT   Breath Sounds   L General Breath Sounds Diminished   R General Breath Sounds Diminished   EndoTracheal Tube 8.0 22;Right Lip   Intubation Date/Intubation Time: 03/27/24 1320   Reason For Intubation: Emergent  Tube Size: 8.0  Position: 22;Right  Landmark: Lip  Intubation Confirmation: Chest Xray;ETCO2;Auscultation   Airway Secure Device;Bite Block   Position Change Yes   ET tube measurement (cm) 22 cm   ET Tube Landmark at the Lip   Change Reason Routine   Physical Assessment of ETT Performed by RT   Bilateral General Breath Sounds Diminished   Vent Check   Equipment Hamilton C6   Mode PC   Assist Control Variable  Pressure Control   Facial Skin Integrity WNL   Inspired VT (machine) 400 mLs   Expired VT (machine) 397 mLs   Set Rate 18 Breaths Per Minute   Total Rate 18 Breaths Per Minute   Actual Minute Volume 7.3 Liters   FiO2 70 %   MAP 13 cmH2O   Actual PEEP 5 cmH2O   Set PEEP 5 cmH2O   PC Set 28 cmH2O   PIP 34 cmH2O   Plateau Pressure 18 cmH2O   Driving Pressure (DP) (Calculated) 13 cmH20   Driving Pressure (C6 Vent Calculated) 13 cmH20   Static Compliance (Manual Calc) 17.5   I-Time 0.95 seconds   I:E Ratio 1.25   Inspiratory Flow 59.6 L/min   Sensitivity 5   Rise Time (Sec) 7 seconds   Vent Alarms   Audible Alarms Checked & Functioning   Hi Pressure 40 cmH2O   Lo Pressure 5 cmH2O   High Min Volume 20 Liters   Low Min Volume 2 Liters   Hi RR 40 breaths per minute   Low RR 5 breaths per minute    High Spont VT 1000 mL   Low Spont VT 100 mL   High Set VT 1000 mL   Low Set VT 100 mL   Apnea Alarm 20 seconds   $Ventilator Charges$   $ Vent Charge Capture Vent ICU-Sub   Timepoint   Stop Time 0130   Vent Check Duration 10 minutes   PT on vent settings above. RT will continue to monitor.

## 2024-03-28 NOTE — Respiratory Therapy (Signed)
 03/28/24 1337   Vent Assessment   Start Time 1335   Orders Updated in EMR Yes   Assessment Vent Change   Reason For Change Abg   MD Notified Y   Breath Sounds   L General Breath Sounds Clear   R General Breath Sounds Clear   Throughout All Lung Fields All Fields   Vent Check   Equipment Hamilton C6   Mode PC   Assist Control Variable  Pressure Control   FiO2 60 %     Decreased pt's FIO2 due to recent abg results

## 2024-03-28 NOTE — Nurses Notes (Signed)
 OG tube advanced to 60 cm and hooked to low int suction per Dr. Dennise.

## 2024-03-28 NOTE — Care Plan (Signed)
 Patient remains on ventilator. Precedex  added for sedation. OG on low int suction for vomiting. Turned Q2H for skin integrity. Transition readiness assessment ongoing.           Problem: Adult Inpatient Plan of Care  Goal: Absence of Hospital-Acquired Illness or Injury  Outcome: Ongoing (see interventions/notes)  Intervention: Identify and Manage Fall Risk  Recent Flowsheet Documentation  Taken 03/28/2024 0800 by Elida CROME, RN  Safety Promotion/Fall Prevention: fall prevention program maintained  Intervention: Prevent Skin Injury  Recent Flowsheet Documentation  Taken 03/28/2024 1600 by Elida CROME, RN  Body Position:   supine, head elevated   heels elevated off mattress  Taken 03/28/2024 1400 by Elida CROME, RN  Body Position:   supine, head elevated   heels elevated off mattress  Taken 03/28/2024 1200 by Elida CROME, RN  Body Position:   supine, head elevated   heels elevated off mattress  Taken 03/28/2024 1000 by Elida CROME, RN  Body Position:   supine, head elevated   heels elevated off mattress  Taken 03/28/2024 0800 by Elida CROME, RN  Body Position:   supine, head elevated   heels elevated off mattress   elevated, UE left   elevated, UE right  Goal: Optimal Comfort and Wellbeing  Outcome: Ongoing (see interventions/notes)  Goal: Rounds/Family Conference  Outcome: Ongoing (see interventions/notes)     Problem: Skin Injury Risk Increased  Goal: Skin Health and Integrity  Outcome: Ongoing (see interventions/notes)  Intervention: Optimize Skin Protection  Recent Flowsheet Documentation  Taken 03/28/2024 0800 by Elida CROME, RN  Pressure Reduction Techniques:   Heels elevated off of the bed   Patient turned q 2 hours  Pressure Reduction Devices: Specialty bed/mattress utilized     Problem: Journalist, Newspaper Impaired  Goal: Optimal Gas Exchange  Outcome: Ongoing (see interventions/notes)     Problem: Mechanical Ventilation Invasive  Goal: Effective Communication  Outcome: Ongoing (see interventions/notes)  Goal: Optimal Device  Function  Outcome: Ongoing (see interventions/notes)  Intervention: Optimize Device Care and Function  Recent Flowsheet Documentation  Taken 03/28/2024 0800 by Elida CROME, RN  Airway Safety Measures:   mask valve resuscitator at bedside   manual resuscitator/mask at bedside   oxygen flowmeter at bedside   suction at bedside  Goal: Mechanical Ventilation Liberation  Outcome: Ongoing (see interventions/notes)  Goal: Optimal Nutrition Delivery  Outcome: Ongoing (see interventions/notes)  Goal: Absence of Device-Related Skin and Tissue Injury  Outcome: Ongoing (see interventions/notes)  Goal: Absence of Ventilator-Induced Lung Injury  Outcome: Ongoing (see interventions/notes)     Problem: Fall Injury Risk  Goal: Absence of Fall and Fall-Related Injury  Outcome: Ongoing (see interventions/notes)  Intervention: Promote Injury-Free Environment  Recent Flowsheet Documentation  Taken 03/28/2024 0800 by Elida CROME, RN  Safety Promotion/Fall Prevention: fall prevention program maintained

## 2024-03-28 NOTE — Respiratory Therapy (Signed)
 03/28/24 2000   Vent Assessment   Start Time 2035   Orders Updated in EMR Yes   $ BMU Checked (Resp only) S   Head of Bed (HOB) Positioning HOB elevated   Oxygen Noting/Safety Checks Bag/Mask Unit at Bedside Per Dept. Policy   Assessment Routine Check   Proximal  Temperature 35 C (95 F)   H2O Bag (VENT) Checked   HEPA/Hydrophobic Filter checked   Circuit Checked   Inline Suction Catheter Checked   $ Cuff Check (Resp only) Y   Method used MOV   Cuff Inflated   Cuff Pressure 32 cmH2O   PETCO2 34 mmHg   Airway   Airway Type ETT   Breath Sounds   L General Breath Sounds Diminished   R General Breath Sounds Diminished   EndoTracheal Tube 8.0 22;Right Lip   Intubation Date/Intubation Time: 03/27/24 1320   Reason For Intubation: Emergent  Tube Size: 8.0  Position: 22;Right  Landmark: Lip  Intubation Confirmation: Chest Xray;ETCO2;Auscultation   Airway Secure Device;Bite Block   Position Change Yes   ET tube measurement (cm) 23 cm   ET Tube Landmark at the Lip   Change Reason Routine   Physical Assessment of ETT Performed by RT   Vent Check   Equipment Hamilton C6   Mode PC   Assist Control Variable  Pressure Control   Facial Skin Integrity WNL   Inspired VT (machine) 344 mLs   Expired VT (machine) 339 mLs   ETCO2 34 mmHg   Set Rate 16 Breaths Per Minute   Total Rate 16 Breaths Per Minute   Actual Minute Volume 5.4 Liters   FiO2 55 %   MAP 11 cmH2O   Actual PEEP 6 cmH2O   Set PEEP 6 cmH2O   PC Set 22 cmH2O   PIP 28 cmH2O   Plateau Pressure 25 cmH2O   Driving Pressure (DP) (Calculated) 19 cmH20   Driving Pressure (C6 Vent Calculated) 19 cmH20   Static Compliance (Manual Calc) 21.5   I-Time 0.95 seconds   I:E Ratio 1:2.9   Inspiratory Flow 56.8 L/min   Sensitivity 3   Rise Time (Sec) 7 seconds   Vent Alarms   Audible Alarms Checked & Functioning   Hi Pressure 45 cmH2O   Lo Pressure 5 cmH2O   High Min Volume 20 Liters   Low Min Volume 2 Liters   Hi RR 40 breaths per minute   Low RR 5 breaths per minute   High Spont VT  1000 mL   Low Spont VT 100 mL   High Set VT 1000 mL   Low Set VT 100 mL   Apnea Alarm 20 seconds   Timepoint   Stop Time 2044   Vent Check Duration 9 minutes   PT on vent settings above. RT will continue to monitor.

## 2024-03-28 NOTE — Respiratory Therapy (Signed)
 Latest Reference Range & Units 03/28/24 08:14   %FIO2 % 70   PH 7.35 - 7.45  7.55 (H)   PCO2 35 - 45 mm/Hg 38   PO2 83 - 108 mm/Hg 71 (L)   BICARBONATE 21.0 - 28.0 mmol/L 32.8 (H)   BASE EXCESS 0.0 - 3.0 mmol/L 10.1 (H)   PAO2/FIO2 RATIO  101   O2 SATURATION (ARTERIAL) 94.0 - 98.0 % 96.0   SODIUM 136 - 145 mmol/L 135 (L)   LACTATE <=1.9 mmol/L 0.9   CHLORIDE 98 - 107 mmol/L 100   GLUCOSE 65 - 125 mg/dL 875   IONIZED CALCIUM 8.84 - 1.33 mmol/L 1.20   HEMOLYSIS None, Mild  None   WHOLE BLOOD K+ 3.5 - 5.1 mmol/L 3.6   (H): Data is abnormally high  (L): Data is abnormally low    Results given to Donnice Larve, APRN

## 2024-03-28 NOTE — Respiratory Therapy (Signed)
 Latest Reference Range & Units 03/28/24 21:48   %FIO2 % 55   PH 7.35 - 7.45  7.44   PCO2 35 - 45 mm/Hg 48 (H)   PO2 83 - 108 mm/Hg 93   BICARBONATE 21.0 - 28.0 mmol/L 30.5 (H)   BASE EXCESS 0.0 - 3.0 mmol/L 7.2 (H)   PAO2/FIO2 RATIO  169   O2 SATURATION (ARTERIAL) 94.0 - 98.0 % 97.5   SODIUM 136 - 145 mmol/L 134 (L)   LACTATE <=1.9 mmol/L 1.4   CHLORIDE 98 - 107 mmol/L 100   GLUCOSE 65 - 125 mg/dL 854 (H)   IONIZED CALCIUM 1.15 - 1.33 mmol/L 1.25   HEMOLYSIS None, Mild  None   WHOLE BLOOD K+ 3.5 - 5.1 mmol/L 3.2 (L)   (H): Data is abnormally high  (L): Data is abnormally low    ABG results given to Terri for review.

## 2024-03-28 NOTE — Respiratory Therapy (Signed)
 03/28/24 1355   Vent Assessment   Start Time 1355   Orders Updated in EMR Yes   Assessment Vent Change   Reason For Change Abg   Vent Check   Equipment Hamilton C6   Set Rate 16 Breaths Per Minute   Total Rate 16 Breaths Per Minute   Spontaneous Rate 0 Breaths Per Minute     Decreased pt's rate due to recent abg results, pt is resting comfortably with adequate ventilation

## 2024-03-28 NOTE — Respiratory Therapy (Addendum)
 03/28/24 0800   Vent Assessment   Start Time 0800   Orders Updated in EMR Yes   $ BMU Checked (Resp only) S   Head of Bed (HOB) Positioning HOB elevated   Oxygen Noting/Safety Checks Bag/Mask Unit Checked and Functioning Properly   Assessment Vent Change   Reason For Change Abg   MD Notified Y   Proximal  Temperature 34 C (93.2 F)   H2O Bag (VENT) Checked   HEPA/Hydrophobic Filter checked   Circuit Checked   Inline Suction Catheter Checked   $ Cuff Check (Resp only) Y   Method used PM   Cuff Inflated   Cuff Pressure 32 cmH2O   Heart Rate 81   SpO2 95 %   PETCO2 30 mmHg   Airway   Airway Type ETT   Breath Sounds   L General Breath Sounds Clear   R General Breath Sounds Clear   Throughout All Lung Fields All Fields   EndoTracheal Tube 8.0 22;Right Lip   Intubation Date/Intubation Time: 03/27/24 1320   Reason For Intubation: Emergent  Tube Size: 8.0  Position: 22;Right  Landmark: Lip  Intubation Confirmation: Chest Xray;ETCO2;Auscultation   Airway Secure Bite Block;Device   Position Change Yes;Right   ET tube measurement (cm) 24 cm   ET Tube Landmark at the Lip   Change Reason Routine   Retaped N   Physical Assessment of ETT Performed by RT   Vent Check   Equipment Hamilton C6   Mode PC   Assist Control Variable  Pressure Control   Facial Skin Integrity WNL   Inspired VT (machine) 329 mLs   Expired VT (machine) 330 mLs   Set Rate 18 Breaths Per Minute   Total Rate 18 Breaths Per Minute   Spontaneous Rate 0 Breaths Per Minute   Actual Minute Volume 6 Liters   FiO2 70 %   MAP 12 cmH2O   Actual PEEP 6 cmH2O   Set PEEP 6 cmH2O   PC Set 22 cmH2O   PIP 29 cmH2O   Plateau Pressure 26 cmH2O   Driving Pressure (DP) (Calculated) 20 cmH20   Driving Pressure (C6 Vent Calculated) 20 cmH20   Static Compliance (Manual Calc) 17   I-Time 0.95 seconds   I:E Ratio 1:2.5   Inspiratory Flow 56.2 L/min   Sensitivity 3   Vent Alarms   Audible Alarms Checked & Functioning   Hi Pressure 45 cmH2O   Lo Pressure 5 cmH2O   High Min Volume 20  Liters   Low Min Volume 2 Liters   Hi RR 40 breaths per minute   Low RR 5 breaths per minute   High Spont VT 1000 mL   Low Spont VT 100 mL   High Set VT 1000 mL   Low Set VT 100 mL   Apnea Alarm 20 seconds   Timepoint   Stop Time 0839   Vent Check Duration 39 minutes     Vent changes made per Dr. Dennise due to recent abg Results. ET tube is 24 cm @ the lip, chest x-ray ordered. ET tube was move to the right. Labs and chest x-ray reviewed. Follow up ABG ordered. PT is resting comfortably with adequate ventilation on above settings.

## 2024-03-28 NOTE — Nurses Notes (Signed)
 Remain titrating Fentanyl  for comfort.  Currently at 150 mcg/min.  Awake, eyes open and able to nod and shake head appropriately.  Also titrating Levophed  for blood pressure, currently at 0.01 mcg/kg/min.  Foley patent.  Has had one small stool.  Turned and repositioned.

## 2024-03-29 ENCOUNTER — Other Ambulatory Visit: Payer: Self-pay

## 2024-03-29 ENCOUNTER — Inpatient Hospital Stay (HOSPITAL_COMMUNITY)

## 2024-03-29 DIAGNOSIS — J9690 Respiratory failure, unspecified, unspecified whether with hypoxia or hypercapnia: Secondary | ICD-10-CM

## 2024-03-29 DIAGNOSIS — J849 Interstitial pulmonary disease, unspecified: Secondary | ICD-10-CM

## 2024-03-29 LAB — COMPREHENSIVE METABOLIC PANEL, NON-FASTING
ALBUMIN/GLOBULIN RATIO: 0.9 (ref 0.8–1.4)
ALBUMIN: 2.9 g/dL — ABNORMAL LOW (ref 3.5–5.7)
ALKALINE PHOSPHATASE: 85 U/L (ref 34–104)
ALT (SGPT): 11 U/L (ref 7–52)
ANION GAP: 9 mmol/L (ref 4–13)
AST (SGOT): 11 U/L — ABNORMAL LOW (ref 13–39)
BILIRUBIN TOTAL: 0.5 mg/dL (ref 0.3–1.0)
BUN/CREA RATIO: 31 — ABNORMAL HIGH (ref 6–22)
BUN: 16 mg/dL (ref 7–25)
CALCIUM, CORRECTED: 10.1 mg/dL (ref 8.9–10.8)
CALCIUM: 9.2 mg/dL (ref 8.6–10.3)
CHLORIDE: 100 mmol/L (ref 98–107)
CO2 TOTAL: 29 mmol/L (ref 21–31)
CREATININE: 0.51 mg/dL — ABNORMAL LOW (ref 0.60–1.30)
ESTIMATED GFR: 100 mL/min/1.73mˆ2 (ref >59)
GLOBULIN: 3.1 (ref 2.0–3.5)
GLUCOSE: 111 mg/dL — ABNORMAL HIGH (ref 74–109)
OSMOLALITY, CALCULATED: 278 mosm/kg (ref 270–290)
POTASSIUM: 3.5 mmol/L (ref 3.5–5.1)
PROTEIN TOTAL: 6 g/dL — ABNORMAL LOW (ref 6.4–8.9)
SODIUM: 138 mmol/L (ref 136–145)

## 2024-03-29 LAB — APOLIPOPROTEIN A1, SERUM: APOLIPOPROTEIN A1: 134 mg/dL (ref >=125)

## 2024-03-29 LAB — BLOOD GAS W/ LACTATE REFLEX
%FIO2 (ARTERIAL): 55 %
BASE EXCESS (ARTERIAL): 7.8 mmol/L — ABNORMAL HIGH (ref 0.0–3.0)
BICARBONATE (ARTERIAL): 30.9 mmol/L — ABNORMAL HIGH (ref 21.0–28.0)
LACTATE: 0.9 mmol/L (ref <=1.9)
O2 SATURATION (ARTERIAL): 95 % (ref 94.0–98.0)
PAO2/FIO2 RATIO: 133
PCO2 (ARTERIAL): 49 mmHg — ABNORMAL HIGH (ref 35–45)
PH (ARTERIAL): 7.44 (ref 7.35–7.45)
PO2 (ARTERIAL): 73 mmHg — ABNORMAL LOW (ref 83–108)

## 2024-03-29 LAB — URINE CULTURE,ROUTINE: URINE CULTURE: >100000 — AB

## 2024-03-29 LAB — BLOOD GAS W/ LYTES, LACTATE REFLEX
%FIO2 (ARTERIAL): 75 %
BASE DEFICIT: 8.5 mmol/L — ABNORMAL HIGH (ref 0.0–3.0)
BICARBONATE (ARTERIAL): 18.1 mmol/L — ABNORMAL LOW (ref 21.0–28.0)
CHLORIDE: 101 mmol/L (ref 98–107)
GLUCOSE: 168 mg/dL — ABNORMAL HIGH (ref 65–125)
IONIZED CALCIUM: 0.93 mmol/L — ABNORMAL LOW (ref 1.15–1.33)
LACTATE: 2.5 mmol/L — ABNORMAL HIGH (ref <=1.9)
O2 SATURATION (ARTERIAL): 91.9 % (ref 94.0–98.0)
PAO2/FIO2 RATIO: 99
PCO2 (ARTERIAL): 42 mmHg (ref 35–45)
PH (ARTERIAL): 7.25 — ABNORMAL LOW (ref 7.35–7.45)
PO2 (ARTERIAL): 74 mmHg — ABNORMAL LOW (ref 83–108)
SODIUM: 131 mmol/L — ABNORMAL LOW (ref 136–145)
WHOLE BLOOD POTASSIUM: 6.3 mmol/L (ref 3.5–5.1)

## 2024-03-29 LAB — CBC
HCT: 32 % (ref 31.2–41.9)
HGB: 10.8 g/dL — ABNORMAL LOW (ref 10.9–14.3)
MCH: 29.3 pg (ref 24.7–32.8)
MCHC: 33.6 g/dL (ref 32.3–35.6)
MCV: 87.1 fL (ref 75.5–95.3)
MPV: 6.6 fL — ABNORMAL LOW (ref 7.9–10.8)
PLATELETS: 332 x10ˆ3/uL (ref 140–440)
RBC: 3.68 x10ˆ6/uL (ref 3.63–4.92)
RDW: 18.9 % — ABNORMAL HIGH (ref 12.3–17.7)
WBC: 15.5 x10ˆ3/uL — ABNORMAL HIGH (ref 3.8–11.8)

## 2024-03-29 LAB — ASPERGILLUS (GALACTOMANNAN) ANTIGEN, SERUM
ASPERGILLUS AG, EIA, SER: NOT DETECTED
INDEX VALUE: 0.09 (ref <0.50)

## 2024-03-29 LAB — MRSA SCREEN: MRSA: NO GROWTH

## 2024-03-29 LAB — MAGNESIUM: MAGNESIUM: 1.9 mg/dL (ref 1.9–2.7)

## 2024-03-29 LAB — ANA SCREEN BY IFA WITH REFLEX TO TITER/PATTERN AND SEROLOGY CASCADE, SERUM: ANA INTERPRETATION: NEGATIVE

## 2024-03-29 MED ORDER — METHYLNALTREXONE 12 MG/0.6 ML SUBCUTANEOUS SOLUTION
12.0000 mg | SUBCUTANEOUS | Status: DC | PRN
  Filled 2024-03-29: qty 0.6

## 2024-03-29 MED ORDER — PROPOFOL 10 MG/ML INTRAVENOUS EMULSION
0.0000 ug/kg/min | INTRAVENOUS | Status: DC
  Administered 2024-03-29: 20 ug/kg/min via INTRAVENOUS
  Administered 2024-03-29: 15 ug/kg/min via INTRAVENOUS
  Administered 2024-03-29: 10 ug/kg/min via INTRAVENOUS
  Administered 2024-03-29 (×2): 20 ug/kg/min via INTRAVENOUS
  Administered 2024-03-30 (×2): 30 ug/kg/min via INTRAVENOUS
  Administered 2024-03-30 (×2): 20 ug/kg/min via INTRAVENOUS
  Administered 2024-03-30: 25 ug/kg/min via INTRAVENOUS
  Administered 2024-03-30: 20 ug/kg/min via INTRAVENOUS
  Filled 2024-03-29 (×4): qty 100

## 2024-03-29 MED ORDER — IPRATROPIUM 0.5 MG-ALBUTEROL 3 MG (2.5 MG BASE)/3 ML NEBULIZATION SOLN
3.0000 mL | INHALATION_SOLUTION | Freq: Four times a day (QID) | RESPIRATORY_TRACT | Status: DC | PRN
  Administered 2024-03-30: 3 mL via RESPIRATORY_TRACT

## 2024-03-29 MED ORDER — FUROSEMIDE 10 MG/ML INJECTION SOLUTION
20.0000 mg | Freq: Two times a day (BID) | INTRAMUSCULAR | Status: DC
  Administered 2024-03-29 – 2024-03-30 (×3): 20 mg via INTRAVENOUS
  Filled 2024-03-29 (×3): qty 4

## 2024-03-29 MED ORDER — POTASSIUM CHLORIDE 20 MEQ/15 ML ORAL LIQUID
40.0000 meq | Freq: Once | ORAL | Status: AC
  Administered 2024-03-29: 40 meq via GASTROSTOMY
  Filled 2024-03-29: qty 30

## 2024-03-29 NOTE — Respiratory Therapy (Signed)
 As discussed at am rounds with Dr Georgean, continue to manage patient per protocol with goal for extubation. Briefly attempted to place patient into weaning parameters in early shift while awake and following commands but patient desaturated to less than 87% in less than 30 seconds. Returned to previous settings with some titration as noted on flowsheets.    Last ABG reviewed, Chest Xray reviewed (mildly increased infiltrates/vascular congestion). Admission dx aspiration, acute respiratory failure, ground glass opacity present on imaging of lung (12/26), bipolar, anxiety disorder.    Sedition increased due to patient while alert was attempting to pull ETT. Will continue to monitor and manage vent per protocol, as patient becomes more medically stable will attempt weaning again.

## 2024-03-29 NOTE — Nurses Notes (Signed)
 Receiving Fentanyl  and and Precedex  for sedation.  Tolerating ventilator,.  [place] sump to low intermittent suction with green drainage.  Vomiting small amounts of emesis when turned.  Bed bath given.  Foley patent.  Urine output improved.  Able to nod and shake head appropriately when asked questions.  Turned and repositioned.

## 2024-03-29 NOTE — Progress Notes (Signed)
 [facility]   Critical Care Progress Note    LAILANIE HASLEY  Date of service: 03/29/2024  Date of Admission:  03/27/2024  Hospital Day:  LOS: 2 days     Summary:  73 yo M with PMH depression, anxiety w no pulm Hx and recent admission 11/14 w ?pneumonitis treated w antibiotics and steroids, DC to [facility] pw worsening SOB. CTA neg for PE but continued to show worsening diffuse patchy GGOs w possible trapping and peripheral consolidations. Intubated in ER. S/p Bronch and BAL RLL, negative for DAH.     Subjective/Objective: Intubated, awake, following commands    Review of Systems:    As per above    Vital signs: BP 116/73   Pulse 67   Temp (!) 35.7 C (96.3 F)   Resp 15   Ht 1.549 m (5' 1)   Wt 88.2 kg (194 lb 7.1 oz)   SpO2 94%   BMI 36.74 kg/m         Vent Settings:  Vent Check  Equipment: Hamilton C6  Circuit Compliance Status/TRC: Off  Mode: 2 (PCV+)  Assist Control Variable : Pressure Control  Facial Skin Integrity: WNL  Inspired VT (machine): 368 mLs  Expired VT (machine): 336 mLs  ETCO2: 39 mmHg  Set Rate: 14 Breaths Per Minute (titrate per protocol.)  Total Rate: 22 Breaths Per Minute  Spontaneous Rate: 0 Breaths Per Minute  Actual Minute Volume: 6 Liters  FiO2: 55 % (titrate per protocol.)  MAP: 10 cmH2O  IPAP: 14 cmH20  Actual PEEP: 8 cmH2O (titrate per protocol)  Set PEEP: 8 cmH2O  PC Set: 17 cmH2O (titrate per protocol)  PIP: 27 cmH2O  Plateau Pressure: 14 cmH2O  Driving Pressure (DP) (Calculated): 6 cmH20  Driving Pressure (C6 Vent Calculated): 18 cmH20  Static Compliance (Manual Calc): 17.5  I-Time: 3 seconds  I:E Ratio: 1:1.4  Inspiratory Flow: 57 L/min  Sensitivity: 3  Rise Time (Sec): 7 seconds    I/O:  I/O last 24 hours:    Intake/Output Summary (Last 24 hours) at 03/29/2024 1259  Last data filed at 03/29/2024 1236  Gross per 24 hour   Intake 660.53 ml   Output 2045 ml   Net -1384.47 ml       Exam:  Constitutional: Intubated, awake, following  commands  Eyes: Pupils are equal, round, and reactive to light.   Cardiovascular: S1, S2, Normal rate, regular rhythm and normal heart sounds. No murmur heard.  Pulmonary/Chest: Breath sounds decreased b/l lower lungs. No respiratory distress. no wheezes.  no rales.   Abdominal: Soft. Bowel sounds are normal.  no distension. no abdominal tenderness.   Neurological: no gross focal deficit, intubated       alcohol  62 % (NOZIN NASAL SANITIZER) nasal swab packet, 1 Each, Each Nostril, 2x/day  ALPRAZolam  (XANAX ) tablet, 1 mg, Oral, 2x/day  aspirin  chewable tablet 81 mg, 81 mg, Oral, Daily  atorvastatin  (LIPITOR) tablet, 80 mg, Oral, QPM  budesonide  (PULMICORT  RESPULES) 0.5 mg/2 mL nebulizer suspension, 1 mg, Nebulization, 2x/day  cholecalciferol  (VITAMIN D3) 1000 unit (25 mcg) tablet, 2,000 Units, Oral, Daily  NS 250 mL flush bag, , Intravenous, Q15 Min PRN   And  D5W 250 mL flush bag, , Intravenous, Q15 Min PRN  NS 250 mL flush bag, , Intravenous, Q15 Min PRN   And  D5W 250 mL flush bag, , Intravenous, Q15 Min PRN  dexmedeTOMIDine  (PRECEDEX ) 1,000 mcg in NS 250 mL (tot vol)  infusion, 0-1.5 mcg/kg/hr (Adjusted), Intravenous, Continuous  dexmedeTOMIDine  (PRECEDEX ) 200 mcg in NS 50mL infusion, 0-1.5 mcg/kg/hr (Adjusted), Intravenous, Continuous  enoxaparin  PF (LOVENOX ) 40 mg/0.4 mL SubQ injection, 40 mg, Subcutaneous, Q24H  FLUoxetine  (PROzac ) capsule, 20 mg, Oral, Daily  furosemide  (LASIX ) 10 mg/mL injection, 20 mg, Intravenous, Q12H  ipratropium-albuterol  0.5 mg-3 mg(2.5 mg base)/3 mL Solution for Nebulization, 3 mL, Nebulization, 4x/day  lactulose  (ENULOSE ) 10g per 15mL oral liquid, 30 mL, Oral, Daily  levoFLOXacin  (LEVAQUIN ) 750 mg in D5W 150 mL premix IVPB, 750 mg, Intravenous, Q24H  methylnaltrexone  (RELISTOR ) 12 mg/0.6 mL injection, 12 mg, Subcutaneous, Q48H PRN  methylPREDNISolone  sod succ (SOLU-medrol ) 125 mg/2 mL injection, 250 mg, Intravenous, Q12H  midazolam  (VERSED ) 5 mg/mL injection, 2 mg, Intravenous,  Once  norepinephrine  (LEVOPHED ) 16 mg in NS 250mL premix infusion, 0-0.3 mcg/kg/min, Intravenous, Continuous  NS flush syringe, 3 mL, Intracatheter, Q8HRS  NS flush syringe, 3 mL, Intracatheter, Q1H PRN  NS flush syringe, 3 mL, Intracatheter, Q8HRS  NS flush syringe, 3 mL, Intracatheter, Q1H PRN  pantoprazole  (PROTONIX ) 4 mg/mL injection, 40 mg, Intravenous, Daily  polyethylene glycol (MIRALAX ) oral packet, 17 g, Oral, Daily  propofol  (DIPRIVAN ) 10 mg/mL premix infusion, 0-50 mcg/kg/min (Adjusted), Intravenous, Continuous  sennosides-docusate sodium  (SENOKOT-S) 8.6-50mg  per tablet, 1 Tablet, Oral, 2x/day  sodium chloride  (0.9% SALINE) nebulizer solution, 3 mL, Nebulization, 2x/day  tamsulosin  (FLOMAX ) capsule, 0.4 mg, Oral, Daily after Dinner  trimethoprim -sulfamethoxazole  (BACTRIM  DS) 160-800mg  per tablet, 2 Tablet, Oral, Q8H        Labs:  CBC   Recent Labs     03/27/24  1901 03/28/24  0513 03/29/24  0536   WBC 10.6 10.6 15.5*   HGB 11.2 10.7* 10.8*   HCT 33.1 31.2 32.0   PLTCNT 284 283 332      Chemistries   Recent Labs     03/27/24  1114 03/27/24  1736 03/27/24  1925 03/27/24  1959 03/28/24  0513 03/28/24  0814 03/28/24  2148 03/29/24  0536   SODIUM 133*  137   < > 136   < > 139 135* 134* 138   POTASSIUM 3.5  --  4.1  --  3.8  --   --  3.5   CHLORIDE 98  98   < > 98   < > 99 100 100 100   CO2 28  --  32*  --  30  --   --  29   BUN 7  --  11  --  13  --   --  16   CREATININE 0.51*  --  0.47*  --  0.37*  --   --  0.51*   CALCIUM 8.8  --  8.5*  --  9.0  --   --  9.2   ALBUMIN  3.3*  --   --   --  2.8*  --   --  2.9*   MAGNESIUM   --   --   --   --  1.5*  --   --  1.9   PHOSPHORUS  --   --   --   --  3.5*  --   --   --     < > = values in this interval not displayed.      Liver Enzymes   Recent Labs     03/27/24  1114 03/28/24  0513 03/29/24  0536   TOTALPROTEIN 6.6 5.9* 6.0*   ALBUMIN  3.3* 2.8* 2.9*   AST 22 15 11*   ALT  17 13 11    ALKPHOS 92 80 85      Inflammatory Markers   Recent Labs     03/27/24  1114  03/27/24  1245 03/27/24  1325 03/27/24  1434 03/27/24  1631 03/27/24  2026   CRP 22.1*  --   --   --   --   --    LACTICACID 2.2 1.8 2.3* 2.5* 1.9 1.0       Arterial Blood Gases   Recent Labs     03/28/24  1336 03/28/24  2148 03/29/24  0918   PH 7.45 7.44 7.44   PCO2 47* 48* 49*   PO2 98 93 73*   BICARBONATE 30.8* 30.5* 30.9*   BASEEXCESS 7.5* 7.2* 7.8*      Cardiac & Coags   Recent Labs     03/27/24  1114 03/27/24  1245   BNP  --  63   INR 1.30*  --       Lipid Panel   No results for input(s): HDL, CHOL in the last 72 hours.    Invalid input(s): LDL     Urinalysis:   Recent Labs     03/27/24  1217 03/27/24  1240 03/28/24  2148 03/29/24  0918   LEUKOCYTES 500*  --   --   --    NITRITE Negative  --   --   --    UROBILINOGEN Normal  --   --   --    PROTEIN Negative  --   --   --    PH  --    < > 7.44 7.44   BILIRUBIN Negative  --   --   --    GLUCOSE  --    < > 145*  --     < > = values in this interval not displayed.        Radiology:  Results for orders placed or performed during the hospital encounter of 03/27/24 (from the past 24 hours)   US  KIDNEY WITH BLADDER     Status: None    Narrative    Tama K Lacomb    RADIOLOGIST: Justin S Torok    US  KIDNEY WITH BLADDER performed on 03/28/2024 2:47 PM    CLINICAL HISTORY: Hx of L hydronephrosis, currently low U output r/o worsening hydro.  HX HYDRO, LOW URINE OUTPUT    TECHNIQUE: Ultrasound of the kidneys and bladder    COMPARISON:  Ultrasound last month also upper abdomen on chest CTA from several days ago    FINDINGS:    Imaging is best obtainable given patient habitus and poor acoustic penetration.    Kidneys:       The right kidney measures 10.8 cm x 6.2 cm x 5.0 cm.       Volume: 174 mL.       The left kidney measures 10.4 cm x 6.5 cm x 5.6 cm.       Volume: 199 mL.    Probable mild cortical thinning of both kidneys   No definite hydronephrosis.    No cysts or large solid renal masses.    Bladder:   Decompressed by Foley catheter and unable to be accurately  assessed       Impression    No significant hydronephrosis. Best obtainable study          Radiologist location ID: [id number]     XR KUB     Status: None    Narrative  Kataya K Maack    RADIOLOGIST: Arley Blow, MD    XR KUB performed on 03/28/2024 3:31 PM.    CLINICAL HISTORY: High gastric residuals, vomiting   High gastric residuals, vomiting    TECHNIQUE: Single view abdomen.    COMPARISON:  Prior exam from today    FINDINGS:  There is an NG tube with the tip at the EG junction. There is increased gaseous gastric distention since the prior exam    The kidneys are obscured. No renal calculi are identified    There is left lower dorsal/upper lumbar scoliosis with right lower lumbar scoliosis. There is upper lower lumbar disc narrowing and spurring  There is retained stool in the visualized colon         Impression    Increased gaseous gastric distention since the prior exam. The tip of the NG tube is at the EG junction        Radiologist location ID: [id number]     XR AP MOBILE CHEST     Status: None    Narrative    Hafsa K Grattan    RADIOLOGIST: Arley Blow, MD    XR AP MOBILE CHEST performed on 03/29/2024 9:19 AM    CLINICAL HISTORY: respiratory failure  respiratory failure    TECHNIQUE: Frontal view of the chest.    COMPARISON:  Yesterday    FINDINGS:  ET tube 4.1 cm above the carina. There is an NG tube with the tip at the EG junction, with gaseous gastric distention   Heart size is mildly enlarged.     There are bilateral perihilar infiltrates slightly worse since the prior exam           Impression    Mildly increased infiltrates/vascular congestion        Radiologist location ID: [id number]               Assessment & Plan:  Active Hospital Problems    Diagnosis    Primary Problem: Acute on chronic respiratory failure with hypoxemia (CMS HCC)    Ground glass opacity present on imaging of lung    Pneumonitis, interstitial    GAD (generalized anxiety disorder)    Bipolar 1 disorder (CMS HCC)     Summary:  73  yo M with PMH depression, anxiety, spondylolisthesis lumbar spine w no pulm Hx and Recent hospitalization on 11/14 for respiratory failure requiring high-flow nasal cannula oxygen, high peripheral eosinophil count, with negative infectious workup, 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  Hence on steroid long taper with some clinical improvement and eventual weaning oxygen down to low-flow, DC to [facility] pw worsening SOB. CTA neg for PE but continued to show worsening diffuse patchy GGOs w possible trapping and peripheral consolidations. Intubated in ER. S/p Bronch and BAL RLL, negative for DAH.     # Acute hypoxemic respiratory failure in setting of diffuse parenchymal lung disease with ARDS physiology  # Extensive bilateral GGOs   # Anxiety and depression  # Hx of moderate L hydronephrosis  # ileus  # Hypomagnesemia    CTA this admission negative for PE.  Shows extensive ground-glass opacities with a evidence of air trapping and traction bronchiectasis and some peripheral consolidation involving all lobes.  In comparison to prior admission CT it is worse, tracking back to September 20, 2023 CT there has been subpleural reticular opacities present.    No PFTs on file.  Nonsmoker.  No known  antigen exposure Accept cats at home.  Autoimmune workup so far unremarkable including ANA/anti CCP/RF and complement level.  Follow up on ANCA panel and anti GBM.  Has been on steroid therapy, LDH high, PJP remains in differential, pending beta D glucan and PJP bronchoalveolar lavage, for now on Bactrim  therapeutic dose.  Through serial bronchoalveolar lavage diffuse alveolar hemorrhage has been ruled out.    Bronchoalveolar lavage cellular pattern we will certainly clarify the picture more, CEP/NSIP/HP/COP remains in differential.    Empiric coverage with Levaquin .  Pulse dose steroid D3, after which we will lower the dose and aim for prolonged  taper.  PF ratio acceptable, remains on 8 of PEEP 55% FiO2, keep the driving pressure is below 15.    Could not tolerate tube feeds concern for ileus, fentanyl  drip has been discontinued.  Bowel regimen has been added.  X-ray KUB.  Propofol  for sedation  Starting diuresis today.  Prior 2D echo preserved LV function, no gross valvular pathologies.  Scheduled nebs  Resume aspirin /statin.    GI prophylaxis PPI    deep vein thrombosis prophylaxis Lovenox      Disposition medical ICU         Deatrice Barman, MD  Intensivist

## 2024-03-29 NOTE — Consults (Signed)
 COpAT/ID eConsult Follow Up Note    Pertinent Clinical History:   47F admitted PRN ICU 12/26 for shortness of breath and hypoxic respiratory failure.  Intubated and underwent bronch for pulmonary infiltrates.  Afebrile.  Recent PRN admission 11/14 to 11/24 for pneumonitis and treated with steroids.  Was at [facility] till 03/23/24 on Prednisolone 30 mg daily (with tapering schedule).  PMHx:   BMI 35, PUD    Antimicrobials:  Levofloxacin /High dose Bactrim   Antimicrobial Allergies:  Cefaclor    Interim Updates:  Remains intubated in ICU  A line, Foley, OG tube  On high dose steroids    Labs, Micro, and Imaging Reviewed:    W 15.5, Hb 10.8, Plt 332  B/Cr 16/0.5, AST/ALT 11/11, LDH 596  HIV, HepB, ANCA, Galactomannan negative.  Fungitell pending  CRP 22.1  ABG on 55% 7.44/49/73    12/26 Blood cultures negative  12/26 Urine S.pneumo/Legionella negative, Culture mixed flora  12/26 Bronch cultures negative.  PJP PCR and Cytopath pending    12/25 Vs 11/14 CT Chest with worse, extensive bilateral infiltrates.          Assessment/Recommendations:   47F with no prior known pulmonary issues readmitted with bilateral pulmonary process after significant recent steroid exposure - unclear if progression of prior illness Vs new opportunistic infection.  Now S/P bronch    Continue Levofloxacin  and high dose Bactrim   If Fungitell/Bronch PJP PCR negative may DC Bactrim   Will follow    I spent 5 minutes or more reviewing the patient's medical record, lab/micro/imaging studies, and/or medications.  Communicated with provider through Secure Chat/In [employer].  Total time >21 min.       Kelechi Astarita R. Elige, MD  Professor, Infectious Diseases  Department of Medicine, [place]  [place]

## 2024-03-29 NOTE — Respiratory Therapy (Signed)
 03/29/24 2000   Vent Assessment   Start Time 2000   Head of Bed (HOB) Positioning HOB at 30-45 degrees   Oxygen Noting/Safety Checks Bag/Mask Unit at Bedside Per Dept. Policy;Patient in No Apparent Respiratory Distress   Assessment Routine Check   Proximal  Temperature 35 C (95 F)   H2O Bag (VENT) Checked   HEPA/Hydrophobic Filter checked   Circuit Checked   Inline Suction Catheter Checked   $ Cuff Check (Resp only) Y   Method used PM   Cuff Inflated   Cuff Pressure 29 cmH2O   Airway   Airway Type ETT   Breath Sounds   L General Breath Sounds Diminished   R General Breath Sounds Diminished   EndoTracheal Tube 8.0 22;Right Lip   Intubation Date/Intubation Time: 03/27/24 1320   Reason For Intubation: Emergent  Tube Size: 8.0  Position: 22;Right  Landmark: Lip  Intubation Confirmation: Chest Xray;ETCO2;Auscultation   Airway Secure Device   Position Change Yes   ET tube measurement (cm) 23 cm   ET Tube Landmark at the Lip   Change Reason Routine   Physical Assessment of ETT Performed by RT   Suction   Suction Method in-line suction catheter (closed)   Respiratory Secretions Assessment   Secretions Amount moderate   Secretions Color white   Secretions Characteristics thick   Vent Check   Equipment Hamilton C6   Mode PC   Facial Skin Integrity WNL   Inspired VT (machine) 373 mLs   Expired VT (machine) 380 mLs   Set Rate 14 Breaths Per Minute   Total Rate 21 Breaths Per Minute   Actual Minute Volume 7.6 Liters   FiO2 55 %   MAP 13 cmH2O   Actual PEEP 8 cmH2O   Set PEEP 8 cmH2O   PC Set 17 cmH2O   PIP 26 cmH2O   Plateau Pressure 24 cmH2O   Driving Pressure (DP) (Calculated) 16 cmH20   Static Compliance (Manual Calc) 21   I-Time 0.75 seconds   I:E Ratio 1:2.3   Inspiratory Flow 59.4 L/min   Vent Alarms   Audible Alarms Checked & Functioning   Hi Pressure 45 cmH2O   Lo Pressure 5 cmH2O   High Min Volume 20 Liters   Low Min Volume 2 Liters   Hi RR 40 breaths per minute   Low RR 5 breaths per minute   High Spont VT 1000 mL    Low Spont VT 100 mL   Apnea Alarm 20 seconds   $Ventilator Charges$   $ Vent Charge Capture Vent ICU-Sub   Timepoint   Stop Time 2030   Vent Check Duration 30 minutes   Static compliance low and PIP high at this time. Will contact provider for prn treatments. Pt suctioned and ETT position changed. Continuing to monitor.

## 2024-03-29 NOTE — Care Plan (Signed)
 Problem: Adult Inpatient Plan of Care  Goal: Absence of Hospital-Acquired Illness or Injury  Outcome: Ongoing (see interventions/notes)  Intervention: Identify and Manage Fall Risk  Recent Flowsheet Documentation  Taken 03/29/2024 1600 by Elida CROME, RN  Safety Promotion/Fall Prevention: fall prevention program maintained  Taken 03/29/2024 1200 by Elida CROME, RN  Safety Promotion/Fall Prevention: fall prevention program maintained  Taken 03/29/2024 0734 by Elida CROME, RN  Safety Promotion/Fall Prevention: fall prevention program maintained  Intervention: Prevent Skin Injury  Recent Flowsheet Documentation  Taken 03/29/2024 1800 by Elida CROME, RN  Body Position:   supine, head elevated   heels elevated off mattress  Taken 03/29/2024 1600 by Elida CROME, RN  Body Position:   supine, head elevated   heels elevated off mattress  Taken 03/29/2024 1400 by Elida CROME, RN  Body Position:   supine, head elevated   heels elevated off mattress  Taken 03/29/2024 1200 by Elida CROME, RN  Body Position:   supine, head elevated   heels elevated off mattress  Taken 03/29/2024 1100 by Elida CROME, RN  Skin Protection:   adhesive use limited   incontinence pads utilized   preventative decubiti skin protection foam dressing applied/intact   tubing/devices free from skin contact  Taken 03/29/2024 1000 by Elida CROME, RN  Body Position:   supine, head elevated   heels elevated off mattress  Taken 03/29/2024 0800 by Elida CROME, RN  Body Position:   supine, head elevated   heels elevated off mattress  Goal: Optimal Comfort and Wellbeing  Outcome: Ongoing (see interventions/notes)  Goal: Rounds/Family Conference  Outcome: Ongoing (see interventions/notes)     Problem: Skin Injury Risk Increased  Goal: Skin Health and Integrity  Outcome: Ongoing (see interventions/notes)  Intervention: Optimize Skin Protection  Recent Flowsheet Documentation  Taken 03/29/2024 1600 by Elida CROME, RN  Pressure Reduction Techniques:   Heels elevated off of the bed    Patient turned q 2 hours  Pressure Reduction Devices: Specialty bed/mattress utilized  Taken 03/29/2024 1200 by Elida CROME, RN  Pressure Reduction Techniques:   Heels elevated off of the bed   Patient turned q 2 hours  Pressure Reduction Devices: Specialty bed/mattress utilized  Taken 03/29/2024 1100 by Elida CROME, RN  Pressure Reduction Techniques:   Heels elevated off of the bed   Patient turned q 2 hours  Pressure Reduction Devices: Specialty bed/mattress utilized  Skin Protection:   adhesive use limited   incontinence pads utilized   preventative decubiti skin protection foam dressing applied/intact   tubing/devices free from skin contact  Taken 03/29/2024 0734 by Elida CROME, RN  Pressure Reduction Techniques:   Heels elevated off of the bed   Patient turned q 2 hours  Pressure Reduction Devices: Specialty bed/mattress utilized     Problem: Journalist, Newspaper Impaired  Goal: Optimal Gas Exchange  Outcome: Ongoing (see interventions/notes)     Problem: Mechanical Ventilation Invasive  Goal: Effective Communication  Outcome: Ongoing (see interventions/notes)  Goal: Optimal Device Function  Outcome: Ongoing (see interventions/notes)  Intervention: Optimize Device Care and Function  Recent Flowsheet Documentation  Taken 03/29/2024 1600 by Elida CROME, RN  Airway Safety Measures:   mask valve resuscitator at bedside   manual resuscitator/mask at bedside   oxygen flowmeter at bedside   suction at bedside  Taken 03/29/2024 1200 by Elida CROME, RN  Airway Safety Measures:   mask valve resuscitator at bedside   manual resuscitator/mask at bedside   oxygen flowmeter at bedside   suction at bedside  Taken 03/29/2024 0734 by Elida CROME, RN  Airway Safety Measures:   mask valve resuscitator at bedside   manual resuscitator/mask at bedside   oxygen flowmeter at bedside   suction at bedside  Goal: Mechanical Ventilation Liberation  Outcome: Ongoing (see interventions/notes)  Goal: Optimal Nutrition Delivery  Outcome: Ongoing (see  interventions/notes)  Goal: Absence of Device-Related Skin and Tissue Injury  Outcome: Ongoing (see interventions/notes)  Goal: Absence of Ventilator-Induced Lung Injury  Outcome: Ongoing (see interventions/notes)     Problem: Fall Injury Risk  Goal: Absence of Fall and Fall-Related Injury  Outcome: Ongoing (see interventions/notes)  Intervention: Promote Scientist, Clinical (histocompatibility And Immunogenetics) Documentation  Taken 03/29/2024 1600 by Elida CROME, RN  Safety Promotion/Fall Prevention: fall prevention program maintained  Taken 03/29/2024 1200 by Elida CROME, RN  Safety Promotion/Fall Prevention: fall prevention program maintained  Taken 03/29/2024 0734 by Elida CROME, RN  Safety Promotion/Fall Prevention: fall prevention program maintained

## 2024-03-29 NOTE — Respiratory Therapy (Signed)
 03/29/24 0200   Vent Assessment   Start Time 0200   Orders Updated in EMR Yes   $ BMU Checked (Resp only) S   Head of Bed (HOB) Positioning HOB elevated   Oxygen Noting/Safety Checks Bag/Mask Unit at Bedside Per Dept. Policy   Assessment Routine Check   Proximal  Temperature 35 C (95 F)   H2O Bag (VENT) Checked   HEPA/Hydrophobic Filter checked   Circuit Checked   Inline Suction Catheter Checked   $ Cuff Check (Resp only) Y   Method used MOV   Cuff Inflated   Cuff Pressure 32 cmH2O   Heart Rate 69   SpO2 95 %   PETCO2 34 mmHg   Airway   Airway Type ETT   Breath Sounds   L General Breath Sounds Diminished   R General Breath Sounds Diminished   EndoTracheal Tube 8.0 22;Right Lip   Intubation Date/Intubation Time: 03/27/24 1320   Reason For Intubation: Emergent  Tube Size: 8.0  Position: 22;Right  Landmark: Lip  Intubation Confirmation: Chest Xray;ETCO2;Auscultation   Airway Secure Device;Bite Block   Position Change No   ET tube measurement (cm) 23 cm   ET Tube Landmark at the Lip   Physical Assessment of ETT Performed by RT   Vent Check   Equipment Hamilton C6   Mode PC   Assist Control Variable  Pressure Control   Facial Skin Integrity WNL   Inspired VT (machine) 368 mLs   Expired VT (machine) 341 mLs   ETCO2 34 mmHg   Set Rate 16 Breaths Per Minute   Total Rate 16 Breaths Per Minute   Actual Minute Volume 5.4 Liters   FiO2 45 %   MAP 11 cmH2O   Actual PEEP 6 cmH2O   Set PEEP 6 cmH2O   PC Set 22 cmH2O   PIP 28 cmH2O   Plateau Pressure 24 cmH2O   Driving Pressure (DP) (Calculated) 18 cmH20   Driving Pressure (C6 Vent Calculated) 18 cmH20   Static Compliance (Manual Calc) 19.9   I-Time 0.95 seconds   I:E Ratio 1:2.9   Inspiratory Flow 61.3 L/min   Sensitivity 3   Rise Time (Sec) 7 seconds   Vent Alarms   Audible Alarms Checked & Functioning   Hi Pressure 45 cmH2O   Lo Pressure 5 cmH2O   High Min Volume 20 Liters   Low Min Volume 2 Liters   Hi RR 40 breaths per minute   Low RR 5 breaths per minute   High Spont VT  1000 mL   Low Spont VT 100 mL   High Set VT 1000 mL   Low Set VT 100 mL   Apnea Alarm 20 seconds   $Ventilator Charges$   $ Vent Charge Capture Vent ICU-Sub   Timepoint   Stop Time 0210   Vent Check Duration 10 minutes   PT on vent settings above. RT will continue to monitor.

## 2024-03-29 NOTE — Respiratory Therapy (Signed)
 Latest Reference Range & Units 03/29/24 09:18   %FIO2 % 55   PH 7.35 - 7.45  7.44   PCO2 35 - 45 mm/Hg 49 (H)   PO2 83 - 108 mm/Hg 73 (L)   BICARBONATE 21.0 - 28.0 mmol/L 30.9 (H)   BASE EXCESS 0.0 - 3.0 mmol/L 7.8 (H)   PAO2/FIO2 RATIO  133   O2 SATURATION (ARTERIAL) 94.0 - 98.0 % 95.0   LACTATE <=1.9 mmol/L 0.9   (H): Data is abnormally high  (L): Data is abnormally low  Results to Dr Lovell, CHRISTELLA. Jerel NP.

## 2024-03-29 NOTE — Nurses Notes (Signed)
 Tolerating ventilator.  Suctioning moderate thick white from ET tube.  [place] sump remains to low intermittent suction.  Remains with Propofol  and Precedex  gtt for sedation.  Remains drowsy but opens eyes and nods head appropriately.  Foley patent with good urinary output.  Turned and repositioned.

## 2024-03-30 ENCOUNTER — Inpatient Hospital Stay (HOSPITAL_COMMUNITY)

## 2024-03-30 DIAGNOSIS — Z751 Person awaiting admission to adequate facility elsewhere: Secondary | ICD-10-CM

## 2024-03-30 DIAGNOSIS — E669 Obesity, unspecified: Secondary | ICD-10-CM

## 2024-03-30 DIAGNOSIS — K3189 Other diseases of stomach and duodenum: Secondary | ICD-10-CM

## 2024-03-30 DIAGNOSIS — R918 Other nonspecific abnormal finding of lung field: Secondary | ICD-10-CM

## 2024-03-30 DIAGNOSIS — E785 Hyperlipidemia, unspecified: Secondary | ICD-10-CM

## 2024-03-30 DIAGNOSIS — I110 Hypertensive heart disease with heart failure: Secondary | ICD-10-CM

## 2024-03-30 LAB — CELL COUNT AND DIFF, FLUID, OTHER
LYMPHOCYTES, %: 5 %
MONOCYTE/MACROPHAGE %: 4 %
NEUTROPHILS, %: 90 %
TOTAL NUCLEATED CELL CT: 2256 {cells}/uL

## 2024-03-30 LAB — BLOOD GAS W/ CO-OX, LYTES, LACTATE REFLEX
%FIO2 (ARTERIAL): 55 %
BASE EXCESS (ARTERIAL): 8.7 mmol/L — ABNORMAL HIGH (ref 0.0–3.0)
BICARBONATE (ARTERIAL): 31.5 mmol/L — ABNORMAL HIGH (ref 21.0–28.0)
CARBOXYHEMOGLOBIN: 1.8 % (ref <=3.0)
CHLORIDE: 95 mmol/L — ABNORMAL LOW (ref 98–107)
GLUCOSE: 132 mg/dL — ABNORMAL HIGH (ref 65–125)
HEMATOCRITRT: 56 % — ABNORMAL HIGH (ref 37–50)
HEMOGLOBIN: 18.5 g/dL — ABNORMAL HIGH (ref 12.0–18.0)
IONIZED CALCIUM: 1.18 mmol/L (ref 1.15–1.33)
LACTATE: 1.2 mmol/L (ref <=1.9)
MET-HEMOGLOBIN: 0.8 % (ref <=1.5)
O2 SATURATION (ARTERIAL): 95.5 % (ref 94.0–98.0)
O2CT: 24.1 %
OXYHEMOGLOBIN: 92.9 % (ref 90.0–95.0)
PAO2/FIO2 RATIO: 136
PCO2 (ARTERIAL): 50 mmHg — ABNORMAL HIGH (ref 35–45)
PH (ARTERIAL): 7.45 (ref 7.35–7.45)
PO2 (ARTERIAL): 75 mmHg — ABNORMAL LOW (ref 83–108)
SODIUM: 131 mmol/L — ABNORMAL LOW (ref 136–145)
WHOLE BLOOD POTASSIUM: 3.3 mmol/L — ABNORMAL LOW (ref 3.5–5.1)

## 2024-03-30 LAB — COMPREHENSIVE METABOLIC PANEL, NON-FASTING
ALBUMIN/GLOBULIN RATIO: 0.9 (ref 0.8–1.4)
ALBUMIN: 2.9 g/dL — ABNORMAL LOW (ref 3.5–5.7)
ALKALINE PHOSPHATASE: 79 U/L (ref 34–104)
ALT (SGPT): 12 U/L (ref 7–52)
ANION GAP: 8 mmol/L (ref 4–13)
AST (SGOT): 12 U/L — ABNORMAL LOW (ref 13–39)
BILIRUBIN TOTAL: 0.6 mg/dL (ref 0.3–1.0)
BUN/CREA RATIO: 27 — ABNORMAL HIGH (ref 6–22)
BUN: 17 mg/dL (ref 7–25)
CALCIUM, CORRECTED: 9.8 mg/dL (ref 8.9–10.8)
CALCIUM: 8.9 mg/dL (ref 8.6–10.3)
CHLORIDE: 99 mmol/L (ref 98–107)
CO2 TOTAL: 32 mmol/L — ABNORMAL HIGH (ref 21–31)
CREATININE: 0.62 mg/dL (ref 0.60–1.30)
ESTIMATED GFR: 95 mL/min/1.73mˆ2 (ref >59)
GLOBULIN: 3.3 (ref 2.0–3.5)
GLUCOSE: 100 mg/dL (ref 74–109)
OSMOLALITY, CALCULATED: 279 mosm/kg (ref 270–290)
POTASSIUM: 3.7 mmol/L (ref 3.5–5.1)
PROTEIN TOTAL: 6.2 g/dL — ABNORMAL LOW (ref 6.4–8.9)
SODIUM: 139 mmol/L (ref 136–145)

## 2024-03-30 LAB — BASIC METABOLIC PANEL
ANION GAP: 8 mmol/L (ref 4–13)
ANION GAP: 8 mmol/L (ref 4–13)
BUN/CREA RATIO: 24 — ABNORMAL HIGH (ref 6–22)
BUN/CREA RATIO: 22 (ref 6–22)
BUN: 15 mg/dL (ref 7–25)
BUN: 17 mg/dL (ref 7–25)
CALCIUM: 9 mg/dL (ref 8.6–10.3)
CALCIUM: 9.1 mg/dL (ref 8.6–10.3)
CHLORIDE: 98 mmol/L (ref 98–107)
CHLORIDE: 98 mmol/L (ref 98–107)
CO2 TOTAL: 31 mmol/L (ref 21–31)
CO2 TOTAL: 31 mmol/L (ref 21–31)
CREATININE: 0.62 mg/dL (ref 0.60–1.30)
CREATININE: 0.79 mg/dL (ref 0.60–1.30)
ESTIMATED GFR: 95 mL/min/1.73mˆ2 (ref >59)
ESTIMATED GFR: 80 mL/min/1.73mˆ2 (ref >59)
GLUCOSE: 105 mg/dL (ref 74–109)
GLUCOSE: 131 mg/dL — ABNORMAL HIGH (ref 74–109)
OSMOLALITY, CALCULATED: 275 mosm/kg (ref 270–290)
OSMOLALITY, CALCULATED: 277 mosm/kg (ref 270–290)
POTASSIUM: 3.7 mmol/L (ref 3.5–5.1)
POTASSIUM: 4.4 mmol/L (ref 3.5–5.1)
SODIUM: 137 mmol/L (ref 136–145)
SODIUM: 137 mmol/L (ref 136–145)

## 2024-03-30 LAB — MYCOPLASMA PNEUMONIAE ANTIBODIES, IGG AND IGM, SERUM
M. PNEUMONIAE AB (IGG): 1.17 — ABNORMAL HIGH (ref <=0.90)
M. PNEUMONIAE AB (IGM): 99 U/mL (ref <770)

## 2024-03-30 LAB — CBC
HCT: 32.6 % (ref 31.2–41.9)
HCT: 34 % (ref 31.2–41.9)
HGB: 11 g/dL (ref 10.9–14.3)
HGB: 11.2 g/dL (ref 10.9–14.3)
MCH: 29.6 pg (ref 24.7–32.8)
MCH: 29.1 pg (ref 24.7–32.8)
MCHC: 33.7 g/dL (ref 32.3–35.6)
MCHC: 32.9 g/dL (ref 32.3–35.6)
MCV: 87.8 fL (ref 75.5–95.3)
MCV: 88.5 fL (ref 75.5–95.3)
MPV: 6.6 fL — ABNORMAL LOW (ref 7.9–10.8)
MPV: 6.6 fL — ABNORMAL LOW (ref 7.9–10.8)
PLATELETS: 282 x10ˆ3/uL (ref 140–440)
PLATELETS: 275 x10ˆ3/uL (ref 140–440)
RBC: 3.71 x10ˆ6/uL (ref 3.63–4.92)
RBC: 3.85 x10ˆ6/uL (ref 3.63–4.92)
RDW: 19 % — ABNORMAL HIGH (ref 12.3–17.7)
RDW: 18.9 % — ABNORMAL HIGH (ref 12.3–17.7)
WBC: 10.4 x10ˆ3/uL (ref 3.8–11.8)
WBC: 11.1 x10ˆ3/uL (ref 3.8–11.8)

## 2024-03-30 LAB — RESPIRATORY CULTURE AND GRAM STAIN (PERFORMABLE)

## 2024-03-30 LAB — FUNGITELL 1-3 B-D GLUCAN
(1-3)-B-D-GLUCAN: <31 pg/mL (ref <60)
INTERPRETATION: NEGATIVE

## 2024-03-30 LAB — ASPERGILLUS AG, BAL
ASPERGILLUS AG, EIA, BAL: NOT DETECTED
INDEX VALUE: 0.04 (ref <0.50)

## 2024-03-30 LAB — LACTIC ACID - FIRST REFLEX: LACTIC ACID: 1 mmol/L (ref 0.5–2.2)

## 2024-03-30 LAB — MAGNESIUM: MAGNESIUM: 1.7 mg/dL — ABNORMAL LOW (ref 1.9–2.7)

## 2024-03-30 MED ORDER — METHYLPREDNISOLONE SOD SUCCINATE 40 MG/ML SOLUTION FOR INJ. WRAPPER
80.0000 mg | Freq: Four times a day (QID) | INTRAMUSCULAR | Status: DC
  Administered 2024-03-30 (×2): 80 mg via INTRAVENOUS
  Filled 2024-03-30 (×2): qty 2

## 2024-03-30 MED ORDER — FUROSEMIDE 500 MG/50 ML (10 MG/ML) INFUSION (NON-TITRATABLE TASK)
5.0000 mg/h | INJECTION | Status: DC
  Filled 2024-03-30 (×2): qty 50

## 2024-03-30 MED ORDER — ACETAZOLAMIDE 500 MG SOLUTION FOR INJECTION
500.0000 mg | INTRAMUSCULAR | Status: AC
  Administered 2024-03-30: 500 mg via INTRAVENOUS
  Filled 2024-03-30: qty 5

## 2024-03-30 MED ORDER — POTASSIUM CHLORIDE 20 MEQ/100ML IN STERILE WATER INTRAVENOUS PIGGYBACK
20.0000 meq | INJECTION | INTRAVENOUS | Status: AC
  Administered 2024-03-30 (×2): 20 meq via INTRAVENOUS
  Administered 2024-03-30 (×2): 0 meq via INTRAVENOUS
  Filled 2024-03-30 (×2): qty 100

## 2024-03-30 MED ORDER — PANTOPRAZOLE 40 MG INTRAVENOUS SOLUTION: 40.0000 mg | Freq: Two times a day (BID) | INTRAVENOUS | Status: DC

## 2024-03-30 MED ORDER — DIATRIZOATE MEGLUMINE-DIATRIZOATE SODIUM 66 %-10 % ORAL SOLUTION
30.0000 mL | ORAL | Status: AC
  Administered 2024-03-30: 30 mL via ORAL

## 2024-03-30 MED ORDER — MAGNESIUM SULFATE 1 GRAM/100 ML IN DEXTROSE 5 % INTRAVENOUS PIGGYBACK
1.0000 g | INJECTION | Freq: Once | INTRAVENOUS | Status: AC
  Administered 2024-03-30: 0 g via INTRAVENOUS
  Administered 2024-03-30: 1 g via INTRAVENOUS
  Filled 2024-03-30: qty 100

## 2024-03-30 NOTE — Respiratory Therapy (Signed)
 03/30/24 0910   Vent Check   Equipment Hamilton C6   Mode PC   Assist Control Variable  Pressure Control   Facial Skin Integrity WNL   Inspired VT (machine) 373 mLs   Expired VT (machine) 328 mLs   ETCO2 37 mmHg   Set Rate 14 Breaths Per Minute   Total Rate 22 Breaths Per Minute   Spontaneous Rate 8 Breaths Per Minute   Actual Minute Volume 6.8 Liters   FiO2 55 %   MAP 13 cmH2O   Actual PEEP 8 cmH2O   Set PEEP 8 cmH2O   PC Set 15 cmH2O   PIP 24 cmH2O   Plateau Pressure 21 cmH2O   Driving Pressure (DP) (Calculated) 13 cmH20   Driving Pressure (C6 Vent Calculated) 13 cmH20   Static Compliance (Manual Calc) 25   I-Time 0.75 seconds   I:E Ratio 1:2.4   Inspiratory Flow 60.2 L/min   Sensitivity 3     Results of abg gave to dr bias., no changes at this time

## 2024-03-30 NOTE — Discharge Summary (Signed)
 [facility]  DISCHARGE SUMMARY    PATIENT NAME:  Brooke Maxwell, Brooke Maxwell  MRN:  [id number]  DOB:  [birth year]    ENCOUNTER DATE:  03/27/2024  INPATIENT ADMISSION DATE: 03/27/2024  DISCHARGE DATE:  03/30/2024    ATTENDING PHYSICIAN: Lovell Grass, MD  SERVICE: PRN HOSPITALIST INTENSIVIST  PRIMARY CARE PHYSICIAN: No Pcp       No lay caregiver identified.    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     Active Non-Hospital Problems    Diagnosis Date Noted    Acute kidney injury (CMS HCC) 02/23/2024    Acute on chronic heart failure with preserved ejection fraction (HFpEF) 02/16/2024    Hypoxia 02/16/2024    Elevated troponin 02/16/2024    NSTEMI (non-ST elevated myocardial infarction) (CMS HCC) 02/14/2024    CAP (community acquired pneumonia) 02/14/2024    Anxiety and depression 02/14/2024    Acute respiratory failure 02/14/2024    Hypomagnesemia 02/14/2024    Sepsis 07/27/2022    Acute renal failure (ARF) 07/27/2022    D-dimer, elevated 07/27/2022    Colitis 07/27/2022    Anxiety 07/27/2022    Hypotension, unspecified hypotension type 07/27/2022    Pulmonary embolism, unspecified chronicity, unspecified pulmonary embolism type, unspecified whether acute cor pulmonale present (CMS HCC) 07/27/2022    Suicidal ideations 06/20/2021    Severe episode of recurrent major depressive disorder, 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        Details   ALPRAZolam  1 mg Tablet  Commonly known as: XANAX    1 mg, EVERY 6 HOURS PRN  Refills: 0     amitriptyline  25 mg Tablet  Commonly known as: ELAVIL    25 mg, NIGHTLY PRN  Refills: 0     ARIPiprazole  30 mg Tablet  Commonly known as: ABILIFY    1 Tablet, Daily  Refills: 0     aspirin  81 mg Tablet, Chewable   81 mg, Oral, Daily  Refills: 0     atorvastatin  80 mg Tablet  Commonly known as: LIPITOR   80 mg, Oral, EVERY EVENING  Refills: 0     azithromycin 250 mg Tablet  Commonly known as: ZITHROMAX   500 mg, Daily  Refills: 0     benzonatate 100 mg Capsule  Commonly known as: TESSALON   100 mg, 3 TIMES DAILY PRN  Refills: 0     budesonide  0.5 mg/2 mL nebulizer suspension  Commonly known as: PULMICORT  RESPULES   0.5 mg, 2 TIMES DAILY  Refills: 0     busPIRone  10 mg Tablet  Commonly known as: BUSPAR    10 mg, 2 TIMES DAILY  Refills: 0     clonazePAM  1 mg Tablet  Commonly known as: klonoPIN    1 mg, 3 TIMES DAILY  Refills: 0     enoxaparin  40  mg/0.4 mL Syringe  Commonly known as: LOVENOX    40 mg, EVERY 24 HOURS  Refills: 0     FLUoxetine  20 mg Capsule  Commonly known as: PROzac    20 mg, Daily  Refills: 0     guaiFENesin 100 mg/5 mL Liquid   200 mg, EVERY 6 HOURS PRN  Refills: 0     Ibuprofen 800 mg Tablet  Commonly known as: MOTRIN   800 mg, 3 TIMES DAILY PRN  Refills: 0     ipratropium-albuteroL  0.5 mg-3 mg(2.5 mg base)/3 mL nebulizer solution  Commonly known as: DUONEB   3 mL, Nebulization, 4 TIMES DAILY  Refills: 0     levalbuterol HCL 1.25 mg/3 mL Solution for Nebulization  Commonly known as: XOPENEX   1.25 mg, EVERY 4 HOURS PRN  Refills: 0     metFORMIN 500 mg Tablet  Commonly known as: GLUCOPHAGE   500 mg, 2 TIMES DAILY WITH FOOD  Refills: 0     montelukast 10 mg Tablet  Commonly known as: SINGULAIR   10 mg, EVERY EVENING  Refills: 0     ondansetron  4 mg Tablet, Rapid Dissolve  Commonly known as: ZOFRAN  ODT   4 mg, Oral, EVERY 8 HOURS PRN  Qty: 12 Tablet  Refills: 0      pantoprazole  40 mg Tablet, Delayed Release (E.C.)  Commonly known as: PROTONIX    40 mg, Daily  Refills: 0     potassium chloride  20 mEq/15 mL Liquid   20 mEq, EVERY MORNING WITH BREAKFAST  Refills: 0     * predniSONE 10 mg Tablet  Commonly known as: DELTASONE   15 mg, Daily  Refills: 0     * predniSONE 10 mg Tablet  Commonly known as: DELTASONE  Start taking on: March 31, 2024   10 mg, Daily  Refills: 0     * predniSONE 5 mg Tablet  Commonly known as: DELTASONE  Start taking on: April 03, 2024   5 mg, Daily  Refills: 0     rOPINIRole  0.25 mg Tablet  Commonly known as: REQUIP    0.25 mg, NIGHTLY  Refills: 0     tamsulosin  0.4 mg Capsule  Commonly known as: FLOMAX    0.4 mg, EVERY EVENING AFTER DINNER  Refills: 0     zolpidem 10 mg Tablet  Commonly known as: AMBIEN   10 mg, NIGHTLY PRN  Refills: 0           * This list has 3 medication(s) that are the same as other medications prescribed for you. Read the directions carefully, and ask your doctor or other care provider to review them with you.                ASK your doctor about these medications.        Details   * predniSONE 10 mg Tablet  Commonly known as: DELTASONE  Ask about: Should I take this medication?   20 mg, Daily  Refills: 0           * This list has 1 medication(s) that are the same as other medications prescribed for you. Read the directions carefully, and ask your doctor or other care provider to review them with you.                Discharge med list refreshed?  YES     Allergies[1]  HOSPITAL PROCEDURE(S):   Orders Placed This Encounter   Procedures    BEDSIDE  INTUBATION  BEDSIDE  MISC PROCEDURE       REASON FOR HOSPITALIZATION AND HOSPITAL COURSE   BRIEF HPI:  This is a 73 y.o., female admitted for respiratory failure    BRIEF HOSPITAL NARRATIVE:     Brooke Maxwell is a 73 year old female who presented to the ED here from inpatient rehab center with worsening shortness of breath and hypoxia. She had admission here 02/14/24-02/24/24 with  shortness of breath; she was found to have diffuse ground glass lung opacities. She had infectious workup that was negative, suspected of having possible ILD and treated with steroids with some improvement. She was transferred to [facility] facility in [place], [place] 02/24/24, and discharged to [facility] inpatient rehab on 03/23/24.     She denied any history of lung problems prior to 02/14/24 admission. She had no history of smoking or vaping. 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 She had several cats, but no birds. She had CTPA at rehab center 12/25 due to worsening SOB that showed no evidence of PE, did show bilateral GGOs involving all lobes. Additional medical history significant for bipolar I disorder, HTN, anxiety, obesity, and hyperlipidemia.    In ED, she was noted to be tachycardic, tachypneic, and hypoxic on presentation. WBC 12.9k. LA 2.2. COVID/Flu/RSV negative. She was initially placed on BiPAP in ED, but remained significantly dyspneic, with min ventilation >20 L/min, and RR 50. She was intubated in ED and placed on mechanical ventilator.    She was admitted to ICU. Continued on sedation, routine ventilator orders. She was suspected of having ILD, and extensive workup sent for vasculitis, hypersensitivity, eosinophilic PNA, infectious cause, fungal pneumonia including PJP, atypical pathogens, pulmonary edema. Arterial line placed. Bronchoscopy performed 12/26, with no endobronchial mass, stenosis, or bleeding, no hyperemia. Serial BAL performed, was not consistent with DAH. BAL samples sent for workup.     She was started on levofloxacin  per ID recommendations. Bactrim  treatment dose (PJP) added pending PJP PCR. ESR 72. CRP 22.1. BNP 63. LDH 596. Rheumatoid factor <13. ANA negative. C3, C4 WNL. CCP negative. HIV negative. Myeloperoxidaxe and proteinase antibodies negative. JO1 negative. Urine without any proteinuria or hematuria. Sputum culture from BAL grew scant yeast  and diphtheroids. MRSA screen negative. S.pneumo/Legionella urine antigens negative. Blood cultures negative. Aspergillus (galactomannin) antigen negative. Hypersensitivity panel pending. Mycoplasma pneumoniae antigen pending. Fungitell B-D glucan negative (<31). Pneumocystis jirovecii PCR from BAL pending. Cell count and differential from BAL pending. Echo shows LVEF 60%, mild AS.     She was treated with levofloxacin  (D4)/Bactrim  (D3). She received methylprednisolone  250 mg IV BID x 3 days (through 12/28), and transitioned to 80 mg q6h. She has been diuresed, currently on Lasix  drip at 5 mg/hr. She remains sedated with propofol /dexmedetomidine . She is not requiring any vasopressors. Currently FiO2 50% with PEEP 8 cmH2O.   She did have degree of ileus when receiving fentanyl , which was stopped, and she received bowel regimen (senna/PEG) and dose methylnaltrexone . She had repeated KUBs which demonstrated gastric distention, and she was noted to develop some coffee ground material from NGT 12/29. Prophylactic LMWH held, and chronic ASA held. PPI increased to BID.     Case was discussed with pulmonology at [facility]. 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 She was accepted in transfer there for further management to service of  Dr. Gracia in medical ICU there, and will be transferred there via CCT once bed available.        TRANSITION/POST DISCHARGE CARE/PENDING TESTS/REFERRALS: Transfer to [facility]    CONDITION ON DISCHARGE:  A. Ambulation: Bed rest  B. Self-care Ability: Incapable  C. Cognitive Status sedated; follows commands on waking  D. Code status at discharge:       LINES/DRAINS/WOUNDS AT DISCHARGE:   Patient Lines/Drains/Airways Status       Active Line / Dialysis Catheter / Dialysis Graft / Drain / Airway / Wound       Name  Placement date Placement time Site Days    Peripheral IV Anterior;Left 03/27/24  1049  -- 3    Peripheral IV Left Basilic  (medial side of arm) 03/29/24  1100  -- 1    Oral Gastric Tube 03/28/24  0950  -- 2    Foley Catheter 03/27/24  1530  -- 3    EndoTracheal Tube 8.0 22;Right Lip 03/27/24  1320  -- 3                    DISCHARGE DISPOSITION:  Transfer to acute care facility.  The patient is being transferred to [facility] due to advanced pulmonology/ILD management, possible transbronchial biopsy, potential ECMO (none available here).  The benefits of the transfer outweigh the risks, and the patient representative has consented to the transfer.  The accepting facility has available space, time, and capacities to perform pulmonology evaluation, transbronchial biopsy.    [facility] has verified the capability at the accepting facility.  The nearest facility for the procedure was discussed with the patient representative.  Consideration for alternative procedures that may be performed at [facility] was considered.  DISCHARGE INSTRUCTIONS:    No discharge procedures on file.       Donnice Larve, APRN, CNP    Copies sent to Care Team         Relationship Specialty Notifications Start End    Pcp, No PCP - General   11/11/23             Referring providers can utilize [email or web address] to access their referred [facility] Medicine patient's information.              ATTESTATION:  I evaluated and examined the patient. I agree with the assessment and plan as documented.    Deatrice Barman, MD                     [1]   Allergies  Allergen Reactions    Cefaclor Anaphylaxis    Citalopram  Mental Status Effect and Itching

## 2024-03-30 NOTE — Care Plan (Signed)
 Medical Nutrition Therapy Follow Up    SUBJECTIVE: Patient remains intubated.      OBJECTIVE:   Current Diet Order/Nutrition Support:  ADULT TUBE FEEDING - CONTINUOUS DRIP CONTINUOUS NO MEALS, TF ONLY; Jevity 1.5 ; OG; Goal Rate (ml/hr): 30; Initial Rate (ml/hr): 15; Increase by: 5 ml/hr q2h; Flush Tube with water  (select amt): 100 ml; Flush Frequency: Q6H     Comments: Patient had an ileus which had some improvement.  TF was started on 12/27 but soon after it was held due to vomiting.  Per RN, she's passing more gas today.  Plan includes repeat KUB today.    Plan/Interventions : Once GI function is improved, recommend restarting TF.  Once tolerated, recommend goal rate of 45 ml/hr.    Sheniya Garciaperez, RD

## 2024-03-30 NOTE — Respiratory Therapy (Signed)
 03/30/24 0500   SBT (Spontaneous Breathing Trial)   SBT Screening Exclusions Uncontrolled agitation  (when awake)

## 2024-03-30 NOTE — Respiratory Therapy (Signed)
 03/30/24 1343   Vent Check   Equipment Hamilton C6   Mode PC   Assist Control Variable  Pressure Control   FiO2 50 %     Sp02 97% on 55. Decreased to 50%

## 2024-03-30 NOTE — Nurses Notes (Signed)
 Report called to Idell, RN at [facility]. Patient to go to [facility] ICU room 874.

## 2024-03-30 NOTE — Nurses Notes (Signed)
 Care endorsed to PRS. Patient loaded onto stretcher and headed to [facility].

## 2024-03-30 NOTE — Progress Notes (Signed)
 [facility]   Critical Care Progress Note    Brooke Maxwell  Date of service: 03/30/2024  Date of Admission:  03/27/2024  Hospital Day:  LOS: 3 days     Summary:  73 yo M with PMH depression, anxiety w no pulm Hx and recent admission 11/14 w ?pneumonitis treated w antibiotics and steroids, DC to [facility] and then [facility] on oxygen pw worsening SOB. CTA neg for PE but continued to show worsening diffuse patchy GGOs w possible trapping and peripheral consolidations. Intubated in ER. S/p Bronch and BAL RLL, negative for DAH.     Subjective/Objective: Intubated, awake, following commands    Review of Systems:    As per above    Vital signs: BP 98/67   Pulse 72   Temp 36.7 C (98 F)   Resp 20   Ht 1.549 m (5' 1)   Wt 83.2 kg (183 lb 6.8 oz)   SpO2 96%   BMI 34.66 kg/m         Vent Settings:  Vent Check  Equipment: Hamilton C6  Circuit Compliance Status/TRC: Off  Mode: 2  Assist Control Variable : Pressure Control  Facial Skin Integrity: WNL  Inspired VT (machine): 373 mLs  Expired VT (machine): 328 mLs  ETCO2: 37 mmHg  Set Rate: 14 Breaths Per Minute  Total Rate: 22 Breaths Per Minute  Spontaneous Rate: 8 Breaths Per Minute  Actual Minute Volume: 6.8 Liters  FiO2: 55 %  MAP: 13 cmH2O  IPAP: 14 cmH20  Actual PEEP: 8 cmH2O  Set PEEP: 8 cmH2O  PC Set: 15 cmH2O  PIP: 24 cmH2O  Plateau Pressure: 21 cmH2O  Driving Pressure (DP) (Calculated): 13 cmH20  Driving Pressure (C6 Vent Calculated): 13 cmH20  Static Compliance (Manual Calc): 25  I-Time: 0.75 seconds  I:E Ratio: 1:2.4  Inspiratory Flow: 60.2 L/min  Sensitivity: 3  Rise Time (Sec): 7 seconds    I/O:  I/O last 24 hours:    Intake/Output Summary (Last 24 hours) at 03/30/2024 1057  Last data filed at 03/30/2024 0858  Gross per 24 hour   Intake 549.84 ml   Output 2930 ml   Net -2380.16 ml       Exam:  Constitutional: Intubated, awake, following commands  Eyes: Pupils are equal, round, and reactive to light.   Cardiovascular: S1, S2, Normal rate,  regular rhythm and normal heart sounds. No murmur heard.  Pulmonary/Chest: Breath sounds decreased b/l lower lungs. No respiratory distress. no wheezes.  no rales.   Abdominal: Soft. Bowel sounds are normal.  no distension. no abdominal tenderness.   Neurological: no gross focal deficit, intubated       alcohol  62 % (NOZIN NASAL SANITIZER) nasal swab packet, 1 Each, Each Nostril, 2x/day  aspirin  chewable tablet 81 mg, 81 mg, Oral, Daily  atorvastatin  (LIPITOR) tablet, 80 mg, Oral, QPM  budesonide  (PULMICORT  RESPULES) 0.5 mg/2 mL nebulizer suspension, 1 mg, Nebulization, 2x/day  cholecalciferol  (VITAMIN D3) 1000 unit (25 mcg) tablet, 2,000 Units, Oral, Daily  NS 250 mL flush bag, , Intravenous, Q15 Min PRN   And  D5W 250 mL flush bag, , Intravenous, Q15 Min PRN  NS 250 mL flush bag, , Intravenous, Q15 Min PRN   And  D5W 250 mL flush bag, , Intravenous, Q15 Min PRN  dexmedeTOMIDine  (PRECEDEX ) 1,000 mcg in NS 250 mL (tot vol) infusion, 0-1.5 mcg/kg/hr (Adjusted), Intravenous, Continuous  enoxaparin  PF (LOVENOX ) 40 mg/0.4 mL SubQ injection, 40 mg, Subcutaneous, Q24H  FLUoxetine  (PROzac )  capsule, 20 mg, Oral, Daily  furosemide  (LASIX ) 500 mg/50 mL (10 mg/mL) infusion, 5 mg/hr, Intravenous, Continuous  ipratropium-albuterol  0.5 mg-3 mg(2.5 mg base)/3 mL Solution for Nebulization, 3 mL, Nebulization, 4x/day  ipratropium-albuterol  0.5 mg-3 mg(2.5 mg base)/3 mL Solution for Nebulization, 3 mL, Nebulization, 4x/day PRN  lactulose  (ENULOSE ) 10g per 15mL oral liquid, 30 mL, Oral, Daily  levoFLOXacin  (LEVAQUIN ) 750 mg in D5W 150 mL premix IVPB, 750 mg, Intravenous, Q24H  methylnaltrexone  (RELISTOR ) 12 mg/0.6 mL injection, 12 mg, Subcutaneous, Q48H PRN  methylPREDNISolone  sod succ (SOLU-medrol ) 40 mg/mL injection, 80 mg, Intravenous, Q6H  midazolam  (VERSED ) 5 mg/mL injection, 2 mg, Intravenous, Once  norepinephrine  (LEVOPHED ) 16 mg in NS 250mL premix infusion, 0-0.3 mcg/kg/min, Intravenous, Continuous  NS flush syringe, 3 mL,  Intracatheter, Q8HRS  NS flush syringe, 3 mL, Intracatheter, Q1H PRN  NS flush syringe, 3 mL, Intracatheter, Q8HRS  NS flush syringe, 3 mL, Intracatheter, Q1H PRN  pantoprazole  (PROTONIX ) 4 mg/mL injection, 40 mg, Intravenous, Daily  polyethylene glycol (MIRALAX ) oral packet, 17 g, Oral, Daily  potassium chloride  20 mEq in SW 100 mL premix infusion, 20 mEq, Intravenous, Q2H  propofol  (DIPRIVAN ) 10 mg/mL premix infusion, 0-50 mcg/kg/min (Adjusted), Intravenous, Continuous  sennosides-docusate sodium  (SENOKOT-S) 8.6-50mg  per tablet, 1 Tablet, Oral, 2x/day  sodium chloride  (0.9% SALINE) nebulizer solution, 3 mL, Nebulization, 2x/day  [Held by provider] tamsulosin  (FLOMAX ) capsule, 0.4 mg, Oral, Daily after Dinner  trimethoprim -sulfamethoxazole  (BACTRIM  DS) 160-800mg  per tablet, 2 Tablet, Oral, Q8H        Labs:  CBC   Recent Labs     03/28/24  0513 03/29/24  0536 03/30/24  0514   WBC 10.6 15.5* 10.4   HGB 10.7* 10.8* 11.0   HCT 31.2 32.0 32.6   PLTCNT 283 332 282      Chemistries   Recent Labs     03/28/24  0513 03/28/24  0814 03/29/24  0536 03/29/24  2132 03/30/24  0042 03/30/24  0513 03/30/24  0939   SODIUM 139   < > 138   < > 137 139 131*   POTASSIUM 3.8  --  3.5  --  3.7 3.7  --    CHLORIDE 99   < > 100   < > 98 99 95*   CO2 30  --  29  --  31 32*  --    BUN 13  --  16  --  15 17  --    CREATININE 0.37*  --  0.51*  --  0.62 0.62  --    CALCIUM 9.0  --  9.2  --  9.0 8.9  --    ALBUMIN  2.8*  --  2.9*  --   --  2.9*  --    MAGNESIUM  1.5*  --  1.9  --   --  1.7*  --    PHOSPHORUS 3.5*  --   --   --   --   --   --     < > = values in this interval not displayed.      Liver Enzymes   Recent Labs     03/28/24  0513 03/29/24  0536 03/30/24  0513   TOTALPROTEIN 5.9* 6.0* 6.2*   ALBUMIN  2.8* 2.9* 2.9*   AST 15 11* 12*   ALT 13 11 12    ALKPHOS 80 85 79      Inflammatory Markers   Recent Labs     03/27/24  1114 03/27/24  1245 03/27/24  1325 03/27/24  1434 03/27/24  1631 03/27/24  2026 03/30/24  0042   CRP 22.1*  --   --   --    --   --   --    LACTICACID 2.2 1.8 2.3* 2.5* 1.9 1.0 1.0       Arterial Blood Gases   Recent Labs     03/29/24  2132 03/30/24  0939   PH 7.25* 7.45   PCO2 42 50*   PO2 74* 75*   BICARBONATE 18.1* 31.5*   BASEEXCESS  --  8.7*      Cardiac & Coags   Recent Labs     03/27/24  1114 03/27/24  1245   BNP  --  63   INR 1.30*  --       Lipid Panel   No results for input(s): HDL, CHOL in the last 72 hours.    Invalid input(s): LDL     Urinalysis:   Recent Labs     03/27/24  1217 03/27/24  1240 03/30/24  0939   LEUKOCYTES 500*  --   --    NITRITE Negative  --   --    UROBILINOGEN Normal  --   --    PROTEIN Negative  --   --    PH  --    < > 7.45   BILIRUBIN Negative  --   --    GLUCOSE  --    < > 132*    < > = values in this interval not displayed.        Radiology:  Results for orders placed or performed during the hospital encounter of 03/27/24 (from the past 24 hours)   XR KUB     Status: None    Narrative    Juanita K Jarnigan    RADIOLOGIST: Camellia Elsie Coder, MD    XR KUB performed on 03/30/2024 9:56 AM.    CLINICAL HISTORY: Follow up ileus   followup on ileus    TECHNIQUE: Single view abdomen.    COMPARISON:  03/28/2024    FINDINGS:  There remains gas distended loops of small bowel with air in the colon. The appearance is similar allowing for differences in technique. There is marked gaseous distention of the stomach. The findings could indicate a persistent ileus or partial small bowel obstruction.    No suspicious calcifications.    There is diffuse osteopenia, severe degenerative change of the spine and moderate scoliosis.    There is a mild volume of stool.        Impression    NO SIGNIFICANT CHANGE FROM THE PREVIOUS STUDY.        Radiologist location ID: [id number]     XR AP MOBILE CHEST     Status: None    Narrative    Neyla K Negro      PROCEDURE DESCRIPTION: XR PORTABLE CHEST X-RAY    CLINICAL HISTORY:Follow up bilateral GGOs; intubated; follow-up infiltrates    COMPARISON:03/29/2024          FINDINGS:  A  single view of the chest was obtained. Patchy bilateral infiltrates are again identified which appear similar to the previous examination. A poor inspiratory effort is again noted. The endotracheal tube is in satisfactory position. A nasogastric tube is identified in the proximal stomach. The heart size is within limits of normal. Minimal blunting of both costophrenic angles is noted. Mild to moderate gastric dilatation is seen.           Impression  Bilateral infiltrates are again identified and appear similar to the previous study. Mild-to-moderate gastric dilatation is noted.      Radiologist location ID: [id number]               Assessment & Plan:  Active Hospital Problems    Diagnosis    Primary Problem: Acute on chronic respiratory failure with hypoxemia (CMS HCC)    Ground glass opacity present on imaging of lung    Pneumonitis, interstitial    GAD (generalized anxiety disorder)    Bipolar 1 disorder (CMS HCC)     Summary:  73 yo M with PMH depression, anxiety, spondylolisthesis lumbar spine w no pulm Hx and Recent hospitalization on 11/14 for respiratory failure requiring high-flow nasal cannula oxygen, high peripheral eosinophil count, with negative infectious workup, 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  Hence on steroid long taper with some clinical improvement and eventual weaning oxygen down to low-flow, DC to [facility] and then [facility] on oxygen pw acute hypoxemic respiratory failure requiring intubation. CTA neg for PE but continued to show worsening diffuse patchy GGOs w possible trapping and peripheral consolidations. Admitted to medical ICU for further management.     # Acute hypoxemic hypercapnic respiratory failure in setting of DPLD/Pulmonary edema/HAP  # ARDS physiology  # Anxiety and depression  # Hx of moderate L hydronephrosis  # ileus with persistent gastric dilatation  # Contraction alkalosis    CTA this admission negative for PE.   Shows extensive ground-glass opacities with a evidence of air trapping and traction bronchiectasis and some peripheral consolidation involving all lobes.  In comparison to prior admission CT it is worse, tracked back to September 20, 2023 CT there has been subpleural reticular opacities present.    No PFTs on file.  Nonsmoker.  No known antigen exposure except cats at home.  Autoimmune workup so far unremarkable including ANA/anti CCP/RF and complement level.  Follow up on ANCA panel and anti GBM.  Has been on steroid therapy, LDH high, PJP remains in differential, pending beta D glucan and PJP bronchoalveolar lavage, for now on Bactrim  therapeutic dose. Alveolar hemorrhage ruled out.  Bronchoalveolar lavage cellular pattern we will certainly clarify the picture more, CEP/NSIP/HP/COP remains in differential.    Continue with Levaquin , bronchial washings growing light growth diphtheroids.  There is an element of congestion and will continue with aggressive diuresis, with intermittent alkaline diuresis to keep bicarb in acceptable range.   Completed pulse dose steroids 3 days and now 80 Q6 hr with prolonged taper.   PF ratio has improved but is consistent with ARDS physiology, dynamic driving pressure and MP is within acceptable range. 8 of peep 55% FiO2   Could not tolerate tube feeds concern for ileus and persistent gaseous distention, no BM yet, fentanyl  drip has been off and received methyl natrexone, will consider CT abdomen and GI input.   Propofol  for sedation  Prior 2D echo preserved LV function, no gross valvular pathologies.  Scheduled nebs  Continue aspirin /statin.    GI prophylaxis PPI    deep vein thrombosis prophylaxis Lovenox      Disposition medical ICU         Deatrice Barman, MD  Intensivist

## 2024-03-30 NOTE — Care Management Notes (Signed)
 Pt was admitted from [facility], sent through clinicals via careport and requesting if pt can go back when medically stable to be discharged.

## 2024-03-30 NOTE — Respiratory Therapy (Signed)
 03/30/24 1423   Vent Check   Equipment Hamilton C6   Set Rate 10 Breaths Per Minute   Total Rate 23 Breaths Per Minute   Spontaneous Rate 13 Breaths Per Minute

## 2024-03-30 NOTE — Respiratory Therapy (Signed)
 03/30/24 1714   Vent Check   Equipment Hamilton C6   Mode PC   Assist Control Variable  Pressure Control   Facial Skin Integrity WNL   Inspired VT (machine) 439 mLs   Expired VT (machine) 401 mLs   ETCO2 27 mmHg   Set Rate 10 Breaths Per Minute   Total Rate 22 Breaths Per Minute   Spontaneous Rate 12 Breaths Per Minute   Actual Minute Volume 6.2 Liters   FiO2 50 %   Actual PEEP 9 cmH2O   Set PEEP 8 cmH2O   PC Set 14 cmH2O   PIP 25 cmH2O   Plateau Pressure 21 cmH2O   Driving Pressure (DP) (Calculated) 12 cmH20   Driving Pressure (C6 Vent Calculated) 13 cmH20   Static Compliance (Manual Calc) 30   I-Time 0.75 seconds   I:E Ratio 1:2.8   Inspiratory Flow 65 L/min   Sensitivity 3     Patient hemodynamically stable. No changes made

## 2024-03-30 NOTE — Respiratory Therapy (Addendum)
 03/30/24 1423   Vent Assessment   Start Time 1423   Orders Updated in EMR Yes   Assessment Vent Change   Reason For Change ETCO2   Heart Rate 75   SpO2 95 %   PETCO2 27 mmHg     Patient tachypnea respirations 26. Etco2 low 27. Dcreased rate to 10. Respirations have slowed down 22 to 23 etco2 now 28

## 2024-03-30 NOTE — Care Plan (Signed)
 Patient to be transferred to [facility] for transbronchial biopsy. Awaiting transport confirmation after flight denied. Family aware of transfer and at bedside. Patient had CT of abdomen. Transition readiness assessment ongoing.       Problem: Adult Inpatient Plan of Care  Goal: Absence of Hospital-Acquired Illness or Injury  Outcome: Ongoing (see interventions/notes)  Intervention: Identify and Manage Fall Risk  Recent Flowsheet Documentation  Taken 03/30/2024 1200 by Elida CROME, RN  Safety Promotion/Fall Prevention: fall prevention program maintained  Taken 03/30/2024 0827 by Elida CROME, RN  Safety Promotion/Fall Prevention: fall prevention program maintained  Intervention: Prevent Skin Injury  Recent Flowsheet Documentation  Taken 03/30/2024 1600 by Elida CROME, RN  Body Position:   supine, head elevated   heels elevated off mattress  Taken 03/30/2024 1400 by Elida CROME, RN  Body Position:   supine, head elevated   heels elevated off mattress  Taken 03/30/2024 1200 by Elida CROME, RN  Body Position:   supine, head elevated   heels elevated off mattress  Taken 03/30/2024 1100 by Elida CROME, RN  Skin Protection:   adhesive use limited   incontinence pads utilized   preventative decubiti skin protection foam dressing applied/intact  Taken 03/30/2024 1000 by Elida CROME, RN  Body Position:   heels elevated off mattress   side lying, right  Taken 03/30/2024 0800 by Elida CROME, RN  Body Position:   supine, head elevated   heels elevated off mattress  Goal: Optimal Comfort and Wellbeing  Outcome: Ongoing (see interventions/notes)  Goal: Rounds/Family Conference  Outcome: Ongoing (see interventions/notes)     Problem: Skin Injury Risk Increased  Goal: Skin Health and Integrity  Outcome: Ongoing (see interventions/notes)  Intervention: Optimize Skin Protection  Recent Flowsheet Documentation  Taken 03/30/2024 1200 by Elida CROME, RN  Pressure Reduction Techniques:   Heels elevated off of the bed   Patient turned q 2 hours  Pressure  Reduction Devices: Specialty bed/mattress utilized  Taken 03/30/2024 1100 by Elida CROME, RN  Pressure Reduction Techniques:   Heels elevated off of the bed   Moisture, shear and nutrition are maximized  Pressure Reduction Devices: Specialty bed/mattress utilized  Skin Protection:   adhesive use limited   incontinence pads utilized   preventative decubiti skin protection foam dressing applied/intact  Taken 03/30/2024 1000 by Elida CROME, RN  Activity Management: bedrest  Head of Bed [facility]) Positioning: HOB elevated  Taken 03/30/2024 0827 by Elida CROME, RN  Pressure Reduction Techniques:   Heels elevated off of the bed   Patient turned q 2 hours  Pressure Reduction Devices: Specialty bed/mattress utilized     Problem: Gas Exchange Impaired  Goal: Optimal Gas Exchange  Outcome: Ongoing (see interventions/notes)  Intervention: Optimize Oxygenation and Ventilation  Recent Flowsheet Documentation  Taken 03/30/2024 1000 by Elida CROME, RN  Head of Bed [facility]) Positioning: HOB elevated     Problem: Mechanical Ventilation Invasive  Goal: Effective Communication  Outcome: Ongoing (see interventions/notes)  Goal: Optimal Device Function  Outcome: Ongoing (see interventions/notes)  Intervention: Optimize Device Care and Function  Recent Flowsheet Documentation  Taken 03/30/2024 1200 by Elida CROME, RN  Airway Safety Measures:   mask valve resuscitator at bedside   manual resuscitator/mask at bedside   oxygen flowmeter at bedside   suction at bedside  Taken 03/30/2024 1000 by Elida CROME, RN  Oral Care: oral care provided  Taken 03/30/2024 0827 by Elida CROME, RN  Airway Safety Measures:   mask valve resuscitator at bedside   manual resuscitator/mask at bedside  oxygen flowmeter at bedside   suction at bedside  Goal: Mechanical Ventilation Liberation  Outcome: Ongoing (see interventions/notes)  Goal: Optimal Nutrition Delivery  Outcome: Ongoing (see interventions/notes)  Goal: Absence of Device-Related Skin and Tissue Injury  Outcome: Ongoing  (see interventions/notes)  Goal: Absence of Ventilator-Induced Lung Injury  Outcome: Ongoing (see interventions/notes)  Intervention: Prevent Ventilator-Associated Pneumonia  Recent Flowsheet Documentation  Taken 03/30/2024 1000 by Elida CROME, RN  Oral Care: oral care provided  Head of Bed [facility]) Positioning: HOB elevated     Problem: Fall Injury Risk  Goal: Absence of Fall and Fall-Related Injury  Outcome: Ongoing (see interventions/notes)  Intervention: Promote Scientist, Clinical (histocompatibility And Immunogenetics) Documentation  Taken 03/30/2024 1200 by Elida CROME, RN  Safety Promotion/Fall Prevention: fall prevention program maintained  Taken 03/30/2024 0827 by Elida CROME, RN  Safety Promotion/Fall Prevention: fall prevention program maintained

## 2024-03-30 NOTE — Respiratory Therapy (Signed)
 03/30/24 0115   Vent Assessment   Start Time 0100   Head of Bed (HOB) Positioning 30 degrees   Assessment Vent Change   Reason For Change W   Proximal  Temperature 35 C (95 F)   H2O Bag (VENT) Checked   HEPA/Hydrophobic Filter checked   Circuit Checked   Inline Suction Catheter Checked   $ Cuff Check (Resp only) Y   Method used PM   Cuff Inflated   Cuff Pressure 30 cmH2O   Heart Rate 76   SpO2 95 %   ETCO2 38 mmHg   Airway   Airway Type ETT   EndoTracheal Tube 8.0 22;Right Lip   Intubation Date/Intubation Time: 03/27/24 1320   Reason For Intubation: Emergent  Tube Size: 8.0  Position: 22;Right  Landmark: Lip  Intubation Confirmation: Chest Xray;ETCO2;Auscultation   ET tube measurement (cm) 23 cm   ET Tube Landmark at the Lip   Physical Assessment of ETT Performed by RT   Vent Check   Equipment Hamilton C6   Mode PC   Facial Skin Integrity WNL   Inspired VT (machine) 360 mLs   Expired VT (machine) 353 mLs   ETCO2 38 mmHg   Set Rate 14 Breaths Per Minute   Total Rate 21 Breaths Per Minute   Actual Minute Volume 7.4 Liters   FiO2 55 %   MAP 13 cmH2O   Set PEEP 8 cmH2O   PC Set 15 cmH2O   PIP 24 cmH2O   Plateau Pressure 22 cmH2O   Static Compliance (Manual Calc) 26.3   I-Time 0.75 seconds   I:E Ratio 1:3   Inspiratory Flow 62.4 L/min   Vent Alarms   Audible Alarms Checked & Functioning   Hi Pressure 40 cmH2O   Lo Pressure 5 cmH2O   High Min Volume 20 Liters   Low Min Volume 2 Liters   Hi RR 40 breaths per minute   Low RR 5 breaths per minute   High Spont VT 1000 mL   Low Spont VT 100 mL   Apnea Alarm 20 seconds   $Ventilator Charges$   $ Vent Charge Capture Vent ICU-Sub     PC decreased to 15. PIP and delta pressure decreased with PRN tx and lowered PC

## 2024-03-30 NOTE — Respiratory Therapy (Signed)
 Latest Reference Range & Units 03/30/24 09:39   %FIO2 % 55   PH 7.35 - 7.45  7.45   PCO2 35 - 45 mm/Hg 50 (H)   PO2 83 - 108 mm/Hg 75 (L)   BICARBONATE 21.0 - 28.0 mmol/L 31.5 (H)   BASE EXCESS 0.0 - 3.0 mmol/L 8.7 (H)   PAO2/FIO2 RATIO  136   O2CT % 24.1   O2 SATURATION (ARTERIAL) 94.0 - 98.0 % 95.5   HEMATOCRITRT 37 - 50 % 56 (H)   CARBOXYHEMOGLOBIN <=3.0 % 1.8   HEMOGLOBIN 12.0 - 18.0 g/dL 81.4 (H)   MET-HEMOGLOBIN <=1.5 % 0.8   OXYHEMOGLOBIN 90.0 - 95.0 % 92.9   SODIUM 136 - 145 mmol/L 131 (L)   LACTATE <=1.9 mmol/L 1.2   CHLORIDE 98 - 107 mmol/L 95 (L)   GLUCOSE 65 - 125 mg/dL 867 (H)   IONIZED CALCIUM 1.15 - 1.33 mmol/L 1.18   HEMOLYSIS None, Mild  None   WHOLE BLOOD K+ 3.5 - 5.1 mmol/L 3.3 (L)   (H): Data is abnormally high  (L): Data is abnormally low    Results gave to dr bias, no changes made at this time. Will be dropping a esophageal balloon

## 2024-03-30 NOTE — Nurses Notes (Signed)
 Flight declined due to weather at this time.

## 2024-03-30 NOTE — Nurses Notes (Signed)
 RETURNED FROM CT SCAN. RECONNECTED TO MECHANICAL VENTILATOR. NOTED TO HAVE VOMITED SMALL AMOUNT OF COFFEE GROUNDS DRAINAGE. OGT CONNECTED TO LCS.

## 2024-03-30 NOTE — Progress Notes (Signed)
 Discussed with patient family who are open to transfer to tertiary level for consideration of further workup of DPLD/+-cryo/transbronchial biopsy. I discussed the case with [facility] transfer line and they have accepted the patient. Accepting physician is Dr Gracia.

## 2024-03-31 DIAGNOSIS — J984 Other disorders of lung: Secondary | ICD-10-CM

## 2024-03-31 LAB — PROTEINASE 3 ANTIBODIES, IGG, SERUM
PROTEINASE ANTIBODIES IGG, QUALITATIVE: NEGATIVE
PROTEINASE ANTIBODIES IGG, QUANTITATIVE: <0.2 [AU]/ml (ref <1.0)

## 2024-03-31 LAB — CYTOPATHOLOGY, NON GYN

## 2024-03-31 LAB — GLOMERULAR BASEMENT MEMBRANE ANTIBODIES, IGG, SERUM
GLOMERULAR BASEMENT MEMBRANE ANTIBODY IGG QUAL: NEGATIVE
GLOMERULAR BASEMENT MEMBRANE ANTIBODY IGG QUANT: <0.2 AI (ref <1.0)

## 2024-03-31 LAB — MYELOPEROXIDASE ANTIBODIES, IGG, SERUM
MYELOPEROXIDASE ANTIBODIES IGG QUALITATIVE: NEGATIVE
MYELOPEROXIDASE ANTIBODIES IGG QUANTITATIVE: <0.2 [AU]/ml (ref <1.0)

## 2024-03-31 LAB — HISTOPLASMA GALACTOMANNAN ANTIGEN, URINE

## 2024-04-01 LAB — PNEUMOCYSTIS JIROVEII,QUALITATIVE PCR: P.JIROVECII DNA,QL RT PCR: DETECTED — AB

## 2024-04-01 LAB — ADULT ROUTINE BLOOD CULTURE, SET OF 2 BOTTLES (BACTERIA AND YEAST)
BLOOD CULTURE, ROUTINE: NO GROWTH
BLOOD CULTURE, ROUTINE: NO GROWTH

## 2024-04-05 LAB — HYPERSENSITIVITY PNEUMONITIS PANEL, IGG, SERUM
ASPERGILLUS FUMIGATUS: NEGATIVE
MICROPOLYSPORA FAENI: NEGATIVE
PIGEON SERUM: NEGATIVE
S. VIRIDIS: NEGATIVE
T. CANDIDUS: NEGATIVE
T. VULGARIS: NEGATIVE

## 2024-04-26 LAB — FUNGUS CULTURE

## 2024-05-03 DEATH — deceased
